# Patient Record
Sex: Female | Born: 1935 | Race: White | Hispanic: No | Marital: Married | Smoking: Never smoker
Health system: Southern US, Community
[De-identification: ages and names within clinical notes are randomized; demographics above are authoritative.]

## PROBLEM LIST (undated history)

## (undated) DIAGNOSIS — I509 Heart failure, unspecified: Secondary | ICD-10-CM

## (undated) DIAGNOSIS — I428 Other cardiomyopathies: Secondary | ICD-10-CM

## (undated) DIAGNOSIS — I5042 Chronic combined systolic (congestive) and diastolic (congestive) heart failure: Secondary | ICD-10-CM

## (undated) DIAGNOSIS — F0390 Unspecified dementia without behavioral disturbance: Secondary | ICD-10-CM

## (undated) DIAGNOSIS — E871 Hypo-osmolality and hyponatremia: Secondary | ICD-10-CM

## (undated) DIAGNOSIS — N183 Chronic kidney disease, stage 3 unspecified: Secondary | ICD-10-CM

## (undated) DIAGNOSIS — N3281 Overactive bladder: Secondary | ICD-10-CM

## (undated) DIAGNOSIS — I10 Essential (primary) hypertension: Secondary | ICD-10-CM

## (undated) DIAGNOSIS — I2510 Atherosclerotic heart disease of native coronary artery without angina pectoris: Secondary | ICD-10-CM

## (undated) DIAGNOSIS — I4891 Unspecified atrial fibrillation: Secondary | ICD-10-CM

## (undated) DIAGNOSIS — M1990 Unspecified osteoarthritis, unspecified site: Secondary | ICD-10-CM

## (undated) DIAGNOSIS — I4821 Permanent atrial fibrillation: Secondary | ICD-10-CM

## (undated) HISTORY — DX: Other cardiomyopathies: I42.8

## (undated) HISTORY — DX: Overactive bladder: N32.81

## (undated) HISTORY — PX: ABDOMINAL HYSTERECTOMY: SHX81

## (undated) HISTORY — DX: Atherosclerotic heart disease of native coronary artery without angina pectoris: I25.10

## (undated) HISTORY — DX: Hypo-osmolality and hyponatremia: E87.1

## (undated) HISTORY — DX: Unspecified atrial fibrillation: I48.91

## (undated) HISTORY — DX: Essential (primary) hypertension: I10

---

## 1978-10-02 HISTORY — PX: SPINE SURGERY: SHX786

## 1999-01-13 ENCOUNTER — Other Ambulatory Visit: Admission: RE | Admit: 1999-01-13 | Discharge: 1999-01-13 | Payer: Self-pay | Admitting: *Deleted

## 2004-03-03 ENCOUNTER — Other Ambulatory Visit: Payer: Self-pay

## 2004-11-22 ENCOUNTER — Ambulatory Visit: Payer: Self-pay | Admitting: Family Medicine

## 2005-05-16 ENCOUNTER — Ambulatory Visit: Payer: Self-pay | Admitting: Internal Medicine

## 2005-08-04 ENCOUNTER — Ambulatory Visit: Payer: Self-pay | Admitting: Family Medicine

## 2005-08-23 ENCOUNTER — Ambulatory Visit: Payer: Self-pay | Admitting: Family Medicine

## 2006-08-16 ENCOUNTER — Ambulatory Visit: Payer: Self-pay | Admitting: Family Medicine

## 2006-09-14 ENCOUNTER — Ambulatory Visit: Payer: Self-pay | Admitting: Family Medicine

## 2006-11-08 ENCOUNTER — Ambulatory Visit: Payer: Self-pay | Admitting: Family Medicine

## 2006-11-15 ENCOUNTER — Ambulatory Visit: Payer: Self-pay | Admitting: Family Medicine

## 2006-11-15 LAB — CONVERTED CEMR LAB
Alkaline Phosphatase: 63 units/L (ref 39–117)
Basophils Absolute: 0 10*3/uL (ref 0.0–0.1)
Basophils Relative: 0.6 % (ref 0.0–1.0)
Chloride: 108 meq/L (ref 96–112)
Cholesterol: 169 mg/dL (ref 0–200)
Creatinine, Ser: 0.7 mg/dL (ref 0.4–1.2)
Eosinophils Absolute: 0.5 10*3/uL (ref 0.0–0.6)
Eosinophils Relative: 7.1 % — ABNORMAL HIGH (ref 0.0–5.0)
Glucose, Bld: 91 mg/dL (ref 70–99)
Hemoglobin: 13.5 g/dL (ref 12.0–15.0)
LDL Cholesterol: 82 mg/dL (ref 0–99)
Lymphocytes Relative: 20.3 % (ref 12.0–46.0)
MCV: 91.3 fL (ref 78.0–100.0)
Monocytes Absolute: 0.6 10*3/uL (ref 0.2–0.7)
Neutrophils Relative %: 62.9 % (ref 43.0–77.0)
Potassium: 3.5 meq/L (ref 3.5–5.1)
RDW: 12.5 % (ref 11.5–14.6)
Sodium: 142 meq/L (ref 135–145)
Total Bilirubin: 0.8 mg/dL (ref 0.3–1.2)
Total Protein: 5.7 g/dL — ABNORMAL LOW (ref 6.0–8.3)
Triglycerides: 67 mg/dL (ref 0–149)
VLDL: 13 mg/dL (ref 0–40)

## 2006-11-27 ENCOUNTER — Encounter: Admission: RE | Admit: 2006-11-27 | Discharge: 2006-11-27 | Payer: Self-pay | Admitting: Family Medicine

## 2006-12-25 ENCOUNTER — Ambulatory Visit: Payer: Self-pay | Admitting: Family Medicine

## 2007-02-08 ENCOUNTER — Ambulatory Visit: Payer: Self-pay | Admitting: Family Medicine

## 2007-03-01 DIAGNOSIS — M810 Age-related osteoporosis without current pathological fracture: Secondary | ICD-10-CM

## 2007-03-01 DIAGNOSIS — I10 Essential (primary) hypertension: Secondary | ICD-10-CM | POA: Insufficient documentation

## 2007-03-01 DIAGNOSIS — M1990 Unspecified osteoarthritis, unspecified site: Secondary | ICD-10-CM

## 2007-03-04 ENCOUNTER — Ambulatory Visit: Payer: Self-pay | Admitting: Family Medicine

## 2007-08-06 ENCOUNTER — Ambulatory Visit: Payer: Self-pay | Admitting: Family Medicine

## 2007-09-04 ENCOUNTER — Telehealth (INDEPENDENT_AMBULATORY_CARE_PROVIDER_SITE_OTHER): Payer: Self-pay | Admitting: *Deleted

## 2007-09-05 ENCOUNTER — Ambulatory Visit: Payer: Self-pay | Admitting: Family Medicine

## 2007-09-05 DIAGNOSIS — R609 Edema, unspecified: Secondary | ICD-10-CM

## 2007-09-05 DIAGNOSIS — M79609 Pain in unspecified limb: Secondary | ICD-10-CM

## 2007-12-30 ENCOUNTER — Ambulatory Visit: Payer: Self-pay | Admitting: Family Medicine

## 2008-01-02 ENCOUNTER — Encounter (INDEPENDENT_AMBULATORY_CARE_PROVIDER_SITE_OTHER): Payer: Self-pay | Admitting: *Deleted

## 2008-01-09 ENCOUNTER — Encounter (INDEPENDENT_AMBULATORY_CARE_PROVIDER_SITE_OTHER): Payer: Self-pay | Admitting: *Deleted

## 2008-01-13 ENCOUNTER — Ambulatory Visit: Payer: Self-pay | Admitting: Family Medicine

## 2008-01-13 ENCOUNTER — Encounter (INDEPENDENT_AMBULATORY_CARE_PROVIDER_SITE_OTHER): Payer: Self-pay | Admitting: *Deleted

## 2008-01-20 ENCOUNTER — Telehealth (INDEPENDENT_AMBULATORY_CARE_PROVIDER_SITE_OTHER): Payer: Self-pay | Admitting: *Deleted

## 2008-01-21 ENCOUNTER — Ambulatory Visit: Payer: Self-pay | Admitting: Family Medicine

## 2008-01-21 DIAGNOSIS — L259 Unspecified contact dermatitis, unspecified cause: Secondary | ICD-10-CM | POA: Insufficient documentation

## 2008-01-23 ENCOUNTER — Encounter: Admission: RE | Admit: 2008-01-23 | Discharge: 2008-01-23 | Payer: Self-pay | Admitting: Family Medicine

## 2008-01-23 ENCOUNTER — Encounter (INDEPENDENT_AMBULATORY_CARE_PROVIDER_SITE_OTHER): Payer: Self-pay | Admitting: *Deleted

## 2008-01-28 ENCOUNTER — Encounter (INDEPENDENT_AMBULATORY_CARE_PROVIDER_SITE_OTHER): Payer: Self-pay | Admitting: *Deleted

## 2008-01-28 ENCOUNTER — Encounter: Admission: RE | Admit: 2008-01-28 | Discharge: 2008-01-28 | Payer: Self-pay | Admitting: Family Medicine

## 2008-01-28 ENCOUNTER — Encounter: Payer: Self-pay | Admitting: Family Medicine

## 2008-07-15 ENCOUNTER — Ambulatory Visit: Payer: Self-pay | Admitting: Family Medicine

## 2009-02-08 ENCOUNTER — Encounter: Admission: RE | Admit: 2009-02-08 | Discharge: 2009-02-08 | Payer: Self-pay | Admitting: Family Medicine

## 2009-04-15 ENCOUNTER — Ambulatory Visit: Payer: Self-pay | Admitting: Family Medicine

## 2009-04-18 LAB — CONVERTED CEMR LAB
Albumin: 3.7 g/dL (ref 3.5–5.2)
Alkaline Phosphatase: 54 units/L (ref 39–117)
Calcium: 9.3 mg/dL (ref 8.4–10.5)
Chloride: 105 meq/L (ref 96–112)
LDL Cholesterol: 86 mg/dL (ref 0–99)
Potassium: 3.8 meq/L (ref 3.5–5.1)
Total CHOL/HDL Ratio: 2
Triglycerides: 51 mg/dL (ref 0.0–149.0)
VLDL: 10.2 mg/dL (ref 0.0–40.0)
Vit D, 25-Hydroxy: 45 ng/mL (ref 30–89)

## 2009-04-19 ENCOUNTER — Encounter (INDEPENDENT_AMBULATORY_CARE_PROVIDER_SITE_OTHER): Payer: Self-pay | Admitting: *Deleted

## 2009-04-23 ENCOUNTER — Encounter: Payer: Self-pay | Admitting: Family Medicine

## 2009-04-23 ENCOUNTER — Encounter: Admission: RE | Admit: 2009-04-23 | Discharge: 2009-04-23 | Payer: Self-pay | Admitting: Family Medicine

## 2009-05-11 ENCOUNTER — Encounter: Payer: Self-pay | Admitting: Family Medicine

## 2009-06-24 ENCOUNTER — Telehealth: Payer: Self-pay | Admitting: Family Medicine

## 2009-06-28 ENCOUNTER — Ambulatory Visit: Payer: Self-pay | Admitting: Family Medicine

## 2009-06-28 LAB — CONVERTED CEMR LAB
Bilirubin Urine: NEGATIVE
Blood in Urine, dipstick: NEGATIVE
Ketones, urine, test strip: NEGATIVE
Nitrite: NEGATIVE
Specific Gravity, Urine: 1.01
Urobilinogen, UA: 0.2

## 2009-07-01 ENCOUNTER — Telehealth (INDEPENDENT_AMBULATORY_CARE_PROVIDER_SITE_OTHER): Payer: Self-pay | Admitting: *Deleted

## 2009-07-02 ENCOUNTER — Ambulatory Visit: Payer: Self-pay | Admitting: Family Medicine

## 2009-07-12 ENCOUNTER — Telehealth (INDEPENDENT_AMBULATORY_CARE_PROVIDER_SITE_OTHER): Payer: Self-pay | Admitting: *Deleted

## 2009-10-14 ENCOUNTER — Telehealth (INDEPENDENT_AMBULATORY_CARE_PROVIDER_SITE_OTHER): Payer: Self-pay | Admitting: *Deleted

## 2010-01-31 ENCOUNTER — Telehealth: Payer: Self-pay | Admitting: Family Medicine

## 2010-02-02 ENCOUNTER — Telehealth (INDEPENDENT_AMBULATORY_CARE_PROVIDER_SITE_OTHER): Payer: Self-pay | Admitting: *Deleted

## 2010-02-10 ENCOUNTER — Encounter: Admission: RE | Admit: 2010-02-10 | Discharge: 2010-02-10 | Payer: Self-pay | Admitting: Family Medicine

## 2010-02-10 LAB — HM MAMMOGRAPHY

## 2010-02-18 ENCOUNTER — Ambulatory Visit: Payer: Self-pay | Admitting: Family Medicine

## 2010-02-22 ENCOUNTER — Telehealth (INDEPENDENT_AMBULATORY_CARE_PROVIDER_SITE_OTHER): Payer: Self-pay | Admitting: *Deleted

## 2010-03-11 ENCOUNTER — Ambulatory Visit: Payer: Self-pay | Admitting: Family Medicine

## 2010-03-11 ENCOUNTER — Encounter (INDEPENDENT_AMBULATORY_CARE_PROVIDER_SITE_OTHER): Payer: Self-pay | Admitting: *Deleted

## 2010-03-22 ENCOUNTER — Ambulatory Visit: Payer: Self-pay | Admitting: Family Medicine

## 2010-03-22 DIAGNOSIS — K921 Melena: Secondary | ICD-10-CM

## 2010-03-23 ENCOUNTER — Encounter (INDEPENDENT_AMBULATORY_CARE_PROVIDER_SITE_OTHER): Payer: Self-pay | Admitting: *Deleted

## 2010-03-23 ENCOUNTER — Telehealth (INDEPENDENT_AMBULATORY_CARE_PROVIDER_SITE_OTHER): Payer: Self-pay | Admitting: *Deleted

## 2010-03-23 LAB — CONVERTED CEMR LAB: Fecal Occult Bld: POSITIVE

## 2010-03-29 ENCOUNTER — Telehealth: Payer: Self-pay | Admitting: Family Medicine

## 2010-05-03 ENCOUNTER — Ambulatory Visit: Payer: Self-pay | Admitting: Gastroenterology

## 2010-05-03 ENCOUNTER — Encounter (INDEPENDENT_AMBULATORY_CARE_PROVIDER_SITE_OTHER): Payer: Self-pay | Admitting: *Deleted

## 2010-05-03 DIAGNOSIS — R195 Other fecal abnormalities: Secondary | ICD-10-CM

## 2010-05-09 ENCOUNTER — Ambulatory Visit: Payer: Self-pay | Admitting: Gastroenterology

## 2010-06-27 ENCOUNTER — Ambulatory Visit: Payer: Self-pay | Admitting: Family Medicine

## 2010-06-27 ENCOUNTER — Telehealth (INDEPENDENT_AMBULATORY_CARE_PROVIDER_SITE_OTHER): Payer: Self-pay | Admitting: *Deleted

## 2010-07-06 ENCOUNTER — Telehealth (INDEPENDENT_AMBULATORY_CARE_PROVIDER_SITE_OTHER): Payer: Self-pay | Admitting: *Deleted

## 2010-08-17 ENCOUNTER — Ambulatory Visit: Payer: Self-pay | Admitting: Family Medicine

## 2010-10-23 ENCOUNTER — Encounter: Payer: Self-pay | Admitting: Family Medicine

## 2010-10-30 LAB — CONVERTED CEMR LAB
ALT: 11 units/L (ref 0–35)
AST: 19 units/L (ref 0–37)
AST: 20 units/L (ref 0–37)
Albumin: 3.5 g/dL (ref 3.5–5.2)
Albumin: 3.6 g/dL (ref 3.5–5.2)
Alkaline Phosphatase: 43 units/L (ref 39–117)
Alkaline Phosphatase: 55 units/L (ref 39–117)
BUN: 12 mg/dL (ref 6–23)
BUN: 14 mg/dL (ref 6–23)
Basophils Absolute: 0.1 10*3/uL (ref 0.0–0.1)
Basophils Absolute: 0.1 10*3/uL (ref 0.0–0.1)
Basophils Absolute: 0.1 10*3/uL (ref 0.0–0.1)
Basophils Relative: 0.7 % (ref 0.0–3.0)
Basophils Relative: 0.8 % (ref 0.0–3.0)
Bilirubin, Direct: 0.1 mg/dL (ref 0.0–0.3)
Bilirubin, Direct: 0.1 mg/dL (ref 0.0–0.3)
Blood in Urine, dipstick: NEGATIVE
CO2: 29 meq/L (ref 19–32)
Calcium: 9.3 mg/dL (ref 8.4–10.5)
Chloride: 105 meq/L (ref 96–112)
Cholesterol: 196 mg/dL (ref 0–200)
Cholesterol: 220 mg/dL — ABNORMAL HIGH (ref 0–200)
Creatinine, Ser: 0.7 mg/dL (ref 0.4–1.2)
Creatinine, Ser: 0.7 mg/dL (ref 0.4–1.2)
Creatinine, Ser: 0.7 mg/dL (ref 0.4–1.2)
Eosinophils Absolute: 0.3 10*3/uL (ref 0.0–0.7)
Eosinophils Absolute: 0.4 10*3/uL (ref 0.0–0.7)
Eosinophils Relative: 3.9 % (ref 0.0–5.0)
Eosinophils Relative: 4 % (ref 0.0–5.0)
GFR calc non Af Amer: 80.14 mL/min (ref >60)
GFR calc non Af Amer: 86 mL/min
GFR calc non Af Amer: 86.94 mL/min (ref >60)
Glucose, Bld: 100 mg/dL — ABNORMAL HIGH (ref 70–99)
Glucose, Urine, Semiquant: NEGATIVE
HDL: 87 mg/dL (ref >39.00)
Hemoglobin: 13.8 g/dL (ref 12.0–15.0)
Lymphocytes Relative: 18.1 % (ref 12.0–46.0)
Lymphocytes Relative: 18.7 % (ref 12.0–46.0)
Lymphs Abs: 1.4 10*3/uL (ref 0.7–4.0)
Lymphs Abs: 1.6 10*3/uL (ref 0.7–4.0)
MCHC: 32.4 g/dL (ref 30.0–36.0)
MCHC: 34.4 g/dL (ref 30.0–36.0)
Monocytes Absolute: 0.6 10*3/uL (ref 0.1–1.0)
Monocytes Absolute: 0.7 10*3/uL (ref 0.1–1.0)
Monocytes Relative: 8.2 % (ref 3.0–12.0)
Monocytes Relative: 8.3 % (ref 3.0–12.0)
Monocytes Relative: 8.7 % (ref 3.0–12.0)
Neutrophils Relative %: 67.8 % (ref 43.0–77.0)
Neutrophils Relative %: 68.3 % (ref 43.0–77.0)
Neutrophils Relative %: 70.1 % (ref 43.0–77.0)
Nitrite: NEGATIVE
Platelets: 287 10*3/uL (ref 150–400)
Potassium: 4 meq/L (ref 3.5–5.1)
Protein, U semiquant: NEGATIVE
RBC: 4.34 M/uL (ref 3.87–5.11)
RDW: 14.1 % (ref 11.5–14.6)
Sodium: 140 meq/L (ref 135–145)
Sodium: 142 meq/L (ref 135–145)
Sodium: 143 meq/L (ref 135–145)
Specific Gravity, Urine: <1.005
TSH: 0.45 microintl units/mL (ref 0.35–5.50)
Total Bilirubin: 0.8 mg/dL (ref 0.3–1.2)
Total CHOL/HDL Ratio: 2.4
Triglycerides: 60 mg/dL (ref 0–149)
Urobilinogen, UA: NEGATIVE
VLDL: 14.4 mg/dL (ref 0.0–40.0)
Vit D, 1,25-Dihydroxy: 36 (ref 30–89)
Vit D, 25-Hydroxy: 45 ng/mL (ref 30–89)
Vit D, 25-Hydroxy: 47 ng/mL (ref 30–89)
WBC Urine, dipstick: NEGATIVE

## 2010-11-01 NOTE — Assessment & Plan Note (Signed)
Summary: prolia injection//lch   Nurse Visit   Allergies: No Known Drug Allergies  Medication Administration  Injection # 1:    Medication: Prolia 60mg     Diagnosis: SENILE OSTEOPOROSIS (ICD-733.01)    Route: SQ    Site: R deltoid    Exp Date: 08/2011    Lot #: [id number]    Mfr: amgen    Patient tolerated injection without complications    Given by: Army Fossa CMA (Feb 18, 2010 1:50 PM)  Orders Added: 1)  Admin of Therapeutic Inj  intramuscular or subcutaneous [96372] 2)  Prolia 60mg  [J3590]

## 2010-11-01 NOTE — Letter (Signed)
Summary: [facility] Instructions  [facility]  [address] [place], [place] [address]   Phone: [phone]  Fax: [phone]       [name]    [birth year]    MRN: [id number]      Procedure Day Dorna Bloom:  Duanne Limerick, 05/09/10     Arrival Time: 3:00 PM      Procedure Time: 4:00 PM    Location of Procedure:                    _X_  [facility] (4th Floor)  PREPARATION FOR COLONOSCOPY WITH MOVIPREP   Starting 5 days prior to your procedure 05/04/10 do not eat nuts, seeds, popcorn, corn, beans, peas,  salads, or any raw vegetables.  Do not take any fiber supplements (e.g. Metamucil, Citrucel, and Benefiber).  THE DAY BEFORE YOUR PROCEDURE         SUNDAY, 05/08/10  1.  Drink clear liquids the entire day-NO SOLID FOOD  2.  Do not drink anything colored red or purple.  Avoid juices with pulp.  No orange juice.  3.  Drink at least 64 oz. (8 glasses) of fluid/clear liquids during the day to prevent dehydration and help the prep work efficiently.  CLEAR LIQUIDS INCLUDE: Water Jello Ice Popsicles Tea (sugar ok, no milk/cream) Powdered fruit flavored drinks Coffee (sugar ok, no milk/cream) Gatorade Juice: apple, white grape, white cranberry  Lemonade Clear bullion, consomm, broth Carbonated beverages (any kind) Strained chicken noodle soup Hard Candy                             4.  In the morning, mix first dose of MoviPrep solution:    Empty 1 Pouch A and 1 Pouch B into the disposable container    Add lukewarm drinking water to the top line of the container. Mix to dissolve    Refrigerate (mixed solution should be used within 24 hrs)  5.  Begin drinking the prep at 5:00 p.m. The MoviPrep container is divided by 4 marks.   Every 15 minutes drink the solution down to the next mark (approximately 8 oz) until the full liter is complete.   6.  Follow completed prep with 16 oz of clear liquid of your choice (Nothing red or purple).  Continue to drink clear  liquids until bedtime.  7.  Before going to bed, mix second dose of MoviPrep solution:    Empty 1 Pouch A and 1 Pouch B into the disposable container    Add lukewarm drinking water to the top line of the container. Mix to dissolve    Refrigerate  THE DAY OF YOUR PROCEDURE      MONDAY, 05/09/10  Beginning at 11:00 a.m. (5 hours before procedure):         1. Every 15 minutes, drink the solution down to the next mark (approx 8 oz) until the full liter is complete.  2. Follow completed prep with 16 oz. of clear liquid of your choice.    3. You may drink clear liquids until 2:00 PM (2 HOURS BEFORE PROCEDURE).  MEDICATION INSTRUCTIONS  Unless otherwise instructed, you should take regular prescription medications with a small sip of water   as early as possible the morning of your procedure.       OTHER INSTRUCTIONS  You will need a responsible adult at least 75 years of age to accompany you and drive you home.  This person must remain in the waiting room during your procedure.  Wear loose fitting clothing that is easily removed.  Leave jewelry and other valuables at home.  However, you may wish to bring a book to read or  an iPod/MP3 player to listen to music as you wait for your procedure to start.  Remove all body piercing jewelry and leave at home.  Total time from sign-in until discharge is approximately 2-3 hours.  You should go home directly after your procedure and rest.  You can resume normal activities the  day after your procedure.  The day of your procedure you should not:   Drive   Make legal decisions   Operate machinery   Drink alcohol   Return to work  You will receive specific instructions about eating, activities and medications before you leave.  The above instructions have been reviewed and explained to me by   Francee Piccolo, CMA (AAMA)    I fully understand and can verbalize these instructions _____________________________ Date  _________

## 2010-11-01 NOTE — Procedures (Signed)
Summary: Colonoscopy  Patient: [name] Note: All result statuses are Final unless otherwise noted.  Tests: (1) Colonoscopy (COL)   COL Colonoscopy           DONE     [facility]     [address]     [place], [place]  [address]           COLONOSCOPY PROCEDURE REPORT           PATIENT:  [name], [name]  MR#:  [id number]     BIRTHDATE:  21-Sep-1928, 82 yrs. old  GENDER:  female     ENDOSCOPIST:  Rachael Fee, MD     REF. BY:  [name], DO     PROCEDURE DATE:  05/09/2010     PROCEDURE:  Diagnostic Colonoscopy     ASA CLASS:  Class II     INDICATIONS:  FOBT positive stool     MEDICATIONS:   Fentanyl 25 mcg IV, Versed 4 mg IV           DESCRIPTION OF PROCEDURE:   After the risks benefits and     alternatives of the procedure were thoroughly explained, informed     consent was obtained.  Digital rectal exam was performed and     revealed no rectal masses.   The LB [id number]-[id number] endoscope     was introduced through the anus and advanced to the cecum, which     was identified by both the appendix and ileocecal valve, without     limitations.  The quality of the prep was good, using MoviPrep.     The instrument was then slowly withdrawn as the colon was fully     examined.     <<PROCEDUREIMAGES>>           FINDINGS:  Moderate diverticulosis was found in the sigmoid to     descending colon segments (see image1).  Internal and external     hemorrhoids were found.  This was otherwise a normal examination     of the colon (see image2, image3, and image4).   Retroflexed views     in the rectum revealed no abnormalities.    The scope was then     withdrawn from the patient and the procedure completed.           COMPLICATIONS:  None     ENDOSCOPIC IMPRESSION:     1) Moderate diverticulosis in the sigmoid to descending colon     segments     2) Internal and external hemorrhoids     3) Otherwise normal examination.  No polyps or cancers.            RECOMMENDATIONS:     1) Given your age, you will not need another colonoscopy for     colon cancer screening or polyp surveillance. These types of tests     usually stop around the age 41.           ______________________________     Rachael Fee, MD           n.     eSIGNED:   Rachael Fee at 05/09/2010 04:05 PM           [name], [name], [id number]  Note: An exclamation mark (!) indicates a result that was not dispersed into the flowsheet. Document Creation Date: 05/09/2010 4:07 PM _______________________________________________________________________  (1) Order result status: Final Collection or observation date-time: 05/09/2010 16:02 Requested date-time:  Receipt  date-time:  Reported date-time:  Referring Physician:   Ordering Physician: Rob Bunting [phone]) Specimen Source:  Source: Launa Grill Order Number: [id number] Lab site:

## 2010-11-01 NOTE — Assessment & Plan Note (Signed)
Summary: yearly check/cbs   Vital Signs:  Patient profile:   77 year old female Height:      64 inches Weight:      127.38 pounds BMI:     21.94 Temp:     97.9 degrees F oral Pulse rate:   76 / minute Pulse rhythm:   regular BP sitting:   118 / 76  (left arm) Cuff size:   regular  Vitals Entered By: Army Fossa CMA (March 11, 2010 9:00 AM) CC: Pt here yearly check. Is Patient Diabetic? No Pain Assessment Patient in pain? yes     Location: knee Intensity: 8 Type: aching Onset of pain  Chronic Nutritional Status BMI of 19 -24 = normal  Does patient need assistance? Functional Status Self care, Cook/clean, Shopping, Social activities Ambulation Impaired:Risk for fall Comments secondary to osteoporosis pt walks hunched over--- at risk for tripping and falling   History of Present Illness: Pt here for cpe and labs.  Pt is having OA pain but walks the dog.   Hypertension follow-up      This is an 77 year old woman who presents for Hypertension follow-up.  The patient denies lightheadedness, urinary frequency, headaches, edema, impotence, rash, and fatigue.  The patient denies the following associated symptoms: chest pain, chest pressure, exercise intolerance, dyspnea, palpitations, syncope, leg edema, and pedal edema.  Compliance with medications (by patient report) has been near 100%.  The patient reports that dietary compliance has been good.  The patient reports no exercise.  Adjunctive measures currently used by the patient include salt restriction.    Preventive Screening-Counseling & Management  Alcohol-Tobacco     Alcohol drinks/day: <1     Alcohol type: occas  wine     Smoking Status: never     Passive Smoke Exposure: no  Caffeine-Diet-Exercise     Caffeine use/day: 4     Does Patient Exercise: yes     Type of exercise: walks     Times/week: <3     Depression Counseling: not indicated; screening negative for depression  Hep-HIV-STD-Contraception     HIV  Risk: no     Dental Visit-last 6 months dentures     SBE monthly: yes  Safety-Violence-Falls     Seat Belt Use: 100     Smoke Detectors: yes     Violence in the Home: no risk noted     Sexual Abuse: no     Fall Risk: concerned about pt getting tired and falling      Drug Use:  never.    Current Medications (verified): 1)  Norvasc 5 Mg  Tabs (Amlodipine Besylate) .... Take One Tablet By Mouth Daily**office Visit Due Now** 2)  Caltrate 600 1500 Mg Tabs (Calcium Carbonate) .Marland Kitchen.. 1 By Mouth Two Times A Day 3)  Vesicare 5 Mg Tabs (Solifenacin Succinate) .Marland Kitchen.. 1 By Mouth Once Daily 4)  Prolia 60 Mg/ml Soln (Denosumab) .... 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 Family History Depression Family History Heart Attack Family History of CAD Female 1st degree relative M-69 Son-- MI in 87 s  Social History: Reviewed history from 12/30/2007 and no changes required. 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 Eyes:  Denies blurring, discharge, double vision, eye irritation, eye pain, halos, itching, light sensitivity, red eye, vision loss-1 eye, and vision loss-both eyes; + Optho q1y. ENT:  Complains of decreased hearing; denies difficulty swallowing, ear discharge, earache, hoarseness, nasal congestion, nosebleeds, postnasal drainage, ringing in ears, sinus pressure, and sore throat; + hearing aids . CV:  Denies bluish discoloration of lips or nails, chest pain or discomfort, difficulty breathing at night, difficulty breathing while lying down, fainting, fatigue, leg cramps with exertion, lightheadness, near fainting, palpitations, shortness of breath with exertion, swelling of hands, and weight gain; pt states swelling improved since sitting with legs up. Resp:  Denies chest discomfort, chest pain with inspiration, cough, coughing up blood, excessive snoring, hypersomnolence, morning headaches, pleuritic, shortness of breath, sputum productive, and wheezing. GI:  Denies abdominal pain, bloody stools, change in bowel habits, constipation, dark tarry stools, diarrhea, excessive appetite, gas, hemorrhoids, indigestion, loss of appetite, and nausea. GU:  Denies abnormal vaginal bleeding, decreased libido, discharge, dysuria, genital sores, hematuria, incontinence, nocturia, urinary frequency, and urinary hesitancy. MS:  Complains of joint pain; denies joint redness, joint swelling, loss of strength, low back pain, mid back pain, muscle aches, muscle , cramps, muscle weakness, stiffness, and thoracic pain; pt c/o b/l knee pain secondary to oA. Derm:  Denies changes in color of skin, changes in nail beds, dryness, excessive perspiration, flushing, hair loss, insect bite(s), itching, lesion(s), poor wound healing, and  rash. Neuro:  Denies brief paralysis, difficulty with concentration, disturbances in coordination, falling down, headaches, inability to speak, memory loss, numbness, poor balance, seizures, sensation of room spinning, tingling, tremors, and visual disturbances. Psych:  Denies alternate hallucination ( auditory/visual), anxiety, depression, easily angered, easily tearful, irritability, mental problems, panic attacks, sense of great danger, suicidal thoughts/plans, thoughts of violence, unusual visions or sounds, and thoughts /plans of harming others. Endo:  Denies cold intolerance, excessive hunger, excessive thirst, excessive urination, heat intolerance, polyuria, and weight change. Heme:  Denies abnormal bruising, bleeding, enlarge lymph nodes, fevers, pallor, and skin discoloration. Allergy:  Denies hives or rash, itching eyes, persistent infections, seasonal allergies, and sneezing.   Physical Exam  General:  Well-developed,well-nourished,in no acute distress; alert,appropriate and cooperative throughout examination Head:  Normocephalic and atraumatic without obvious abnormalities. No apparent alopecia or balding. Eyes:  pupils equal, pupils round, pupils reactive to light, and no injection.   + glasses-- pt sees optho q1y Ears:  + hearing aids --hearing normal with whisper from 6 feet Nose:  External nasal examination shows no deformity or inflammation. Nasal mucosa are pink and moist without lesions or exudates. Mouth:  Oral mucosa and oropharynx without lesions or exudates.  Teeth in good repair. Neck:  No deformities, masses, or tenderness noted.no carotid bruits.   Chest Wall:  No deformities, masses, or tenderness  noted. Breasts:  No mass, nodules, thickening, tenderness, bulging, retraction, inflamation, nipple discharge or skin changes noted.   Lungs:  Normal respiratory effort, chest expands symmetrically. Lungs are clear to auscultation, no crackles or wheezes. Heart:  normal rate  and no murmur.   Abdomen:  Bowel sounds positive,abdomen soft and non-tender without masses, organomegaly or hernias noted. Rectal:  refused-- ifob given Genitalia:  refused Msk:  b/l knee painno joint warmth and no redness over joints.   Pulses:  R posterior tibial normal, R dorsalis pedis normal, R carotid normal, L posterior tibial normal, L dorsalis pedis normal, and L carotid normal.   Extremities:  No clubbing, cyanosis, edema, or deformity noted with normal full range of motion of all joints.   Neurologic:  alert & oriented X3 and cranial nerves II-XII intact. Pt walks bent over --with no cane/ walker some weakness in legs but othewise good strength   Skin:  Intact without suspicious lesions or rashes Cervical Nodes:  No lymphadenopathy noted Axillary Nodes:  No palpable lymphadenopathy Psych:  Oriented X3 and normally interactive.     Impression & Recommendations:  Problem # 1:  PREVENTIVE HEALTH CARE (ICD-V70.0)  Orders: Venipuncture (16109) TLB-Lipid Panel (80061-LIPID) TLB-BMP (Basic Metabolic Panel-BMET) (80048-METABOL) TLB-CBC Platelet - w/Differential (85025-CBCD) TLB-Hepatic/Liver Function Pnl (80076-HEPATIC) T-Vitamin D (25-Hydroxy) (60454-09811) UA Dipstick w/o Micro (manual) (91478) First annual wellness visit with prevention plan  (G9562) EKG w/ Interpretation (93000)  Problem # 2:  SENILE OSTEOPOROSIS (ICD-733.01)  Her updated medication list for this problem includes:    Caltrate 600 1500 Mg Tabs (Calcium carbonate) .Marland Kitchen... 1 by mouth two times a day    Prolia 60 Mg/ml Soln (Denosumab) ..... Q 6 months  Orders: Venipuncture (13086) TLB-Lipid Panel (80061-LIPID) TLB-BMP (Basic Metabolic Panel-BMET) (80048-METABOL) TLB-CBC Platelet - w/Differential (85025-CBCD) TLB-Hepatic/Liver Function Pnl (80076-HEPATIC) T-Vitamin D (25-Hydroxy) (57846-96295)  Bone Density: abnormal-- osteoporosis (05/11/2009) Vit D:45 (04/15/2009), 36 (12/30/2007)  Problem # 3:   OSTEOARTHRITIS (ICD-715.90)  Orders: Venipuncture (28413) TLB-Lipid Panel (80061-LIPID) TLB-BMP (Basic Metabolic Panel-BMET) (80048-METABOL) TLB-CBC Platelet - w/Differential (85025-CBCD) TLB-Hepatic/Liver Function Pnl (80076-HEPATIC) T-Vitamin D (25-Hydroxy) (24401-02725)  Discussed use of medications, application of heat or cold, and exercises.   Problem # 4:  HYPERTENSION (ICD-401.9)  Her updated medication list for this problem includes:    Norvasc 5 Mg Tabs (Amlodipine besylate) .Marland Kitchen... Take one tablet by mouth daily**office visit due now**  Orders: Venipuncture (36644) TLB-Lipid Panel (80061-LIPID) TLB-BMP (Basic Metabolic Panel-BMET) (80048-METABOL) TLB-CBC Platelet - w/Differential (85025-CBCD) TLB-Hepatic/Liver Function Pnl (80076-HEPATIC) T-Vitamin D (25-Hydroxy) (03474-25956)  BP today: 118/76 Prior BP: 130/78 (07/02/2009)  Labs Reviewed: K+: 3.8 (04/15/2009) Creat: : 0.8 (04/15/2009)   Chol: 184 (04/15/2009)   HDL: 87.80 (04/15/2009)   LDL: 86 (04/15/2009)   TG: 51.0 (04/15/2009)  Complete Medication List: 1)  Norvasc 5 Mg Tabs (Amlodipine besylate) .... Take one tablet by mouth daily**office visit due now** 2)  Caltrate 600 1500 Mg Tabs (Calcium carbonate) .Marland Kitchen.. 1 by mouth two times a day 3)  Vesicare 5 Mg Tabs (Solifenacin succinate) .Marland Kitchen.. 1 by mouth once daily 4)  Prolia 60 Mg/ml Soln (Denosumab) .... Q 6 months   EKG  Procedure date:  03/11/2010  Findings:      Normal sinus rhythm with rate of:  67 bpm    PAP Next Due:  Refused Bone Density Result Date:  05/11/2009 Bone Density Result:  abnormal-- osteoporosis Bone Density Next Due: 2 yr     Prevention & Chronic Care Immunizations   Influenza vaccine: Fluvax  MCR  (06/28/2009)   Influenza vaccine due: 06/28/2010    Tetanus booster: 10/02/2005: Td   Tetanus booster due: 10/03/2015    Pneumococcal vaccine: Pneumovax  (11/08/2006)   Pneumococcal vaccine due: None    H. zoster vaccine:  12/12/2006: Zostavax  Colorectal Screening   Hemoccult: Not documented   Hemoccult action/deferral: Ordered  (03/11/2010)    Colonoscopy: normal per pt--  [facility]  (12/14/2004)   Colonoscopy due: 12/15/2014  Other Screening   Pap smear: Not documented   Pap smear action/deferral: Refused  (03/11/2010)   Pap smear due: Refused  (03/11/2010)    Mammogram: ASSESSMENT: Negative - BI-RADS 1^MM DIGITAL SCREENING  (02/10/2010)   Mammogram due: 02/11/2011    DXA bone density scan: abnormal-- osteoporosis  (05/11/2009)   DXA scan due: 05/12/2011    Smoking status: never  (03/11/2010)  Lipids   Total Cholesterol: 184  (04/15/2009)   LDL: 86  (04/15/2009)   LDL Direct: Not documented   HDL: 87.80  (04/15/2009)   Triglycerides: 51.0  (04/15/2009)  Hypertension   Last Blood Pressure: 118 / 76  (03/11/2010)   Serum creatinine: 0.8  (04/15/2009)   BMP action: Ordered   Serum potassium 3.8  (04/15/2009)    Hypertension flowsheet reviewed?: Yes   Progress toward BP goal: At goal  Self-Management Support :    Patient will work on the following items until the next clinic visit to reach self-care goals:     Medications and monitoring: take my medicines every day, bring all of my medications to every visit  (03/11/2010)     Eating: drink diet soda or water instead of juice or soda, eat more vegetables, use fresh or frozen vegetables, eat foods that are low in salt, eat baked foods instead of fried foods, eat fruit for snacks and desserts, limit or avoid alcohol  (03/11/2010)     Activity: take a 30 minute walk every day  (03/11/2010)    Hypertension self-management support: Not documented  Laboratory Results   Urine Tests   Date/Time Reported: March 11, 2010 11:18 AM   Routine Urinalysis   Color: yellow Appearance: Clear Glucose: negative   (Normal Range: Negative) Bilirubin: negative   (Normal Range: Negative) Ketone: negative   (Normal Range: Negative) Spec.  Gravity: <1.005   (Normal Range: 1.003-1.035) Blood: negative   (Normal Range: Negative) pH: 7.5   (Normal Range: 5.0-8.0) Protein: negative   (Normal Range: Negative) Urobilinogen: negative   (Normal Range: 0-1) Nitrite: negative   (Normal Range: Negative) Leukocyte Esterace: negative   (Normal Range: Negative)    Comments: Floydene Flock  March 11, 2010 11:19 AM       Appended Document: Orders Update     Clinical Lists Changes  Orders: Added new Service order of Prescription Created Electronically [phone]) - Signed

## 2010-11-01 NOTE — Assessment & Plan Note (Signed)
History of Present Illness Visit Type: consult  Primary GI MD: Rob Bunting MD Primary Provider: [name], DO  Requesting Provider: [name], DO  Chief Complaint: Heme positive stool  History of Present Illness:     very pleasant 77 year old woman who has been taking vesicare.  This causes constipation.  Severe, per patient.  Miralax helps, she stopped the vesicare.   At it's worst she would go once a week.  She's had no overt GI bleeding.   she had a recent routine physical and was found to have Hemoccult positive stool.  recent CBC shows that she is not anemic  She had a colonoscopy about 5-6 years ago.  This was probably at [facility].  Everything was "fine."  Dr. Maryruth Bun was the GI MD.           Current Medications (verified): 1)  Norvasc 5 Mg  Tabs (Amlodipine Besylate) .... Take One Tablet By Mouth Daily**office Visit Due Now** 2)  Caltrate 600 1500 Mg Tabs (Calcium Carbonate) .Marland Kitchen.. 1 By Mouth Two Times A Day 3)  Prolia 60 Mg/ml Soln (Denosumab) .... 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  All other review of systems was otherwise negative.   Vital Signs:  Patient profile:   77 year old female Height:      64 inches Weight:      122 pounds BMI:     21.02 BSA:     1.59 Pulse rate:   68 / minute Pulse rhythm:   regular BP sitting:   122 / 64   (left arm) Cuff size:   regular  Vitals Entered By: Ok Anis CMA (May 03, 2010 9:45 AM)  Physical Exam  Additional Exam:  Constitutional: generally well appearing Psychiatric: alert and oriented times 3 Eyes: extraocular movements intact Mouth: oropharynx moist, no lesions Neck: supple, no lymphadenopathy Cardiovascular: heart regular rate and rythm Lungs: CTA bilaterally Abdomen: soft, non-tender, non-distended, no obvious ascites, no peritoneal signs, normal bowel sounds Extremities: no lower extremity edema bilaterally Skin: no lesions on visible extremities    Impression & Recommendations:  Problem # 1:  Hemoccult-Positive stool we will proceed with colonoscopy at her soonest convenience.  Colorectal cancer screening usually stops around the age of 30 and so pending the results of the colonoscopy she may not need any further screening. I see no reason for any further blood tests or imaging studies at this point.  Patient Instructions: 1)  You will be scheduled to have a colonoscopy. 2)  A copy of this information will be sent to Dr. Laury Axon. 3)  The medication list was reviewed and reconciled.  All changed / newly prescribed medications were explained.  A complete medication list was provided to the patient / caregiver.  Appended Document: Orders Update Clinical Lists Changes  Problems: Added new problem of BLOOD IN STOOL, OCCULT (ICD-792.1) - Signed Medications: Added new medication  of MOVIPREP 100 GM  SOLR (PEG-KCL-NACL-NASULF-NA ASC-C) As per prep instructions. - Signed Rx of MOVIPREP 100 GM  SOLR (PEG-KCL-NACL-NASULF-NA ASC-C) As per prep instructions.;  #1 x 0;  Signed;  Entered by: Francee Piccolo CMA (AAMA);  Authorized by: Rachael Fee MD;  Method used: [facility] [phone]*, [address], [place], [place], [place]  [address], Ph: [phone], Fax: [phone] Orders: Added new Test order of Colonoscopy (Colon) -  Signed    Prescriptions: MOVIPREP 100 GM  SOLR (PEG-KCL-NACL-NASULF-NA ASC-C) As per prep instructions.  #1 x 0   Entered by:   Francee Piccolo CMA (AAMA)   Authorized by:   Rachael Fee MD   Signed by:   Francee Piccolo CMA (AAMA) on 05/03/2010   Method used:   Electronically to        [facility] [phone]* (retail)       [address]       [place], [place]  [address]       Ph: [phone]       Fax: [phone]   RxID:   [id number]

## 2010-11-01 NOTE — Progress Notes (Signed)
Summary: Prior Auth-needs to Detrol alternative  Phone Note Refill Request Call back at [phone] Message from:  Pharmacy on June 27, 2010 10:05 AM  Refills Requested: Medication #1:  DETROL LA 2 MG XR24H-CAP 1 by mouth once daily   Dosage confirmed as above?Dosage Confirmed [facility] [phone]  Initial call taken by: Harold Barban,  June 27, 2010 10:05 AM  Follow-up for Phone Call        patient called says that medication is $175 so she will need alternative medication list of alternatives are  oxybutynin er,enablex,vesicare,oxytrol .......Marland KitchenDoristine Devoid CMA  June 27, 2010 1:47 PM   Additional Follow-up for Phone Call Additional follow up Details #1::        oxybutynun xl 5 1 by mouth once daily #30  5 refills Additional Follow-up by: [name] DO,  June 27, 2010 2:18 PM    Additional Follow-up for Phone Call Additional follow up Details #2::    spoke w/ patient aware prescription changed and sent to pharmacy.....Marland KitchenMarland KitchenDoristine Devoid CMA  June 27, 2010 2:43 PM   New/Updated Medications: OXYBUTYNIN CHLORIDE 5 MG XR24H-TAB (OXYBUTYNIN CHLORIDE) take one tablet daily Prescriptions: OXYBUTYNIN CHLORIDE 5 MG XR24H-TAB (OXYBUTYNIN CHLORIDE) take one tablet daily  #30 x 5   Entered by:   Doristine Devoid CMA   Authorized by:   [name] DO   Signed by:   Doristine Devoid CMA on 06/27/2010   Method used:   Electronically to        [facility] [phone]* (retail)       [address]       [place], [place]  [address]       Ph: [phone]       Fax: [phone]   RxID:   [id number]

## 2010-11-01 NOTE — Progress Notes (Signed)
Summary: Lab Results  Phone Note Outgoing Call   Call placed by: Army Fossa CMA,  March 23, 2010 10:23 AM Reason for Call: Discuss lab or test results Summary of Call: Regarding results from [employer], LMTCB:  + for blood----- refer to GI  Follow-up for Phone Call        Marisue Ivan gave pt info when she gave her the info for the referral. Army Fossa CMA  March 23, 2010 1:52 PM

## 2010-11-01 NOTE — Progress Notes (Signed)
Summary: Refill Request  Phone Note Refill Request Message from:  Patient on Feb 22, 2010 8:23 AM  Refills Requested: Medication #1:  NORVASC 5 MG  TABS TAKE ONE TABLET BY MOUTH DAILY**OFFICE VISIT DUE NOW**   Dosage confirmed as above?Dosage Confirmed   Brand Name Necessary? No   [facility]ly Requested: [facility] month [facility] [address]   Method Requested: Pick up at [facility] Next Appointment Scheduled: 6.10.11 Initial call taken by: Harold Barban,  Feb 22, 2010 8:23 AM    Prescriptions: NORVASC 5 MG  TABS (AMLODIPINE BESYLATE) TAKE ONE TABLET BY MOUTH DAILY**OFFICE VISIT DUE NOW**  #30 x 0   Entered by:   Army Fossa CMA   Authorized by:   [name] DO   Signed by:   Army Fossa CMA on 02/22/2010   Method used:   Reprint   RxID:   [id number] NORVASC 5 MG  TABS (AMLODIPINE BESYLATE) TAKE ONE TABLET BY MOUTH DAILY**OFFICE VISIT DUE NOW**  #30 x 0   Entered by:   Army Fossa CMA   Authorized by:   [name] DO   Signed by:   Army Fossa CMA on 02/22/2010   Method used:   Print then Give to Patient   RxID:   [id number]  cancelled refill thta was sent in, pt states pharmacy did not receive. Army Fossa CMA  Feb 22, 2010 8:28 AM

## 2010-11-01 NOTE — Assessment & Plan Note (Signed)
Summary: YEARLY EXAM AND FASTING LABS///SPH   Vital Signs:  Patient profile:   77 year old female Height:      63 inches Weight:      122.6 pounds BMI:     21.80 Temp:     97.9 degrees F oral Pulse rate:   76 / minute Pulse rhythm:   regular BP sitting:   130 / 70  (right arm)  Vitals Entered By: Almeta Monas CMA Duncan Dull) (June 27, 2010 9:01 AM) CC: CPX/FASTING, WANTS TO DISCUSS MEDS Nutritional Status BMI of 19 -24 = normal  Does patient need assistance? Functional Status Self care, Cook/clean, Shopping, Social activities Ambulation Impaired:Risk for fall Comments Pt walks with walking stick Pt able to do all ADLs and can read and write   Vision Screening:      Vision Comments: +optho--wears glasses 40db HL: Left  Right  Audiometry Comment: improved with hearing aids b/l     History of Present Illness: Pt here for cpe and labs. No complaints.    Preventive Screening-Counseling & Management  Alcohol-Tobacco     Alcohol drinks/day: <1     Alcohol type: occas  wine     Smoking Status: never     Passive Smoke Exposure: no  Caffeine-Diet-Exercise     Caffeine use/day: 4     Does Patient Exercise: yes     Type of exercise: walks     Times/week: <3     Depression Counseling: not indicated; screening negative for depression  Hep-HIV-STD-Contraception     HIV Risk: no     Dental Visit-last 6 months dentures     SBE monthly: yes  Safety-Violence-Falls     Seat Belt Use: 100     Smoke Detectors: yes     Violence in the Home: no risk noted     Sexual Abuse: no     Sexual Abuse Counseling: no     Fall Risk: concerned about pt getting tired and falling     Fall Risk Counseling: counseling provided; falls with injury noted  Current Medications (verified): 1)  Norvasc 5 Mg  Tabs (Amlodipine Besylate) .... Take One Tablet By Mouth Daily 2)  Caltrate 600 1500 Mg Tabs (Calcium Carbonate) .Marland Kitchen.. 1 By Mouth Two Times A Day 3)  Prolia 60 Mg/ml Soln (Denosumab)  .... Q 6 Months 4)  Aspir-Low 81 Mg Tbec (Aspirin) .... By Mouth Once Daily 5)  Fish Oil 1000 Mg Caps (Omega-3 Fatty Acids) .Marland Kitchen.. 1by Mouth Once Daily 6)  Miralax  Powd (Polyethylene Glycol 3350) .... As Directed As Needed 7)  Detrol La 2 Mg Xr24h-Cap (Tolterodine Tartrate) .Marland Kitchen.. 1 By Mouth Once Daily 8)  Vitamin E 1000 Unit Caps (Vitamin E) .Marland Kitchen.. 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 Family History Depression Family History  Heart Attack Family History of CAD Female 1st degree relative M-69 Son-- MI in 13 s   Social History: Reviewed history from 05/03/2010 and no changes required. 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 Eyes:  Denies blurring, discharge, double vision, eye irritation, eye pain, halos, itching, light sensitivity, red eye, vision loss-1 eye, and vision loss-both eyes; optho.  Physical  Exam  General:  Well-developed,well-nourished,in no acute distress; alert,appropriate and cooperative throughout examination Head:  Normocephalic and atraumatic without obvious abnormalities. No apparent alopecia or balding. Eyes:  pupils equal, pupils round, pupils reactive to light, and no injection.   Ears:  b/l hearing laidsR ear normal, L ear normal, and no external deformities.   Nose:  External nasal examination shows no deformity or inflammation. Nasal mucosa are pink and moist without lesions or exudates. Mouth:  Oral mucosa and oropharynx without lesions or exudates.  Teeth in good repair. Neck:  No deformities, masses, or tenderness noted.normal carotid upstroke.   Chest Wall:  No deformities, masses, or tenderness noted. Breasts:  No mass, nodules, thickening, tenderness, bulging, retraction, inflamation, nipple discharge or skin changes noted.   Lungs:  Normal respiratory effort, chest expands symmetrically. Lungs are clear to auscultation, no crackles or wheezes. Heart:  normal rate and no murmur.   Abdomen:  Bowel sounds positive,abdomen soft and non-tender without masses, organomegaly or hernias noted. Rectal:  refused Genitalia:  refused Msk:  normal ROM, no joint tenderness,  no joint warmth, and no redness over joints.  + swelling and deformity in ankes;feet Pulses:  R posterior tibial normal, R dorsalis pedis normal, R carotid normal, L posterior tibial normal, L dorsalis pedis normal, and L carotid normal.   Extremities:  No clubbing, cyanosis, edema, or deformity noted with normal full range of motion of all joints.   Neurologic:  No cranial nerve deficits noted. Station and gait are normal. Plantar reflexes are down-going bilaterally. DTRs are symmetrical throughout. Sensory, motor and coordinative functions appear intact. Skin:  Intact without suspicious lesions or rashes Cervical Nodes:  No lymphadenopathy noted Axillary Nodes:  No palpable lymphadenopathy Psych:   Cognition and judgment appear intact. Alert and cooperative with normal attention span and concentration. No apparent delusions, illusions, hallucinationsnot suicidal and not homicidal.     Impression & Recommendations:  Problem # 1:  PREVENTIVE HEALTH CARE (ICD-V70.0) GHM utd  Orders: Venipuncture ([address]) TLB-Lipid Panel (80061-LIPID) TLB-BMP (Basic Metabolic Panel-BMET) (80048-METABOL) TLB-CBC Platelet - w/Differential (85025-CBCD) TLB-Hepatic/Liver Function Pnl (80076-HEPATIC) T-Vitamin D (25-Hydroxy) (60454-09811) Medicare -1st Annual Wellness Visit [phone]) EKG w/ Interpretation ([address])  Problem # 2:  SENILE OSTEOPOROSIS (ICD-733.01)  Her updated medication list for this problem includes:    Caltrate 600 1500 Mg Tabs (Calcium carbonate) .Marland Kitchen... 1 by mouth two times a day    Prolia 60 Mg/ml Soln (Denosumab) ..... Q 6 months  Orders: Venipuncture (29562) TLB-Lipid Panel (80061-LIPID) TLB-BMP (Basic Metabolic Panel-BMET) (80048-METABOL) TLB-CBC Platelet - w/Differential (85025-CBCD) TLB-Hepatic/Liver Function Pnl (80076-HEPATIC) T-Vitamin D (25-Hydroxy) (13086-57846)  Bone Density: abnormal-- osteoporosis (05/11/2009) Vit D:45 (03/11/2010), 45 (04/15/2009)  Problem # 3:  OVERACTIVE BLADDER (ICD-596.51) detrol la   Problem # 4:  OSTEOPOROSIS (ICD-733.00)  Her updated medication list for this problem includes:    Caltrate 600 1500 Mg Tabs (Calcium carbonate) .Marland Kitchen... 1 by mouth  two times a day    Prolia 60 Mg/ml Soln (Denosumab) ..... Q 6 months  Orders: Venipuncture (16[address]) TLB-Lipid Panel (80061-LIPID) TLB-BMP (Basic Metabolic Panel-BMET) (80048-METABOL) TLB-CBC Platelet - w/Differential (85025-CBCD) TLB-Hepatic/Liver Function Pnl (80076-HEPATIC) T-Vitamin D (25-Hydroxy) (60454-09811)  Bone Density: abnormal-- osteoporosis (05/11/2009) Vit D:45 (03/11/2010), 45 (04/15/2009)  Problem # 5:  OSTEOARTHRITIS (ICD-715.90)  Her updated medication list for this problem  includes:    Aspir-low 81 Mg Tbec (Aspirin) ..... By mouth once daily  Discussed use of medications, application of heat or cold, and exercises.   Problem # 6:  HYPERTENSION (ICD-401.9)  Her updated medication list for this problem includes:    Norvasc 5 Mg Tabs (Amlodipine besylate) .Marland Kitchen... Take one tablet by mouth daily  Orders: Venipuncture (91478) TLB-Lipid Panel (80061-LIPID) TLB-BMP (Basic Metabolic Panel-BMET) (80048-METABOL) TLB-CBC Platelet - w/Differential (85025-CBCD) TLB-Hepatic/Liver Function Pnl (80076-HEPATIC) T-Vitamin D (25-Hydroxy) (29562-13086)  BP today: 130/70 Prior BP: 122/64 (05/03/2010)  Labs Reviewed: K+: 4.1 (03/11/2010) Creat: : 0.7 (03/11/2010)   Chol: 220 (03/11/2010)   HDL: 90.40 (03/11/2010)   LDL: 86 (04/15/2009)   TG: 72.0 (03/11/2010)  Complete Medication List: 1)  Norvasc 5 Mg Tabs (Amlodipine besylate) .... Take one tablet by mouth daily 2)  Caltrate 600 1500 Mg Tabs (Calcium carbonate) .Marland Kitchen.. 1 by mouth two times a day 3)  Prolia 60 Mg/ml Soln (Denosumab) .... Q 6 months 4)  Aspir-low 81 Mg Tbec (Aspirin) .... By mouth once daily 5)  Fish Oil 1000 Mg Caps (Omega-3 fatty acids) .Marland Kitchen.. 1by mouth once daily 6)  Miralax Powd (Polyethylene glycol 3350) .... As directed as needed 7)  Detrol La 2 Mg Xr24h-cap (Tolterodine tartrate) .Marland Kitchen.. 1 by mouth once daily 8)  Vitamin E 1000 Unit Caps (Vitamin e) .Marland Kitchen.. 1 by mouth once daily  Other Orders: Flu Vaccine 62yrs + MEDICARE PATIENTS (V7846) Administration Flu vaccine - MCR (N6295) Flu Vaccine Consent Questions     Do you have a history of severe allergic reactions to this vaccine? no    Any prior history of allergic reactions to egg and/or gelatin? no    Do you have a sensitivity to the preservative Thimersol? no    Do you have a past history of Guillan-Barre Syndrome? no    Do you currently have an acute febrile illness? no    Have you ever had a severe reaction to latex? no    Vaccine information given  and explained to patient? yes    Are you currently pregnant? no    Lot Number:[id number]   Exp Date:04/01/2011   Site Given  Right Deltoid IM Flu Vaccine 80yrs + MEDICARE PATIENTS (M8413) Administration Flu vaccine - MCR (K4401) Prescriptions: DETROL LA 2 MG XR24H-CAP (TOLTERODINE TARTRATE) 1 by mouth once daily  #30 x 2   Entered and Authorized by:   [name] DO   Signed by:   [name] DO on 06/27/2010   Method used:   [facility] to        [facility] [phone]* (retail)       [address]       [place], [place]  [address]       Ph: [phone]       Fax: [phone]   RxID:   [id number]   .lbmedflu   EKG  Procedure date:  06/27/2010  Findings:      sinus rhythm 71 bpm  PAC's noted.     Last Flu Vaccine:  Fluvax MCR (06/28/2009 10:18:03 AM) Flu Vaccine Result Date:  06/27/2010 Flu Vaccine Result:  given Flu Vaccine Next Due:  1 yr PAP Next Due:  Refused  Appended Document: YEARLY EXAM AND FASTING LABS///SPH  Laboratory Results   Urine Tests   Date/Time Reported: June 27, 2010 10:29 AM   Routine Urinalysis   Color: yellow Appearance: Clear Glucose: negative   (Normal Range: Negative) Bilirubin: negative   (Normal Range: Negative) Ketone: negative   (Normal Range: Negative) Spec. Gravity: <1.005   (Normal Range: 1.003-1.035) Blood: negative   (Normal Range: Negative) pH: 6.5   (Normal Range: 5.0-8.0) Protein: negative   (Normal Range: Negative) Urobilinogen: negative   (Normal Range: 0-1) Nitrite: negative   (Normal Range: Negative) Leukocyte Esterace: negative   (Normal Range: Negative)    Comments: Floydene Flock  June 27, 2010 10:30 AM

## 2010-11-01 NOTE — Letter (Signed)
Summary: New Patient letter  [facility]  [address] [place], [place] [address]   Phone: [phone]  Fax: [phone]       03/23/2010 MRN: [id number]  [facility] [address] [place], [place]  [address]  Dear Ms. [name],  Welcome to the Gastroenterology Division at [facility].    You are scheduled to see Dr.  Christella Hartigan on 05-03-10 at 10:00a.m. on the 3rd floor at [facility], [address]  We ask that you try to arrive at our office 15 minutes prior to your appointment time to allow for check-in.  We would like you to complete the enclosed self-administered evaluation form prior to your visit and bring it with you on the day of your appointment.  We will review it with you.  Also, please bring a complete list of all your medications or, if you prefer, bring the medication bottles and we will list them.  Please bring your insurance card so that we may make a copy of it.  If your insurance requires a referral to see a specialist, please bring your referral form from your primary care physician.  Co-payments are due at the time of your visit and may be paid by cash, check or credit card.     Your office visit will consist of a consult with your physician (includes a physical exam), any laboratory testing he/she may order, scheduling of any necessary diagnostic testing (e.g. x-ray, ultrasound, CT-scan), and scheduling of a procedure (e.g. Endoscopy, Colonoscopy) if required.  Please allow enough time on your schedule to allow for any/all of these possibilities.    If you cannot keep your appointment, please call [phone] to cancel or reschedule prior to your appointment date.  This allows [place] the opportunity to schedule an appointment for another patient in need of care.  If you do not cancel or reschedule by 5 p.m. the business day prior to your appointment date, you will be charged a $50.00 late cancellation/no-show fee.    Thank you for choosing  [facility] for your medical needs.  We appreciate the opportunity to care for you.  Please visit [place] at our website  to learn more about our practice.                     Sincerely,                                                             The Gastroenterology Division

## 2010-11-01 NOTE — Progress Notes (Signed)
Summary: refill  Phone Note Refill Request Message from:  Fax from Pharmacy on [address] fax [phone]  amlodipine besylate 5 mg  Initial call taken by: Barb Merino,  October 14, 2009 8:32 AM    Prescriptions: NORVASC 5 MG  TABS (AMLODIPINE BESYLATE) TAKE ONE TABLET BY MOUTH DAILY**OFFICE VISIT DUE NOW**  #30 x 2   Entered by:   Army Fossa CMA   Authorized by:   [name] DO   Signed by:   Army Fossa CMA on 10/14/2009   Method used:   [facility]ronically to        [facility] [phone]* (retail)       [address]       [place], [place]  [address]       Ph: [phone]       Fax: [phone]   RxID:   [id number]

## 2010-11-01 NOTE — Assessment & Plan Note (Signed)
Summary: prolia inj/cbs   Nurse Visit   Allergies: No Known Drug Allergies  Medication Administration  Injection # 1:    Medication: Prolia 60mg     Diagnosis: SENILE OSTEOPOROSIS (ICD-733.01)    Route: SQ    Site: L deltoid    Exp Date: 10/03/2011    Lot #: [id number]    Mfr: amgen    Patient tolerated injection without complications    Given by: Almeta Monas CMA Duncan Dull) (August 17, 2010 9:49 AM)  Orders Added: 1)  Prolia 60mg  [J3590] 2)  Admin of Therapeutic Inj  intramuscular or subcutaneous [14782]

## 2010-11-01 NOTE — Letter (Signed)
Summary: [facility]: Immunoassay Fecal Occult Blood (iFOB) Order Form  [facility] [place]  [address] [place], [place] [address]   Phone: [phone]  Fax: [phone]      [facility]: Immunoassay Fecal Occult Blood (iFOB) Order Form   March 11, 2010 MRN: [id number]   [name] 1928-01-23   Physicican Name:______Yvonne Lowne,DO___________________  Diagnosis Code:_______v76.51___________________      Army Fossa CMA

## 2010-11-01 NOTE — Progress Notes (Signed)
Summary: Prolia  Phone Note Outgoing Call   Summary of Call: Per Josh the Prolia Rep, she has zero out of pocket for the Prolia Injection. I informed pt and we will order for her and call when it comes in. Army Fossa CMA  Feb 02, 2010 1:04 PM

## 2010-11-01 NOTE — Progress Notes (Signed)
Summary: Prolia  Phone Note Outgoing Call Call back at [facility] Phone [phone]   Summary of Call: We reverified her Prolia and she is going to have to pay $458.40, she states she does not need it that bad. Any other suggestions? This would be her 2nd injection. Army Fossa CMA  Jan 31, 2010 12:06 PM   Follow-up for Phone Call        try reclast or forteo Follow-up by: [name] DO,  Jan 31, 2010 12:09 PM  Additional Follow-up for Phone Call Additional follow up Details #1::        Pt is aware- will send Reclast. Army Fossa CMA  Jan 31, 2010 12:45 PM

## 2010-11-01 NOTE — Progress Notes (Signed)
Summary: [facility]ita [place] stop BLADDER MED  Phone Note Call from Patient Call back at [facility] Phone [phone]   Caller: Patient Summary of Call: PT STATES THAT SHE DOES NOT WANT TO TAKE THE MEDICATION ANYMORE FOR HER OVERACTIVE BLADDER.SHE COMPLAINS OF CONSTIPATION. HAS NOT TAKEN MEDICATION IN A WEEK. PLEASE CONTACT HER AND LET HER KNOW  WHAT SHE SHOULD DO. Initial call taken by: Lavell Islam,  March 29, 2010 10:18 AM  Follow-up for Phone Call        PT states that she does not want to take any other meds. pt just wanted to let you know that she stop med due to constipation. Pt is doing fine without med with only a few incidents that have not been as bad as she would have thought........................Marland KitchenFelecia Deloach CMA  March 29, 2010 10:56 AM

## 2010-11-01 NOTE — Progress Notes (Signed)
Summary: Prolia approved  Phone Note Outgoing Call   Call placed by: Almeta Monas CMA [facility]),  July 06, 2010 4:05 PM Call placed to: Patient Details for Reason: Prolia information Summary of Call: spk with pt and advised that Prola is covered at 100% 0 out of pocket. Pt voiced understanding inj due in Nov. Initial call taken by: Almeta Monas CMA [facility]),  July 06, 2010 4:05 PM

## 2010-12-27 ENCOUNTER — Ambulatory Visit: Payer: Self-pay | Admitting: Family Medicine

## 2010-12-28 ENCOUNTER — Encounter: Payer: Self-pay | Admitting: Family Medicine

## 2010-12-29 ENCOUNTER — Ambulatory Visit (INDEPENDENT_AMBULATORY_CARE_PROVIDER_SITE_OTHER): Payer: PRIVATE HEALTH INSURANCE | Admitting: Family Medicine

## 2010-12-29 ENCOUNTER — Encounter: Payer: Self-pay | Admitting: Family Medicine

## 2010-12-29 DIAGNOSIS — I10 Essential (primary) hypertension: Secondary | ICD-10-CM

## 2010-12-29 DIAGNOSIS — M810 Age-related osteoporosis without current pathological fracture: Secondary | ICD-10-CM

## 2010-12-29 NOTE — Assessment & Plan Note (Signed)
On prolia Paperwork filled out

## 2010-12-29 NOTE — Patient Instructions (Signed)
Papers filled out today

## 2010-12-29 NOTE — Progress Notes (Signed)
  Subjective:    Patient ID: [name], female    DOB: 1928/08/15, 77 y.o.   MRN: [id number]  HPI Pt here to have paper work filled out for [facility] only.  She did not pass eye exam.  She has already been to eye doctor for full exam.  No other complaints.    Review of Systems As above    Objective:   Physical Exam  Constitutional: She appears well-developed and well-nourished. No distress.  Skin: She is not diaphoretic.  Psychiatric: She has a normal mood and affect. Judgment normal.          Assessment & Plan:

## 2010-12-29 NOTE — Assessment & Plan Note (Signed)
Controlled con't meds Paperwork filled out

## 2011-01-09 ENCOUNTER — Other Ambulatory Visit: Payer: Self-pay | Admitting: Family Medicine

## 2011-01-09 DIAGNOSIS — Z1231 Encounter for screening mammogram for malignant neoplasm of breast: Secondary | ICD-10-CM

## 2011-01-16 ENCOUNTER — Telehealth: Payer: Self-pay | Admitting: Family Medicine

## 2011-02-13 ENCOUNTER — Ambulatory Visit
Admission: RE | Admit: 2011-02-13 | Discharge: 2011-02-13 | Disposition: A | Discharge status: Home / Self Care | Payer: Medicare Other | Source: Ambulatory Visit | Attending: Family Medicine | Admitting: Family Medicine

## 2011-02-13 DIAGNOSIS — Z1231 Encounter for screening mammogram for malignant neoplasm of breast: Secondary | ICD-10-CM

## 2011-02-22 ENCOUNTER — Ambulatory Visit (INDEPENDENT_AMBULATORY_CARE_PROVIDER_SITE_OTHER): Payer: Medicare Other | Admitting: *Deleted

## 2011-02-22 ENCOUNTER — Ambulatory Visit: Payer: PRIVATE HEALTH INSURANCE | Admitting: Family Medicine

## 2011-02-22 DIAGNOSIS — M810 Age-related osteoporosis without current pathological fracture: Secondary | ICD-10-CM

## 2011-02-22 MED ORDER — DENOSUMAB 60 MG/ML ~~LOC~~ SOLN
60.0000 mg | Freq: Once | SUBCUTANEOUS | Status: AC
  Administered 2011-02-22: 60 mg via SUBCUTANEOUS

## 2011-03-23 NOTE — Telephone Encounter (Signed)
error 

## 2011-04-11 ENCOUNTER — Other Ambulatory Visit: Payer: Self-pay | Admitting: Family Medicine

## 2011-04-15 ENCOUNTER — Other Ambulatory Visit: Payer: Self-pay | Admitting: Family Medicine

## 2011-04-17 ENCOUNTER — Ambulatory Visit (INDEPENDENT_AMBULATORY_CARE_PROVIDER_SITE_OTHER): Payer: Medicare Other | Admitting: Family Medicine

## 2011-04-17 ENCOUNTER — Encounter: Payer: Self-pay | Admitting: Family Medicine

## 2011-04-17 VITALS — BP 132/82 | HR 81 | Temp 97.8°F | Wt 129.4 lb

## 2011-04-17 DIAGNOSIS — R32 Unspecified urinary incontinence: Secondary | ICD-10-CM

## 2011-04-17 DIAGNOSIS — R0602 Shortness of breath: Secondary | ICD-10-CM

## 2011-04-17 DIAGNOSIS — I10 Essential (primary) hypertension: Secondary | ICD-10-CM

## 2011-04-17 MED ORDER — OXYBUTYNIN CHLORIDE ER 5 MG PO TB24: 5.0000 mg | ORAL_TABLET | Freq: Every day | ORAL | Status: DC

## 2011-04-17 MED ORDER — AMLODIPINE BESYLATE 5 MG PO TABS: 5.0000 mg | ORAL_TABLET | Freq: Every day | ORAL | Status: DC

## 2011-04-17 NOTE — Progress Notes (Signed)
  Subjective:    Patient here for follow-up of elevated blood pressure.  She is not exercising and is adherent to a low-salt diet.  Blood pressure is not checked  at home. Cardiac symptoms: none. Patient denies: chest pain, chest pressure/discomfort, claudication, dyspnea, exertional chest pressure/discomfort, fatigue, irregular heart beat and palpitations. Cardiovascular risk factors: advanced age (older than 37 for men, 21 for women), dyslipidemia, hypertension and sedentary lifestyle. Use of agents associated with hypertension: none. History of target organ damage: none.  Pt denies sob but is obviously  The following portions of the patient's history were reviewed and updated as appropriate: allergies, current medications, past family history, past medical history, past social history, past surgical history and problem list.  Review of Systems Pertinent items are noted in HPI.     Objective:    BP 132/82  Pulse 81  Temp(Src) 97.8 F (36.6 C) (Oral)  Wt 129 lb 6.4 oz (58.695 kg)  SpO2 96% General appearance: alert, cooperative, appears stated age and no distress Lungs: clear to auscultation bilaterally Heart: S1, S2 normal Extremities: extremities normal, atraumatic, no cyanosis or edema    Assessment:    Hypertension, normal blood pressure . Evidence of target organ damage: none.   sob---check 2d echo Plan:    Medication: no change. Dietary sodium restriction. Regular aerobic exercise. Follow up: 6 months and as needed.

## 2011-04-21 ENCOUNTER — Ambulatory Visit (HOSPITAL_COMMUNITY): Payer: Medicare Other | Attending: Family Medicine | Admitting: Radiology

## 2011-04-21 DIAGNOSIS — I10 Essential (primary) hypertension: Secondary | ICD-10-CM | POA: Insufficient documentation

## 2011-04-21 DIAGNOSIS — R0989 Other specified symptoms and signs involving the circulatory and respiratory systems: Secondary | ICD-10-CM | POA: Insufficient documentation

## 2011-04-21 DIAGNOSIS — I379 Nonrheumatic pulmonary valve disorder, unspecified: Secondary | ICD-10-CM | POA: Insufficient documentation

## 2011-04-21 DIAGNOSIS — I059 Rheumatic mitral valve disease, unspecified: Secondary | ICD-10-CM | POA: Insufficient documentation

## 2011-04-21 DIAGNOSIS — R0609 Other forms of dyspnea: Secondary | ICD-10-CM | POA: Insufficient documentation

## 2011-05-11 ENCOUNTER — Ambulatory Visit (INDEPENDENT_AMBULATORY_CARE_PROVIDER_SITE_OTHER): Payer: Medicare Other | Admitting: Family Medicine

## 2011-05-11 DIAGNOSIS — R0609 Other forms of dyspnea: Secondary | ICD-10-CM

## 2011-05-11 DIAGNOSIS — R0602 Shortness of breath: Secondary | ICD-10-CM

## 2011-05-12 NOTE — Progress Notes (Signed)
  Subjective:    Patient ID: [name], female    DOB: 12/11/1927, 77 y.o.   MRN: [id number]  HPI Pt hee for pulm function test---secondary to sob---however pt states she doen't feel sob anymore.   Review of Systems    as above Objective:   Physical Exam        Assessment & Plan:

## 2011-05-12 NOTE — Assessment & Plan Note (Signed)
Improved Normal pfts

## 2011-06-11 ENCOUNTER — Other Ambulatory Visit: Payer: Self-pay | Admitting: Family Medicine

## 2011-06-28 ENCOUNTER — Encounter: Payer: Self-pay | Admitting: Family Medicine

## 2011-06-29 ENCOUNTER — Encounter: Payer: Self-pay | Admitting: Family Medicine

## 2011-06-29 ENCOUNTER — Ambulatory Visit (INDEPENDENT_AMBULATORY_CARE_PROVIDER_SITE_OTHER): Payer: Medicare Other | Admitting: Family Medicine

## 2011-06-29 VITALS — BP 132/76 | HR 64 | Temp 97.7°F | Ht 63.0 in | Wt 120.2 lb

## 2011-06-29 DIAGNOSIS — Z23 Encounter for immunization: Secondary | ICD-10-CM

## 2011-06-29 DIAGNOSIS — N390 Urinary tract infection, site not specified: Secondary | ICD-10-CM

## 2011-06-29 DIAGNOSIS — E785 Hyperlipidemia, unspecified: Secondary | ICD-10-CM

## 2011-06-29 DIAGNOSIS — Z0000 Encounter for general adult medical examination without abnormal findings: Secondary | ICD-10-CM

## 2011-06-29 DIAGNOSIS — Z0100 Encounter for examination of eyes and vision without abnormal findings: Secondary | ICD-10-CM

## 2011-06-29 DIAGNOSIS — I10 Essential (primary) hypertension: Secondary | ICD-10-CM

## 2011-06-29 LAB — POCT URINALYSIS DIPSTICK
Bilirubin, UA: NEGATIVE
Blood, UA: NEGATIVE
Glucose, UA: NEGATIVE
Nitrite, UA: NEGATIVE

## 2011-06-29 NOTE — Progress Notes (Signed)
Subjective:    [name] is a 77 y.o. female who presents for Medicare Annual/Subsequent preventive examination.  Preventive Screening-Counseling & Management  Tobacco History  Smoking status  . Never Smoker   Smokeless tobacco  . Never Used     Problems Prior to Visit 1.   Current Problems (verified) Patient Active Problem List  Diagnoses  . HYPERTENSION  . HEMOCCULT POSITIVE STOOL  . OVERACTIVE BLADDER  . DERMATITIS  . OSTEOARTHRITIS  . ARM PAIN, RIGHT  . OSTEOPOROSIS  . SENILE OSTEOPOROSIS  . EDEMA, ANKLES  . BLOOD IN STOOL, OCCULT  . DOE (dyspnea on exertion)    Medications Prior to Visit Current Outpatient Prescriptions on File Prior to Visit  Medication Sig Dispense Refill  . amLODipine (NORVASC) 5 MG tablet Take 1 tablet (5 mg total) by mouth daily.  30 tablet  5  . aspirin 81 MG tablet Take 81 mg by mouth daily.        . Calcium Carbonate (CALTRATE 600) 1500 MG TABS Take 1 tablet by mouth 2 (two) times daily.        Marland Kitchen denosumab (PROLIA) 60 MG/ML SOLN Inject 60 mg into the skin every 6 (six) months.        . fish oil-omega-3 fatty acids 1000 MG capsule Take 2 g by mouth daily.        Marland Kitchen oxybutynin (DITROPAN-XL) 5 MG 24 hr tablet TAKE ONE TABLET DAILY  30 tablet  5  . polyethylene glycol powder (MIRALAX) powder Take 17 g by mouth as directed.          Current Medications (verified) Current Outpatient Prescriptions  Medication Sig Dispense Refill  . amLODipine (NORVASC) 5 MG tablet Take 1 tablet (5 mg total) by mouth daily.  30 tablet  5  . aspirin 81 MG tablet Take 81 mg by mouth daily.        . Calcium Carbonate (CALTRATE 600) 1500 MG TABS Take 1 tablet by mouth 2 (two) times daily.        Marland Kitchen denosumab (PROLIA) 60 MG/ML SOLN Inject 60 mg into the skin every 6 (six) months.        . fish oil-omega-3 fatty acids 1000 MG capsule Take 2 g by mouth daily.        Marland Kitchen oxybutynin (DITROPAN-XL) 5 MG 24 hr tablet TAKE ONE TABLET DAILY  30 tablet  5  . polyethylene  glycol powder (MIRALAX) powder Take 17 g by mouth as directed.           Allergies (verified) Review of patient's allergies indicates no known allergies.   PAST HISTORY  Family History Family History  Problem Relation Age of Onset  . Depression    . Heart attack    . Coronary artery disease    . Coronary artery disease Mother 65  . Heart attack Son 43    Social History History  Substance Use Topics  . Smoking status: Never Smoker   . Smokeless tobacco: Never Used  . Alcohol Use: Yes     Are there smokers in your home (other than you)? No  Risk Factors Current exercise habits: no but she volunteers [facility]  Dietary issues discussed: na   Cardiac risk factors: advanced age (older than 68 for men, 6 for women), hypertension and sedentary lifestyle.  Depression Screen (Note: if answer to either of the following is "Yes", a more complete depression screening is indicated)   Over the past two weeks,  have you felt down, depressed or hopeless? No  Over the past two weeks, have you felt little interest or pleasure in doing things? No  Have you lost interest or pleasure in daily life? No  Do you often feel hopeless? No  Do you cry easily over simple problems? No  Activities of Daily Living In your present state of health, do you have any difficulty performing the following activities?:  Driving? No Managing money?  No Feeding yourself? No Getting from bed to chair? No Climbing a flight of stairs? No Preparing food and eating?: No Bathing or showering? No Getting dressed: No Getting to the toilet? No Using the toilet:No Moving around from place to place: No In the past year have you fallen or had a near fall?:No   Are you sexually active?  No  Do you have more than one partner?  No  Hearing Difficulties: Yes Do you often ask people to speak up or repeat themselves? No Do you experience ringing or noises in your ears? No Do you have difficulty  understanding soft or whispered voices? No Pt has hearing aids b/l ---no problems with them in.  Do you feel that you have a problem with memory? No  Do you often misplace items? No  Do you feel safe at home?  Yes  Cognitive Testing  Alert? Yes  Normal Appearance?Yes  Oriented to person? Yes  Place? Yes   Time? Yes  Recall of three objects?  Yes  Can perform simple calculations? Yes  Displays appropriate judgment?Yes  Can read the correct time from a watch face?Yes   Advanced Directives have been discussed with the patient? Yes  List the Names of Other Physician/Practitioners you currently use: 1.    Indicate any recent Medical Services you may have received from other than [facility] providers in the past year (date may be approximate).  Immunization History  Administered Date(s) Administered  . Influenza Split 06/29/2011  . Influenza Whole 08/02/2005, 08/06/2007, 07/15/2008, 06/28/2009, 06/27/2010  . Pneumococcal Polysaccharide 11/08/2006  . Td 10/02/2005  . Zoster 12/12/2006    Screening Tests Health Maintenance  Topic Date Due  . Influenza Vaccine  07/03/2011  . Tetanus/tdap  10/03/2015  . Colonoscopy  05/09/2020  . Pneumococcal Polysaccharide Vaccine Age 62 And Over  Completed  . Zostavax  Completed    All answers were reviewed with the patient and necessary referrals were made:  [name], DO   06/29/2011   History reviewed: allergies, current medications, past family history, past medical history, past social history, past surgical history and problem list  Review of Systems  Review of Systems  Constitutional: Negative for activity change, appetite change and fatigue.  HENT: Negative for hearing loss, congestion, tinnitus and ear discharge.  dentist--due-- low partial Eyes: Negative for visual disturbance (see optho q few years -- ) Respiratory: Negative for cough, chest tightness and shortness of breath.   Cardiovascular: Negative for chest pain, palpitations and  leg swelling.  Gastrointestinal: Negative for abdominal pain, diarrhea, constipation and abdominal distention.  Genitourinary: Negative for urgency, frequency, decreased urine volume and difficulty urinating.  Musculoskeletal: Negative for back pain, arthralgias and gait problem.  Skin: Negative for color change, pallor and rash.  Neurological: Negative for dizziness, light-headedness, numbness and headaches.  Hematological: Negative for adenopathy. Does not bruise/bleed easily.  Psychiatric/Behavioral: Negative for suicidal ideas, confusion, sleep disturbance, self-injury, dysphoric mood, decreased concentration and agitation.  Pt is able to read and write and can do all ADLs No risk  for falling No abuse/ violence in home    Objective:     Vision by Snellen chart: right ZOX:WRUEA, left VWU:[id number]  Body mass index is 21.29 kg/(m^2). BP 132/76  Pulse 64  Temp(Src) 97.7 F (36.5 C) (Oral)  Ht 5\' 3"  (1.6 m)  Wt 120 lb 3.2 oz (54.522 kg)  BMI 21.29 kg/m2  SpO2 97%  BP 132/76  Pulse 64  Temp(Src) 97.7 F (36.5 C) (Oral)  Ht 5\' 3"  (1.6 m)  Wt 120 lb 3.2 oz (54.522 kg)  BMI 21.29 kg/m2  SpO2 97% General appearance: alert, cooperative, appears stated age and no distress Head: Normocephalic, without obvious abnormality, atraumatic Eyes: conjunctivae/corneas clear. PERRL, EOM's intact. Fundi benign. Ears: normal TM's and external ear canals both ears Nose: Nares normal. Septum midline. Mucosa normal. No drainage or sinus tenderness. Throat: lips, mucosa, and tongue normal; teeth and gums normal Neck: no adenopathy, no carotid bruit, no JVD, supple, symmetrical, trachea midline and thyroid not enlarged, symmetric, no tenderness/mass/nodules Back: negative Lungs: clear to auscultation bilaterally Breasts: normal appearance, no masses or tenderness Heart: regular rate and rhythm, S1, S2 normal, no murmur, click, rub or gallop Abdomen: soft, non-tender; bowel sounds normal; no masses,   no organomegaly Extremities: extremities normal, atraumatic, no cyanosis or edema Pulses: 2+ and symmetric Skin: Skin color, texture, turgor normal. No rashes or lesions Lymph nodes: Cervical, supraclavicular, and axillary nodes normal. Neurologic: Grossly normal     Assessment:    cpe  HTN osteoporosis   Plan:     During the course of the visit the patient was educated and counseled about appropriate screening and preventive services including:    Pneumococcal vaccine   Influenza vaccine  Td vaccine  Screening electrocardiogram  Screening mammography  Bone densitometry screening  Colorectal cancer screening  Glaucoma screening  Nutrition counseling   Advanced directives: has an advanced directive - a copy HAS NOT been provided.  Diet review for nutrition referral? Yes ____  Not Indicated __x__   Patient Instructions (the written plan) was given to the patient.  Medicare Attestation I have personally reviewed: The patient's medical and social history Their use of alcohol, tobacco or illicit drugs Their current medications and supplements The patient's functional ability including ADLs,fall risks, home safety risks, cognitive, and hearing and visual impairment Diet and physical activities Evidence for depression or mood disorders  The patient's weight, height, BMI, and visual acuity have been recorded in the chart.  I have made referrals, counseling, and provided education to the patient based on review of the above and I have provided the patient with a written personalized care plan for preventive services.     [name], DO   06/29/2011

## 2011-06-30 LAB — BASIC METABOLIC PANEL
GFR: 89.65 mL/min (ref >60.00)
Potassium: 4.3 mEq/L (ref 3.5–5.1)
Sodium: 139 mEq/L (ref 135–145)

## 2011-06-30 LAB — CBC WITH DIFFERENTIAL/PLATELET
Eosinophils Relative: 4.7 % (ref 0.0–5.0)
HCT: 41.9 % (ref 36.0–46.0)
Hemoglobin: 13.7 g/dL (ref 12.0–15.0)
Lymphs Abs: 2 10*3/uL (ref 0.7–4.0)
Monocytes Relative: 5.7 % (ref 3.0–12.0)
Neutro Abs: 5.7 10*3/uL (ref 1.4–7.7)
RBC: 4.58 Mil/uL (ref 3.87–5.11)
WBC: 8.7 10*3/uL (ref 4.5–10.5)

## 2011-06-30 LAB — HEPATIC FUNCTION PANEL
ALT: 13 U/L (ref 0–35)
AST: 23 U/L (ref 0–37)
Albumin: 3.9 g/dL (ref 3.5–5.2)
Total Bilirubin: 0.7 mg/dL (ref 0.3–1.2)

## 2011-06-30 LAB — LIPID PANEL
Cholesterol: 205 mg/dL — ABNORMAL HIGH (ref 0–200)
Total CHOL/HDL Ratio: 2

## 2011-07-01 LAB — URINE CULTURE

## 2011-08-29 ENCOUNTER — Ambulatory Visit (INDEPENDENT_AMBULATORY_CARE_PROVIDER_SITE_OTHER): Payer: Medicare Other

## 2011-08-29 DIAGNOSIS — M810 Age-related osteoporosis without current pathological fracture: Secondary | ICD-10-CM

## 2011-08-29 MED ORDER — DENOSUMAB 60 MG/ML ~~LOC~~ SOLN
60.0000 mg | Freq: Once | SUBCUTANEOUS | Status: AC
  Administered 2011-08-29: 60 mg via SUBCUTANEOUS

## 2011-12-08 ENCOUNTER — Telehealth: Payer: Self-pay | Admitting: Family Medicine

## 2011-12-08 ENCOUNTER — Other Ambulatory Visit: Payer: Self-pay | Admitting: Family Medicine

## 2011-12-08 DIAGNOSIS — I10 Essential (primary) hypertension: Secondary | ICD-10-CM

## 2011-12-08 MED ORDER — OXYBUTYNIN CHLORIDE ER 5 MG PO TB24: ORAL_TABLET | ORAL | Status: DC

## 2011-12-08 MED ORDER — AMLODIPINE BESYLATE 5 MG PO TABS: 5.0000 mg | ORAL_TABLET | Freq: Every day | ORAL | Status: DC

## 2011-12-08 NOTE — Telephone Encounter (Signed)
Patient called & says her pharmacy told her she had to come back in to see the Dr., before we would refill her BP meds & overactive bladder meds. I do not see this in the chart & the patient says she doesn't understand why she needs to come in cause she fills fine [name] ph# [phone], please call when you can  Thanks

## 2011-12-08 NOTE — Telephone Encounter (Signed)
Pt called back Rx sent. 

## 2012-01-05 ENCOUNTER — Other Ambulatory Visit: Payer: Self-pay | Admitting: Family Medicine

## 2012-01-08 ENCOUNTER — Other Ambulatory Visit: Payer: Self-pay | Admitting: Family Medicine

## 2012-01-31 ENCOUNTER — Encounter: Payer: Self-pay | Admitting: Family Medicine

## 2012-01-31 ENCOUNTER — Ambulatory Visit (INDEPENDENT_AMBULATORY_CARE_PROVIDER_SITE_OTHER): Payer: Medicare Other | Admitting: Family Medicine

## 2012-01-31 VITALS — BP 130/70 | HR 81 | Temp 97.6°F | Wt 125.8 lb

## 2012-01-31 DIAGNOSIS — L089 Local infection of the skin and subcutaneous tissue, unspecified: Secondary | ICD-10-CM

## 2012-01-31 DIAGNOSIS — M810 Age-related osteoporosis without current pathological fracture: Secondary | ICD-10-CM

## 2012-01-31 DIAGNOSIS — I10 Essential (primary) hypertension: Secondary | ICD-10-CM

## 2012-01-31 DIAGNOSIS — R609 Edema, unspecified: Secondary | ICD-10-CM

## 2012-01-31 MED ORDER — AMLODIPINE BESYLATE 5 MG PO TABS: 5.0000 mg | ORAL_TABLET | Freq: Every day | ORAL | Status: DC

## 2012-01-31 MED ORDER — HYDROCHLOROTHIAZIDE 25 MG PO TABS: 25.0000 mg | ORAL_TABLET | Freq: Every day | ORAL | Status: DC

## 2012-01-31 MED ORDER — BACITRACIN-NEOMYCIN-POLYMYXIN 400-5-5000 EX OINT: TOPICAL_OINTMENT | CUTANEOUS | Status: DC

## 2012-01-31 NOTE — Patient Instructions (Signed)

## 2012-01-31 NOTE — Progress Notes (Signed)
  Subjective:    Patient here for follow-up of elevated blood pressure.  She is not exercising and is adherent to a low-salt diet.  Blood pressure is well controlled at home. Cardiac symptoms: none. Patient denies: chest pain, chest pressure/discomfort, claudication, exertional chest pressure/discomfort, fatigue, irregular heart beat, near-syncope, orthopnea, palpitations, paroxysmal nocturnal dyspnea, syncope and tachypnea. Cardiovascular risk factors: advanced age (older than 45 for men, 54 for women), hypertension and sedentary lifestyle. Use of agents associated with hypertension: none. History of target organ damage: none.  The following portions of the patient's history were reviewed and updated as appropriate: allergies, current medications, past family history, past medical history, past social history, past surgical history and problem list.  Review of Systems Pertinent items are noted in HPI.     Objective:    BP 130/70  Pulse 81  Temp(Src) 97.6 F (36.4 C) (Oral)  Wt 125 lb 12.8 oz (57.063 kg)  SpO2 98% General appearance: alert, cooperative, appears stated age and no distress Lungs: clear to auscultation bilaterally Heart: S1, S2 normal Extremities: edema + trace pitting edema l > R    Assessment:    Hypertension, normal blood pressure . Evidence of target organ damage: none.   osteoporosis---check bmd--   con't prolia Plan:    Medication: no change. Dietary sodium restriction. Regular aerobic exercise. Check blood pressures 2-3 times weekly and record. Follow up: 3 months and as needed.

## 2012-02-12 ENCOUNTER — Other Ambulatory Visit: Payer: Medicare Other

## 2012-02-13 ENCOUNTER — Other Ambulatory Visit: Payer: Medicare Other

## 2012-02-28 ENCOUNTER — Ambulatory Visit (INDEPENDENT_AMBULATORY_CARE_PROVIDER_SITE_OTHER): Payer: Medicare Other | Admitting: Family Medicine

## 2012-02-28 DIAGNOSIS — M810 Age-related osteoporosis without current pathological fracture: Secondary | ICD-10-CM

## 2012-02-28 MED ORDER — DENOSUMAB 60 MG/ML ~~LOC~~ SOLN
60.0000 mg | Freq: Once | SUBCUTANEOUS | Status: AC
  Administered 2012-02-28: 60 mg via SUBCUTANEOUS

## 2012-03-01 ENCOUNTER — Other Ambulatory Visit: Payer: Medicare Other

## 2012-05-28 ENCOUNTER — Ambulatory Visit
Admission: RE | Admit: 2012-05-28 | Discharge: 2012-05-28 | Disposition: A | Discharge status: Home / Self Care | Payer: Medicare Other | Source: Ambulatory Visit | Attending: Family Medicine | Admitting: Family Medicine

## 2012-06-21 ENCOUNTER — Encounter: Payer: Self-pay | Admitting: Family Medicine

## 2012-06-26 ENCOUNTER — Ambulatory Visit (INDEPENDENT_AMBULATORY_CARE_PROVIDER_SITE_OTHER): Payer: Medicare Other | Admitting: Family Medicine

## 2012-06-26 ENCOUNTER — Encounter: Payer: Self-pay | Admitting: Family Medicine

## 2012-06-26 VITALS — BP 120/70 | HR 79 | Temp 97.9°F | Wt 116.0 lb

## 2012-06-26 DIAGNOSIS — R609 Edema, unspecified: Secondary | ICD-10-CM

## 2012-06-26 DIAGNOSIS — R262 Difficulty in walking, not elsewhere classified: Secondary | ICD-10-CM

## 2012-06-26 DIAGNOSIS — L84 Corns and callosities: Secondary | ICD-10-CM

## 2012-06-26 NOTE — Patient Instructions (Addendum)
Corns and Calluses °Corns are small areas of thickened skin that usually occur on the top, sides, or tip of a toe. They contain a cone-shaped core with a point that can press on a nerve below. This causes pain. Calluses are areas of thickened skin that usually develop on hands, fingers, palms, soles of the feet, and heels. These are areas that experience frequent friction or pressure. °CAUSES  °Corns are usually the result of rubbing (friction) or pressure from shoes that are too tight or do not fit properly. Calluses are caused by repeated friction and pressure on the affected areas. °SYMPTOMS °· A hard growth on the skin.  °· Pain or tenderness under the skin.  °· Sometimes, redness and swelling.  °· Increased discomfort while wearing tight-fitting shoes.  °DIAGNOSIS  °Your caregiver can usually tell what the problem is by doing a physical exam. °TREATMENT  °Removing the cause of the friction or pressure is usually the only treatment needed. However, sometimes medicines can be used to help soften the hardened, thickened areas. These medicines include salicylic acid plasters and 12% ammonium lactate lotion. These medicines should only be used under the direction of your caregiver. °HOME CARE INSTRUCTIONS  °· Try to remove pressure from the affected area.  °· You may wear donut-shaped corn pads to protect your skin.  °· You may use a pumice stone or nonmetallic nail file to gently reduce the thickness of a corn.  °· Wear properly fitted footwear.  °· If you have calluses on the hands, wear gloves during activities that cause friction.  °· If you have diabetes, you should regularly examine your feet. Tell your caregiver if you notice any problems with your feet.  °SEEK IMMEDIATE MEDICAL CARE IF:  °· You have increased pain, swelling, redness, or warmth in the affected area.  °· Your corn or callus starts to drain fluid or bleeds.  °· You are not getting better, even with treatment.  °Document Released: 06/24/2004  Document Revised: 09/07/2011 Document Reviewed: 05/16/2011 °[employer]® Patient Information ©2012 [employer]. °

## 2012-06-27 DIAGNOSIS — R262 Difficulty in walking, not elsewhere classified: Secondary | ICD-10-CM | POA: Insufficient documentation

## 2012-06-27 DIAGNOSIS — L84 Corns and callosities: Secondary | ICD-10-CM | POA: Insufficient documentation

## 2012-06-27 NOTE — Assessment & Plan Note (Signed)
Support socks Elevate legs

## 2012-06-27 NOTE — Progress Notes (Signed)
  Subjective:    Patient ID: [name], female    DOB: 11-Jan-1928, 78 y.o.   MRN: [id number]  HPI Pt here with care taker.   Her walking seems to have gotten worse but she does have pain and hard spots on end of her big toes.  L >R.  Pt is wearing white keds --- and has a lot of pain with walking. She also has some swelling in her ankles.  No inc sob.  No cp.   Review of Systems    as above Objective:   Physical Exam  Nursing note and vitals reviewed. Constitutional: She is oriented to person, place, and time. She appears well-developed and well-nourished.  Neck: Normal range of motion. Neck supple.  Cardiovascular: Normal rate.   Pulmonary/Chest: Breath sounds normal. No respiratory distress. She has no wheezes. She has no rales.  Musculoskeletal: Normal range of motion. She exhibits edema. She exhibits no tenderness.  Neurological: She is alert and oriented to person, place, and time.  Skin: She is not diaphoretic.       L big toe--- + black hard lesion  End of toe--? Callous Smaller lesion on R toe  Psychiatric: She has a normal mood and affect. Her behavior is normal.          Assessment & Plan:

## 2012-06-27 NOTE — Assessment & Plan Note (Signed)
Refer PT

## 2012-06-27 NOTE — Assessment & Plan Note (Signed)
Refer to podiatry

## 2012-06-28 ENCOUNTER — Telehealth: Payer: Self-pay | Admitting: Family Medicine

## 2012-06-28 ENCOUNTER — Telehealth: Payer: Self-pay

## 2012-06-28 NOTE — Telephone Encounter (Signed)
Spoke with [facility] and advised them it was okay to do Nurse visit per Dr. Laury Axon.        MW

## 2012-06-28 NOTE — Telephone Encounter (Signed)
Pt son called and stated mother cannot remember what she is to do. He would ike someone to call him back he has questions for you.

## 2012-06-28 NOTE — Telephone Encounter (Signed)
yes

## 2012-06-28 NOTE — Telephone Encounter (Signed)
You sent referral over for physical therapy and occupational therapy. [facility] wants to know can they add Nurse visit. Best contact # [phone]. Plz Advise me what To advise them. Thx     MW

## 2012-06-28 NOTE — Telephone Encounter (Signed)
Yes they are necessary her walking is so much worse and she has a bad callous on her foot that causes pain  Discussed with [name] and made him aware and he voiced understanding, he would like to contacted first before the patient because she can not remember anything.      [name] ----- Message -----  From: [name], CMA Sent: 06/27/2012 5:12 PM To: Deborah Perla, DO Subject: FW: referrals   Please advise [name]   ----- Message -----  From: Deborah Amabile, MA Sent: 06/27/2012 3:57 PM To: [name], CMA   Subject: referrals     Pt son, [name] called with several questions about referrals that were made for his mom. He wants to confirm they are necessary. referals- physical therapy, foot doc. Plz advise pt MW

## 2012-08-01 ENCOUNTER — Encounter: Payer: Self-pay | Admitting: Family Medicine

## 2012-08-01 ENCOUNTER — Encounter: Payer: Medicare Other | Admitting: Family Medicine

## 2012-08-01 ENCOUNTER — Ambulatory Visit (INDEPENDENT_AMBULATORY_CARE_PROVIDER_SITE_OTHER): Payer: Medicare Other | Admitting: Family Medicine

## 2012-08-01 VITALS — BP 108/70 | HR 80 | Temp 97.7°F | Wt 119.4 lb

## 2012-08-01 DIAGNOSIS — Z0000 Encounter for general adult medical examination without abnormal findings: Secondary | ICD-10-CM

## 2012-08-01 DIAGNOSIS — R413 Other amnesia: Secondary | ICD-10-CM

## 2012-08-01 DIAGNOSIS — I10 Essential (primary) hypertension: Secondary | ICD-10-CM

## 2012-08-01 DIAGNOSIS — Z23 Encounter for immunization: Secondary | ICD-10-CM

## 2012-08-01 NOTE — Assessment & Plan Note (Signed)
Pt / family does not want to do anything now-- they have hired someone to come in and be with her a few days and Olegario Messier , her daughter comes as well.

## 2012-08-01 NOTE — Progress Notes (Signed)
Subjective:    [name] is a 78 y.o. female who presents for Medicare Annual/Subsequent preventive examination.  Preventive Screening-Counseling & Management  Tobacco History  Smoking status  . Never Smoker   Smokeless tobacco  . Never Used     Problems Prior to Visit 1.   Current Problems (verified) Patient Active Problem List  Diagnosis  . HYPERTENSION  . HEMOCCULT POSITIVE STOOL  . OVERACTIVE BLADDER  . DERMATITIS  . OSTEOARTHRITIS  . ARM PAIN, RIGHT  . OSTEOPOROSIS  . SENILE OSTEOPOROSIS  . EDEMA, ANKLES  . BLOOD IN STOOL, OCCULT  . DOE (dyspnea on exertion)  . Pre-ulcerative corn or callous  . Difficulty walking    Medications Prior to Visit Current Outpatient Prescriptions on File Prior to Visit  Medication Sig Dispense Refill  . amLODipine (NORVASC) 5 MG tablet Take 1 tablet (5 mg total) by mouth daily.  30 tablet  5  . aspirin 81 MG tablet Take 81 mg by mouth daily.        . Calcium Carbonate (CALTRATE 600) 1500 MG TABS Take 1 tablet by mouth 2 (two) times daily.        Marland Kitchen denosumab (PROLIA) 60 MG/ML SOLN Inject 60 mg into the skin every 6 (six) months.        . fish oil-omega-3 fatty acids 1000 MG capsule Take 2 g by mouth daily.        . hydrochlorothiazide (HYDRODIURIL) 25 MG tablet Take 1 tablet (25 mg total) by mouth daily.  30 tablet  11  . neomycin-bacitracin-polymyxin (NEOSPORIN) ointment Apply to affected area bid prn  15 g  2  . oxybutynin (DITROPAN-XL) 5 MG 24 hr tablet TAKE 1 TABLET BY MOUTH EVERY DAY  30 tablet  0  . polyethylene glycol powder (MIRALAX) powder Take 17 g by mouth as directed.          Current Medications (verified) Current Outpatient Prescriptions  Medication Sig Dispense Refill  . amLODipine (NORVASC) 5 MG tablet Take 1 tablet (5 mg total) by mouth daily.  30 tablet  5  . aspirin 81 MG tablet Take 81 mg by mouth daily.        . Calcium Carbonate (CALTRATE 600) 1500 MG TABS Take 1 tablet by mouth 2 (two) times daily.         Marland Kitchen denosumab (PROLIA) 60 MG/ML SOLN Inject 60 mg into the skin every 6 (six) months.        . fish oil-omega-3 fatty acids 1000 MG capsule Take 2 g by mouth daily.        . hydrochlorothiazide (HYDRODIURIL) 25 MG tablet Take 1 tablet (25 mg total) by mouth daily.  30 tablet  11  . neomycin-bacitracin-polymyxin (NEOSPORIN) ointment Apply to affected area bid prn  15 g  2  . oxybutynin (DITROPAN-XL) 5 MG 24 hr tablet TAKE 1 TABLET BY MOUTH EVERY DAY  30 tablet  0  . polyethylene glycol powder (MIRALAX) powder Take 17 g by mouth as directed.           Allergies (verified) Review of patient's allergies indicates no known allergies.   PAST HISTORY  Family History Family History  Problem Relation Age of Onset  . Depression    . Heart attack    . Coronary artery disease    . Coronary artery disease Mother 63  . Heart attack Son 54    Social History History  Substance Use Topics  . Smoking status: Never Smoker   .  Smokeless tobacco: Never Used  . Alcohol Use: Yes     Are there smokers in your home (other than you)? No  Risk Factors Current exercise habits: The patient does not participate in regular exercise at present.  Dietary issues discussed: na   Cardiac risk factors: advanced age (older than 36 for men, 25 for women), hypertension and sedentary lifestyle.  Depression Screen (Note: if answer to either of the following is "Yes", a more complete depression screening is indicated)   Over the past two weeks, have you felt down, depressed or hopeless? No  Over the past two weeks, have you felt little interest or pleasure in doing things? No  Have you lost interest or pleasure in daily life? No  Do you often feel hopeless? No  Do you cry easily over simple problems? No  Activities of Daily Living In your present state of health, do you have any difficulty performing the following activities?:  Driving? Yes Managing money?  No Feeding yourself? No Getting from bed to chair?  No Climbing a flight of stairs? No stairs in house Preparing food and eating?: No Bathing or showering? No Getting dressed: No Getting to the toilet? Yes---sometimes get there quick enough Using the toilet:No Moving around from place to place: Yes In the past year have you fallen or had a near fall?:No   Are you sexually active?  No  Do you have more than one partner?  No  Hearing Difficulties: Yes Do you often ask people to speak up or repeat themselves? Yes Do you experience ringing or noises in your ears? No Do you have difficulty understanding soft or whispered voices? Yes   Do you feel that you have a problem with memory? Yes  Do you often misplace items? yes  Do you feel safe at home?  No  Cognitive Testing  Alert? Yes  Normal Appearance?Yes  Oriented to person? Yes  Place? No   Time? No  Recall of three objects?  No  Can perform simple calculations? No  Displays appropriate judgment?No  Can read the correct time from a watch face?Yes   Advanced Directives have been discussed with the patient? Yes  List the Names of Other Physician/Practitioners you currently use: 1.  Pod--Aljouny 2.opth--heckler  Indicate any recent Medical Services you may have received from other than [facility] providers in the past year (date may be approximate).  Immunization History  Administered Date(s) Administered  . Influenza Split 06/29/2011, 08/01/2012  . Influenza Whole 08/02/2005, 08/06/2007, 07/15/2008, 06/28/2009, 06/27/2010  . Pneumococcal Polysaccharide 11/08/2006  . Td 10/02/2005  . Zoster 12/12/2006    Screening Tests Health Maintenance  Topic Date Due  . Influenza Vaccine  06/02/2013  . Tetanus/tdap  10/03/2015  . Colonoscopy  05/09/2020  . Pneumococcal Polysaccharide Vaccine Age 57 And Over  Completed  . Zostavax  Completed    All answers were reviewed with the patient and necessary referrals were made:  [name], DO   08/01/2012   History reviewed:  She  has a past  medical history of Hypertension and Osteoporosis. She  does not have any pertinent problems on file. She  has past surgical history that includes Abdominal hysterectomy and Spine surgery (1980). Her family history includes Coronary artery disease in an unspecified family member; Coronary artery disease (age of onset:69) in her mother; Depression in an unspecified family member; Heart attack in an unspecified family member; and Heart attack (age of onset:51) in her son. She  reports that she has  never smoked. She has never used smokeless tobacco. She reports that she drinks alcohol. She reports that she does not use illicit drugs. She has a current medication list which includes the following prescription(s): amlodipine, aspirin, calcium carbonate, denosumab, fish oil-omega-3 fatty acids, hydrochlorothiazide, neomycin-bacitracin-polymyxin, oxybutynin, and polyethylene glycol powder. Current Outpatient Prescriptions on File Prior to Visit  Medication Sig Dispense Refill  . amLODipine (NORVASC) 5 MG tablet Take 1 tablet (5 mg total) by mouth daily.  30 tablet  5  . aspirin 81 MG tablet Take 81 mg by mouth daily.        . Calcium Carbonate (CALTRATE 600) 1500 MG TABS Take 1 tablet by mouth 2 (two) times daily.        Marland Kitchen denosumab (PROLIA) 60 MG/ML SOLN Inject 60 mg into the skin every 6 (six) months.        . fish oil-omega-3 fatty acids 1000 MG capsule Take 2 g by mouth daily.        . hydrochlorothiazide (HYDRODIURIL) 25 MG tablet Take 1 tablet (25 mg total) by mouth daily.  30 tablet  11  . neomycin-bacitracin-polymyxin (NEOSPORIN) ointment Apply to affected area bid prn  15 g  2  . oxybutynin (DITROPAN-XL) 5 MG 24 hr tablet TAKE 1 TABLET BY MOUTH EVERY DAY  30 tablet  0  . polyethylene glycol powder (MIRALAX) powder Take 17 g by mouth as directed.         She  has no known allergies.  Review of Systems  Review of Systems  Constitutional: Negative for activity change, appetite change and  fatigue.  HENT: Negative , congestion, tinnitus and ear discharge.  --+ hearing loss Eyes: Negative for visual disturbance (see optho q24m -) Respiratory: Negative for cough, chest tightness and shortness of breath.   Cardiovascular: Negative for chest pain, palpitations and leg swelling.  Gastrointestinal: Negative for abdominal pain, diarrhea, constipation and abdominal distention.  Genitourinary: Negative for urgency, frequency, decreased urine volume and difficulty urinating.  Musculoskeletal: +gait problems.  Skin: Negative for color change, pallor and rash.  Neurological: Negative for dizziness, light-headedness, numbness and headaches.  Hematological: Negative for adenopathy. Does not bruise/bleed easily.  Psychiatric/Behavioral:+ memory loss Pt is able to read and write and can do all ADLs No risk for falling No abuse/ violence in home     Objective:     Vision by Snellen chart:  opth ,There is no height on file to calculate BMI. BP 108/70  Pulse 80  Temp 97.7 F (36.5 C) (Oral)  Wt 119 lb 6.4 oz (54.159 kg)  SpO2 97%  BP 108/70  Pulse 80  Temp 97.7 F (36.5 C) (Oral)  Wt 119 lb 6.4 oz (54.159 kg)  SpO2 97% General appearance: alert, cooperative, appears stated age and no distress Head: Normocephalic, without obvious abnormality, atraumatic Eyes: negative findings: lids and lashes normal, conjunctivae and sclerae normal and pupils equal, round, reactive to light and accomodation Ears: normal TM's and external ear canals both ears Nose: Nares normal. Septum midline. Mucosa normal. No drainage or sinus tenderness. Throat: lips, mucosa, and tongue normal; teeth and gums normal Neck: no adenopathy, no carotid bruit, no JVD, supple, symmetrical, trachea midline and thyroid not enlarged, symmetric, no tenderness/mass/nodules Back: symmetric, no curvature. ROM normal. No CVA tenderness. Lungs: clear to auscultation bilaterally Breasts: normal appearance, no masses or  tenderness Heart: S1, S2 normal Abdomen: soft, non-tender; bowel sounds normal; no masses,  no organomegaly Pelvic: not indicated; post-menopausal, no abnormal Pap smears in  past Extremities: extremities normal, atraumatic, no cyanosis or edema Pulses: 2+ and symmetric Skin: Skin color, texture, turgor normal. No rashes or lesions Lymph nodes: Cervical, supraclavicular, and axillary nodes normal. Neurologic: Mental status: alertness: alert, orientation: person, thought content exhibits logical connections Cranial nerves: normal Sensory: normal Motor: weakness in legs Reflexes: normal Coordination: dec Gait: valgus deformity, walks with cane Psych--no depression     Assessment:     cpe    Plan:     During the course of the visit the patient was educated and counseled about appropriate screening and preventive services including:    Pneumococcal vaccine   Influenza vaccine  Td vaccine  Screening mammography  Bone densitometry screening  Glaucoma screening  Advanced directives: has an advanced directive - a copy HAS NOT been provided.  Diet review for nutrition referral? Yes ____  Not Indicated _x___family with supplement meals with meal replacement shake   Patient Instructions (the written plan) was given to the patient.  Medicare Attestation I have personally reviewed: The patient's medical and social history Their use of alcohol, tobacco or illicit drugs Their current medications and supplements The patient's functional ability including ADLs,fall risks, home safety risks, cognitive, and hearing and visual impairment Diet and physical activities Evidence for depression or mood disorders  The patient's weight, height, BMI, and visual acuity have been recorded in the chart.  I have made referrals, counseling, and provided education to the patient based on review of the above and I have provided the patient with a written personalized care plan for preventive  services.     [name], DO   08/01/2012

## 2012-08-01 NOTE — Assessment & Plan Note (Signed)
Stable con't meds 

## 2012-08-01 NOTE — Patient Instructions (Addendum)
Preventive Care for Adults, Female A healthy lifestyle and preventive care can promote health and wellness. Preventive health guidelines for women include the following key practices.  A routine yearly physical is a good way to check with your caregiver about your health and preventive screening. It is a chance to share any concerns and updates on your health, and to receive a thorough exam.  Visit your dentist for a routine exam and preventive care every 6 months. Brush your teeth twice a day and floss once a day. Good oral hygiene prevents tooth decay and gum disease.  The frequency of eye exams is based on your age, health, family medical history, use of contact lenses, and other factors. Follow your caregiver's recommendations for frequency of eye exams.  Eat a healthy diet. Foods like vegetables, fruits, whole grains, low-fat dairy products, and lean protein foods contain the nutrients you need without too many calories. Decrease your intake of foods high in solid fats, added sugars, and salt. Eat the right amount of calories for you.Get information about a proper diet from your caregiver, if necessary.  Regular physical exercise is one of the most important things you can do for your health. Most adults should get at least 150 minutes of moderate-intensity exercise (any activity that increases your heart rate and causes you to sweat) each week. In addition, most adults need muscle-strengthening exercises on 2 or more days a week.  Maintain a healthy weight. The body mass index (BMI) is a screening tool to identify possible weight problems. It provides an estimate of body fat based on height and weight. Your caregiver can help determine your BMI, and can help you achieve or maintain a healthy weight.For adults 20 years and older:  A BMI below 18.5 is considered underweight.  A BMI of 18.5 to 24.9 is normal.  A BMI of 25 to 29.9 is considered overweight.  A BMI of 30 and above is  considered obese.  Maintain normal blood lipids and cholesterol levels by exercising and minimizing your intake of saturated fat. Eat a balanced diet with plenty of fruit and vegetables. Blood tests for lipids and cholesterol should begin at age 20 and be repeated every 5 years. If your lipid or cholesterol levels are high, you are over 50, or you are at high risk for heart disease, you may need your cholesterol levels checked more frequently.Ongoing high lipid and cholesterol levels should be treated with medicines if diet and exercise are not effective.  If you smoke, find out from your caregiver how to quit. If you do not use tobacco, do not start.  If you are pregnant, do not drink alcohol. If you are breastfeeding, be very cautious about drinking alcohol. If you are not pregnant and choose to drink alcohol, do not exceed 1 drink per day. One drink is considered to be 12 ounces (355 mL) of beer, 5 ounces (148 mL) of wine, or 1.5 ounces (44 mL) of liquor.  Avoid use of street drugs. Do not share needles with anyone. Ask for help if you need support or instructions about stopping the use of drugs.  High blood pressure causes heart disease and increases the risk of stroke. Your blood pressure should be checked at least every 1 to 2 years. Ongoing high blood pressure should be treated with medicines if weight loss and exercise are not effective.  If you are 55 to 76 years old, ask your caregiver if you should take aspirin to prevent strokes.  Diabetes   screening involves taking a blood sample to check your fasting blood sugar level. This should be done once every 3 years, after age 45, if you are within normal weight and without risk factors for diabetes. Testing should be considered at a younger age or be carried out more frequently if you are overweight and have at least 1 risk factor for diabetes.  Breast cancer screening is essential preventive care for women. You should practice "breast  self-awareness." This means understanding the normal appearance and feel of your breasts and may include breast self-examination. Any changes detected, no matter how small, should be reported to a caregiver. Women in their 20s and 30s should have a clinical breast exam (CBE) by a caregiver as part of a regular health exam every 1 to 3 years. After age 40, women should have a CBE every year. Starting at age 40, women should consider having a mammography (breast X-ray test) every year. Women who have a family history of breast cancer should talk to their caregiver about genetic screening. Women at a high risk of breast cancer should talk to their caregivers about having magnetic resonance imaging (MRI) and a mammography every year.  The Pap test is a screening test for cervical cancer. A Pap test can show cell changes on the cervix that might become cervical cancer if left untreated. A Pap test is a procedure in which cells are obtained and examined from the lower end of the uterus (cervix).  Women should have a Pap test starting at age 21.  Between ages 21 and 29, Pap tests should be repeated every 2 years.  Beginning at age 30, you should have a Pap test every 3 years as long as the past 3 Pap tests have been normal.  Some women have medical problems that increase the chance of getting cervical cancer. Talk to your caregiver about these problems. It is especially important to talk to your caregiver if a new problem develops soon after your last Pap test. In these cases, your caregiver may recommend more frequent screening and Pap tests.  The above recommendations are the same for women who have or have not gotten the vaccine for human papillomavirus (HPV).  If you had a hysterectomy for a problem that was not cancer or a condition that could lead to cancer, then you no longer need Pap tests. Even if you no longer need a Pap test, a regular exam is a good idea to make sure no other problems are  starting.  If you are between ages 65 and 70, and you have had normal Pap tests going back 10 years, you no longer need Pap tests. Even if you no longer need a Pap test, a regular exam is a good idea to make sure no other problems are starting.  If you have had past treatment for cervical cancer or a condition that could lead to cancer, you need Pap tests and screening for cancer for at least 20 years after your treatment.  If Pap tests have been discontinued, risk factors (such as a new sexual partner) need to be reassessed to determine if screening should be resumed.  The HPV test is an additional test that may be used for cervical cancer screening. The HPV test looks for the virus that can cause the cell changes on the cervix. The cells collected during the Pap test can be tested for HPV. The HPV test could be used to screen women aged 30 years and older, and should   be used in women of any age who have unclear Pap test results. After the age of 30, women should have HPV testing at the same frequency as a Pap test.  Colorectal cancer can be detected and often prevented. Most routine colorectal cancer screening begins at the age of 50 and continues through age 75. However, your caregiver may recommend screening at an earlier age if you have risk factors for colon cancer. On a yearly basis, your caregiver may provide home test kits to check for hidden blood in the stool. Use of a small camera at the end of a tube, to directly examine the colon (sigmoidoscopy or colonoscopy), can detect the earliest forms of colorectal cancer. Talk to your caregiver about this at age 50, when routine screening begins. Direct examination of the colon should be repeated every 5 to 10 years through age 75, unless early forms of pre-cancerous polyps or small growths are found.  Hepatitis C blood testing is recommended for all people born from 1945 through 1965 and any individual with known risks for hepatitis C.  Practice  safe sex. Use condoms and avoid high-risk sexual practices to reduce the spread of sexually transmitted infections (STIs). STIs include gonorrhea, chlamydia, syphilis, trichomonas, herpes, HPV, and human immunodeficiency virus (HIV). Herpes, HIV, and HPV are viral illnesses that have no cure. They can result in disability, cancer, and death. Sexually active women aged 25 and younger should be checked for chlamydia. Older women with new or multiple partners should also be tested for chlamydia. Testing for other STIs is recommended if you are sexually active and at increased risk.  Osteoporosis is a disease in which the bones lose minerals and strength with aging. This can result in serious bone fractures. The risk of osteoporosis can be identified using a bone density scan. Women ages 65 and over and women at risk for fractures or osteoporosis should discuss screening with their caregivers. Ask your caregiver whether you should take a calcium supplement or vitamin D to reduce the rate of osteoporosis.  Menopause can be associated with physical symptoms and risks. Hormone replacement therapy is available to decrease symptoms and risks. You should talk to your caregiver about whether hormone replacement therapy is right for you.  Use sunscreen with sun protection factor (SPF) of 30 or more. Apply sunscreen liberally and repeatedly throughout the day. You should seek shade when your shadow is shorter than you. Protect yourself by wearing long sleeves, pants, a wide-brimmed hat, and sunglasses year round, whenever you are outdoors.  Once a month, do a whole body skin exam, using a mirror to look at the skin on your back. Notify your caregiver of new moles, moles that have irregular borders, moles that are larger than a pencil eraser, or moles that have changed in shape or color.  Stay current with required immunizations.  Influenza. You need a dose every fall (or winter). The composition of the flu vaccine  changes each year, so being vaccinated once is not enough.  Pneumococcal polysaccharide. You need 1 to 2 doses if you smoke cigarettes or if you have certain chronic medical conditions. You need 1 dose at age 65 (or older) if you have never been vaccinated.  Tetanus, diphtheria, pertussis (Tdap, Td). Get 1 dose of Tdap vaccine if you are younger than age 65, are over 65 and have contact with an infant, are a healthcare worker, are pregnant, or simply want to be protected from whooping cough. After that, you need a Td   booster dose every 10 years. Consult your caregiver if you have not had at least 3 tetanus and diphtheria-containing shots sometime in your life or have a deep or dirty wound.  HPV. You need this vaccine if you are a woman age 26 or younger. The vaccine is given in 3 doses over 6 months.  Measles, mumps, rubella (MMR). You need at least 1 dose of MMR if you were born in 1957 or later. You may also need a second dose.  Meningococcal. If you are age 19 to 21 and a first-year college student living in a residence hall, or have one of several medical conditions, you need to get vaccinated against meningococcal disease. You may also need additional booster doses.  Zoster (shingles). If you are age 60 or older, you should get this vaccine.  Varicella (chickenpox). If you have never had chickenpox or you were vaccinated but received only 1 dose, talk to your caregiver to find out if you need this vaccine.  Hepatitis A. You need this vaccine if you have a specific risk factor for hepatitis A virus infection or you simply wish to be protected from this disease. The vaccine is usually given as 2 doses, 6 to 18 months apart.  Hepatitis B. You need this vaccine if you have a specific risk factor for hepatitis B virus infection or you simply wish to be protected from this disease. The vaccine is given in 3 doses, usually over 6 months. Preventive Services / Frequency Ages 19 to 39  Blood  pressure check.** / Every 1 to 2 years.  Lipid and cholesterol check.** / Every 5 years beginning at age 20.  Clinical breast exam.** / Every 3 years for women in their 20s and 30s.  Pap test.** / Every 2 years from ages 21 through 29. Every 3 years starting at age 30 through age 65 or 70 with a history of 3 consecutive normal Pap tests.  HPV screening.** / Every 3 years from ages 30 through ages 65 to 70 with a history of 3 consecutive normal Pap tests.  Hepatitis C blood test.** / For any individual with known risks for hepatitis C.  Skin self-exam. / Monthly.  Influenza immunization.** / Every year.  Pneumococcal polysaccharide immunization.** / 1 to 2 doses if you smoke cigarettes or if you have certain chronic medical conditions.  Tetanus, diphtheria, pertussis (Tdap, Td) immunization. / A one-time dose of Tdap vaccine. After that, you need a Td booster dose every 10 years.  HPV immunization. / 3 doses over 6 months, if you are 26 and younger.  Measles, mumps, rubella (MMR) immunization. / You need at least 1 dose of MMR if you were born in 1957 or later. You may also need a second dose.  Meningococcal immunization. / 1 dose if you are age 19 to 21 and a first-year college student living in a residence hall, or have one of several medical conditions, you need to get vaccinated against meningococcal disease. You may also need additional booster doses.  Varicella immunization.** / Consult your caregiver.  Hepatitis A immunization.** / Consult your caregiver. 2 doses, 6 to 18 months apart.  Hepatitis B immunization.** / Consult your caregiver. 3 doses usually over 6 months. Ages 40 to 64  Blood pressure check.** / Every 1 to 2 years.  Lipid and cholesterol check.** / Every 5 years beginning at age 20.  Clinical breast exam.** / Every year after age 40.  Mammogram.** / Every year beginning at age 40   and continuing for as long as you are in good health. Consult with your  caregiver.  Pap test.** / Every 3 years starting at age 30 through age 65 or 70 with a history of 3 consecutive normal Pap tests.  HPV screening.** / Every 3 years from ages 30 through ages 65 to 70 with a history of 3 consecutive normal Pap tests.  Fecal occult blood test (FOBT) of stool. / Every year beginning at age 50 and continuing until age 75. You may not need to do this test if you get a colonoscopy every 10 years.  Flexible sigmoidoscopy or colonoscopy.** / Every 5 years for a flexible sigmoidoscopy or every 10 years for a colonoscopy beginning at age 50 and continuing until age 75.  Hepatitis C blood test.** / For all people born from 1945 through 1965 and any individual with known risks for hepatitis C.  Skin self-exam. / Monthly.  Influenza immunization.** / Every year.  Pneumococcal polysaccharide immunization.** / 1 to 2 doses if you smoke cigarettes or if you have certain chronic medical conditions.  Tetanus, diphtheria, pertussis (Tdap, Td) immunization.** / A one-time dose of Tdap vaccine. After that, you need a Td booster dose every 10 years.  Measles, mumps, rubella (MMR) immunization. / You need at least 1 dose of MMR if you were born in 1957 or later. You may also need a second dose.  Varicella immunization.** / Consult your caregiver.  Meningococcal immunization.** / Consult your caregiver.  Hepatitis A immunization.** / Consult your caregiver. 2 doses, 6 to 18 months apart.  Hepatitis B immunization.** / Consult your caregiver. 3 doses, usually over 6 months. Ages 65 and over  Blood pressure check.** / Every 1 to 2 years.  Lipid and cholesterol check.** / Every 5 years beginning at age 20.  Clinical breast exam.** / Every year after age 40.  Mammogram.** / Every year beginning at age 40 and continuing for as long as you are in good health. Consult with your caregiver.  Pap test.** / Every 3 years starting at age 30 through age 65 or 70 with a 3  consecutive normal Pap tests. Testing can be stopped between 65 and 70 with 3 consecutive normal Pap tests and no abnormal Pap or HPV tests in the past 10 years.  HPV screening.** / Every 3 years from ages 30 through ages 65 or 70 with a history of 3 consecutive normal Pap tests. Testing can be stopped between 65 and 70 with 3 consecutive normal Pap tests and no abnormal Pap or HPV tests in the past 10 years.  Fecal occult blood test (FOBT) of stool. / Every year beginning at age 50 and continuing until age 75. You may not need to do this test if you get a colonoscopy every 10 years.  Flexible sigmoidoscopy or colonoscopy.** / Every 5 years for a flexible sigmoidoscopy or every 10 years for a colonoscopy beginning at age 50 and continuing until age 75.  Hepatitis C blood test.** / For all people born from 1945 through 1965 and any individual with known risks for hepatitis C.  Osteoporosis screening.** / A one-time screening for women ages 65 and over and women at risk for fractures or osteoporosis.  Skin self-exam. / Monthly.  Influenza immunization.** / Every year.  Pneumococcal polysaccharide immunization.** / 1 dose at age 65 (or older) if you have never been vaccinated.  Tetanus, diphtheria, pertussis (Tdap, Td) immunization. / A one-time dose of Tdap vaccine if you are over   65 and have contact with an infant, are a healthcare worker, or simply want to be protected from whooping cough. After that, you need a Td booster dose every 10 years.  Varicella immunization.** / Consult your caregiver.  Meningococcal immunization.** / Consult your caregiver.  Hepatitis A immunization.** / Consult your caregiver. 2 doses, 6 to 18 months apart.  Hepatitis B immunization.** / Check with your caregiver. 3 doses, usually over 6 months. ** Family history and personal history of risk and conditions may change your caregiver's recommendations. Document Released: 11/14/2001 Document Revised: 12/11/2011  Document Reviewed: 02/13/2011 [employer] Patient Information 2013 [employer].  

## 2012-08-28 ENCOUNTER — Other Ambulatory Visit: Payer: Self-pay | Admitting: Family Medicine

## 2012-09-20 ENCOUNTER — Telehealth: Payer: Self-pay

## 2012-09-20 NOTE — Telephone Encounter (Signed)
FYI------I called patient to maker her aware her benefit has been approved for her Prolia and she declined, she stated she does not have transportation to come to the office to have it done.    KP

## 2012-09-20 NOTE — Telephone Encounter (Signed)
Pt changed her mind---she will come in

## 2012-12-26 ENCOUNTER — Encounter: Payer: Self-pay | Admitting: *Deleted

## 2012-12-31 ENCOUNTER — Ambulatory Visit: Payer: Self-pay | Admitting: Podiatry

## 2013-01-14 ENCOUNTER — Other Ambulatory Visit: Payer: Self-pay | Admitting: Family Medicine

## 2013-01-20 ENCOUNTER — Encounter (HOSPITAL_BASED_OUTPATIENT_CLINIC_OR_DEPARTMENT_OTHER): Payer: Self-pay | Admitting: *Deleted

## 2013-01-20 ENCOUNTER — Emergency Department (HOSPITAL_BASED_OUTPATIENT_CLINIC_OR_DEPARTMENT_OTHER): Payer: Medicare Other

## 2013-01-20 ENCOUNTER — Other Ambulatory Visit (HOSPITAL_BASED_OUTPATIENT_CLINIC_OR_DEPARTMENT_OTHER): Payer: Medicare Other

## 2013-01-20 ENCOUNTER — Emergency Department (HOSPITAL_BASED_OUTPATIENT_CLINIC_OR_DEPARTMENT_OTHER)
Admission: EM | Admit: 2013-01-20 | Discharge: 2013-01-20 | Disposition: A | Discharge status: Home / Self Care | Payer: Medicare Other | Attending: Emergency Medicine | Admitting: Emergency Medicine

## 2013-01-20 DIAGNOSIS — W19XXXA Unspecified fall, initial encounter: Secondary | ICD-10-CM

## 2013-01-20 DIAGNOSIS — M549 Dorsalgia, unspecified: Secondary | ICD-10-CM | POA: Insufficient documentation

## 2013-01-20 DIAGNOSIS — Y9389 Activity, other specified: Secondary | ICD-10-CM | POA: Insufficient documentation

## 2013-01-20 DIAGNOSIS — S2231XA Fracture of one rib, right side, initial encounter for closed fracture: Secondary | ICD-10-CM

## 2013-01-20 DIAGNOSIS — M810 Age-related osteoporosis without current pathological fracture: Secondary | ICD-10-CM | POA: Insufficient documentation

## 2013-01-20 DIAGNOSIS — I10 Essential (primary) hypertension: Secondary | ICD-10-CM | POA: Insufficient documentation

## 2013-01-20 DIAGNOSIS — W010XXA Fall on same level from slipping, tripping and stumbling without subsequent striking against object, initial encounter: Secondary | ICD-10-CM | POA: Insufficient documentation

## 2013-01-20 DIAGNOSIS — Z7982 Long term (current) use of aspirin: Secondary | ICD-10-CM | POA: Insufficient documentation

## 2013-01-20 DIAGNOSIS — Y92009 Unspecified place in unspecified non-institutional (private) residence as the place of occurrence of the external cause: Secondary | ICD-10-CM | POA: Insufficient documentation

## 2013-01-20 DIAGNOSIS — Z79899 Other long term (current) drug therapy: Secondary | ICD-10-CM | POA: Insufficient documentation

## 2013-01-20 DIAGNOSIS — S2249XA Multiple fractures of ribs, unspecified side, initial encounter for closed fracture: Secondary | ICD-10-CM | POA: Insufficient documentation

## 2013-01-20 DIAGNOSIS — Z8781 Personal history of (healed) traumatic fracture: Secondary | ICD-10-CM | POA: Insufficient documentation

## 2013-01-20 DIAGNOSIS — Z87448 Personal history of other diseases of urinary system: Secondary | ICD-10-CM | POA: Insufficient documentation

## 2013-01-20 DIAGNOSIS — Z9889 Other specified postprocedural states: Secondary | ICD-10-CM | POA: Insufficient documentation

## 2013-01-20 DIAGNOSIS — IMO0002 Reserved for concepts with insufficient information to code with codable children: Secondary | ICD-10-CM | POA: Insufficient documentation

## 2013-01-20 DIAGNOSIS — N390 Urinary tract infection, site not specified: Secondary | ICD-10-CM

## 2013-01-20 LAB — URINALYSIS, ROUTINE W REFLEX MICROSCOPIC
Glucose, UA: NEGATIVE mg/dL
Hgb urine dipstick: NEGATIVE
Protein, ur: NEGATIVE mg/dL
Specific Gravity, Urine: 1.019 (ref 1.005–1.030)
pH: 7.5 (ref 5.0–8.0)

## 2013-01-20 LAB — CBC WITH DIFFERENTIAL/PLATELET
Eosinophils Relative: 3 % (ref 0–5)
Hemoglobin: 12.7 g/dL (ref 12.0–15.0)
Lymphocytes Relative: 12 % (ref 12–46)
Lymphs Abs: 1.3 10*3/uL (ref 0.7–4.0)
MCV: 86.1 fL (ref 78.0–100.0)
Monocytes Relative: 11 % (ref 3–12)
Platelets: 323 10*3/uL (ref 150–400)
RBC: 4.23 MIL/uL (ref 3.87–5.11)
WBC: 11.5 10*3/uL — ABNORMAL HIGH (ref 4.0–10.5)

## 2013-01-20 LAB — BASIC METABOLIC PANEL
BUN: 21 mg/dL (ref 6–23)
CO2: 27 mEq/L (ref 19–32)
Glucose, Bld: 102 mg/dL — ABNORMAL HIGH (ref 70–99)
Potassium: 3.6 mEq/L (ref 3.5–5.1)
Sodium: 134 mEq/L — ABNORMAL LOW (ref 135–145)

## 2013-01-20 LAB — URINE MICROSCOPIC-ADD ON

## 2013-01-20 LAB — PROTIME-INR: INR: 1.03 (ref 0.00–1.49)

## 2013-01-20 MED ORDER — FENTANYL CITRATE 0.05 MG/ML IJ SOLN
50.0000 ug | Freq: Once | INTRAMUSCULAR | Status: AC
  Administered 2013-01-20: 50 ug via INTRAVENOUS
  Filled 2013-01-20: qty 2

## 2013-01-20 MED ORDER — IOHEXOL 300 MG/ML  SOLN
100.0000 mL | Freq: Once | INTRAMUSCULAR | Status: AC | PRN
  Administered 2013-01-20: 100 mL via INTRAVENOUS

## 2013-01-20 MED ORDER — HYDROCODONE-ACETAMINOPHEN 5-325 MG PO TABS: 2.0000 | ORAL_TABLET | ORAL | Status: DC | PRN

## 2013-01-20 MED ORDER — CEPHALEXIN 500 MG PO CAPS: 500.0000 mg | ORAL_CAPSULE | Freq: Four times a day (QID) | ORAL | Status: DC

## 2013-01-20 NOTE — ED Notes (Signed)
Pt fell at daughters house got her up no problem didn't want to go to dr awoke around midnight and was in pain states she thinks she may have fractured her ribs

## 2013-01-20 NOTE — ED Provider Notes (Signed)
History    This chart was scribed for Deborah Octave, MD by [name], ED Scribe. This patient was seen in room MH04/MH04 and the patient's care was started 1310.    CSN: [id number]  Arrival date & time 01/20/13  1310   First MD Initiated Contact with Patient 01/20/13 1459      Chief Complaint  Patient presents with  . Fall    The history is provided by the patient. No language interpreter was used.    [name] is a 79 y.o. female who presents to the Emergency Department complaining of non radiating pain across the back that started after a  fall that occurred yesterday evening. Pt began to have pain a few hours after the fall to the back and ribs. Pt states the fall occurred in the bathroom after a trip. Pain is worse with movement such as twisting and bending. She denies head injury, LOC, chest pain, abdominal pain, hip pain, head pain, neck pain, change in bowel or bladder function. Pt denies taking blood thinners. Pt has a h/o broken ribs.    Past Medical History  Diagnosis Date  . Hypertension   . Osteoporosis   . Overactive bladder     Past Surgical History  Procedure Laterality Date  . Abdominal hysterectomy      TAH BSO  . Spine surgery  1980    Lumbar surgery    Family History  Problem Relation Age of Onset  . Depression    . Heart attack    . Coronary artery disease    . Coronary artery disease Mother 61  . Heart attack Son 40    History  Substance Use Topics  . Smoking status: Never Smoker   . Smokeless tobacco: Never Used  . Alcohol Use: Yes    OB History   Grav Para Term Preterm Abortions TAB SAB Ect Mult Living                  Review of Systems A complete 10 system review of systems was obtained and all systems are negative except as noted in the HPI and PMH.    Allergies  Review of patient's allergies indicates no known allergies.  Home Medications   Current Outpatient Rx  Name  Route  Sig  Dispense  Refill  . amLODipine  (NORVASC) 5 MG tablet   Oral   Take 1 tablet (5 mg total) by mouth daily.   30 tablet   5   . amLODipine (NORVASC) 5 MG tablet   Oral   Take 1 tablet (5 mg total) by mouth daily.   30 tablet   11   . aspirin 81 MG tablet   Oral   Take 81 mg by mouth daily.           . Calcium Carbonate (CALTRATE 600) 1500 MG TABS   Oral   Take 1 tablet by mouth 2 (two) times daily.           Marland Kitchen denosumab (PROLIA) 60 MG/ML SOLN   Subcutaneous   Inject 60 mg into the skin every 6 (six) months.           . fish oil-omega-3 fatty acids 1000 MG capsule   Oral   Take 2 g by mouth daily.           . hydrochlorothiazide (HYDRODIURIL) 25 MG tablet      TAKE 1 TABLET EVERY DAY   30 tablet   1   .  neomycin-bacitracin-polymyxin (NEOSPORIN) ointment      Apply to affected area bid prn   15 g   2   . oxybutynin (DITROPAN-XL) 5 MG 24 hr tablet      TAKE 1 TABLET BY MOUTH EVERY DAY   30 tablet   0   . polyethylene glycol powder (MIRALAX) powder   Oral   Take 17 g by mouth as directed.             BP 131/57  Pulse 74  Temp(Src) 97.8 F (36.6 C) (Oral)  Resp 18  Ht 5\' 4"  (1.626 m)  Wt 120 lb (54.432 kg)  BMI 20.59 kg/m2  SpO2 100%  Physical Exam  Nursing note and vitals reviewed. Constitutional: She is oriented to person, place, and time. She appears well-developed and well-nourished. No distress.  HENT:  Head: Normocephalic and atraumatic.  Eyes: EOM are normal.  Neck: Neck supple. No tracheal deviation present.  Cardiovascular: Normal rate, regular rhythm and normal heart sounds.   Pulmonary/Chest: Effort normal and breath sounds normal. No respiratory distress. She has no wheezes. She has no rales.  Musculoskeletal: Normal range of motion.  Right alter rib tenderness, no ecchymosis, no creptitus, no C, T, L spine pain.   Neurological: She is alert and oriented to person, place, and time.  Skin: Skin is warm and dry.  Psychiatric: She has a normal mood and affect.  Her behavior is normal.    ED Course  Procedures (including critical care time)  DIAGNOSTIC STUDIES: Oxygen Saturation is 100% on room air, normal by my interpretation.    COORDINATION OF CARE: 3:06 PM-Discussed treatment plan with pt at bedside and pt agreed to plan. .   Labs Reviewed  URINALYSIS, ROUTINE W REFLEX MICROSCOPIC - Abnormal; Notable for the following:    APPearance CLOUDY (*)    Ketones, ur 15 (*)    Leukocytes, UA LARGE (*)    All other components within normal limits  URINE MICROSCOPIC-ADD ON - Abnormal; Notable for the following:    Squamous Epithelial / LPF FEW (*)    Bacteria, UA MANY (*)    All other components within normal limits  CBC WITH DIFFERENTIAL - Abnormal; Notable for the following:    WBC 11.5 (*)    Neutro Abs 8.5 (*)    Monocytes Absolute 1.3 (*)    All other components within normal limits  BASIC METABOLIC PANEL - Abnormal; Notable for the following:    Sodium 134 (*)    Glucose, Bld 102 (*)    GFR calc non Af Amer 78 (*)    All other components within normal limits  URINE CULTURE  PROTIME-INR   Dg Ribs Bilateral W/chest  01/20/2013  *RADIOLOGY REPORT*  Clinical Data: Bilateral rib pain following recent injury  BILATERAL RIBS AND CHEST - 4+ VIEW  Comparison: None.  Findings: The heart and pulmonary vascularity are within normal limits.  The lungs are well-aerated bilaterally without evidence of pneumothorax.  There are multiple rib fractures identified on the right.  Some are acute and in the seventh and eighth ribs laterally.  There are also some healing fracture is identified of the fourth and fifth right ribs laterally.  No rib fractures are noted on the left.  IMPRESSION: Acute and chronic rib fractures on the right as described.  No pneumothorax is noted.   Original Report Authenticated By: Alcide Clever, M.D.    Ct Head Wo Contrast  01/20/2013  *RADIOLOGY REPORT*  Clinical Data:  Fall.  Pain in back.  Hypertension.  CT HEAD WITHOUT CONTRAST  CT CERVICAL SPINE WITHOUT CONTRAST  Technique:  Multidetector CT imaging of the head and cervical spine was performed following the standard protocol without intravenous contrast.  Multiplanar CT image reconstructions of the cervical spine were also generated.  Comparison:   None  CT HEAD  Findings: The brain stem, cerebellum, cerebral peduncles, thalami, basal ganglia, basilar cisterns, and ventricular system appear unremarkable.  No intracranial hemorrhage, mass lesion, or acute infarction is identified.  Chronic ethmoid and right sphenoid sinusitis noted.  Dense atherosclerotic calcification of the carotid siphons is observed.  IMPRESSION:  1.  Chronic ethmoid and sphenoid sinusitis. 2.  Atherosclerosis. 3.  No acute intracranial findings.  CT CERVICAL SPINE  Findings: C1-2 arthropathy noted anteriorly with calcified pannus along the articulation and mild chronic erosion along the posterior margin of the odontoid.  Faint calcifications along the articulation noted.  There is fusion of the right C2-3 facet along with 2.5 mm of degenerative anterolisthesis at C2-3.  There is 2 mm of degenerative anterolisthesis at C3-4 with left uncinate and facet spurring causing osseous foraminal stenosis.  Kyphotic angulation of the C4-5 level noted with severe loss of intervertebral disc spaces and posterior osseous ridging with uncinate and facet spurring causing osseous foraminal stenosis on the left.  There is also fusion of the left C4-5 facet joint.  At C5-6, C6-7, and C7-T1 there is loss of intervertebral disc height and posterior osseous ridging.  Osseous foraminal stenosis noted on the right at each of these levels, and on the left at C7- T1.  Atherosclerotic calcification of the carotid siphons noted. Multinodular goiter of the thyroid gland observed.  IMPRESSION:  1.  Cervical spondylosis and degenerative disc disease, with pannus formation at the anterior C1-2 articulation and with multilevel osseous foraminal  impingement, but without acute fracture or acute subluxation observed. 2.  Atherosclerotic calcification in the carotid bulbs. 3.  Multinodular goiter.   Original Report Authenticated By: Gaylyn Rong, M.D.    Ct Chest W Contrast  01/20/2013  *RADIOLOGY REPORT*  Clinical Data:  Fall.  Upper back pain.  History hypertension. Osteoporosis.  CT CHEST, ABDOMEN AND PELVIS WITH CONTRAST  Technique: Contiguous axial images of the chest abdomen and pelvis were obtained after IV contrast administration.  Contrast: 100  ml Omnipaque-300  Comparison: Plain films earlier today chest.  No prior CT.  CT CHEST  Findings: Lung windows demonstrate area of pleural or subpleural nodularity at the right lower lobe which measures 9 mm on image 44/series 5. The more anterior pleural based focus along the right middle lobe which measures 5 mm on image 43/series 5  7 mm left lower lobe lung nodule on image 45. No pneumothorax.  Mild scarring within the lingula.  Soft tissue windows demonstrate enlarged thyroid, greater left than right.  Dominant lower pole left sided mass or masses measure 2.8 cm and cause displacement of the trachea to the right.  The arms not raised above the head.  No evidence of aortic laceration or mediastinal hematoma.  Moderate cardiomegaly with coronary artery atherosclerosis. No pericardial or pleural effusion.  Pulmonary outflow tract enlargement 3.3 cm. No mediastinal or hilar adenopathy.  Remote superior right-sided rib fractures.  Minimally displaced fractures of the seventh through eighth posterolateral right ribs.  IMPRESSION:  1.  Acute right-sided rib fractures, without pneumothorax or pleural fluid. 2.  Pulmonary and pleural based nodules up to 9 mm.  Correlate with any history of primary malignancy.  Recommend follow-up chest CT at 3 months. 3. Pulmonary artery enlargement suggests pulmonary arterial hypertension. 4.  Enlargement of thyroid, with a dominant nodule or nodules in the lower pole  left lobe.  Consider non emergent thyroid ultrasound.  CT ABDOMEN AND PELVIS  Findings:  Mild motion degradation throughout.  Too small to characterize left liver lobe lesion.  Normal spleen.  Tiny gastric diverticulum.  Proximal gastric underdistension.  Suspect one or more large duodenal diverticula.  Normal pancreas, gallbladder, biliary tract, adrenal glands, kidneys  Aortic and branch vessel atherosclerosis.  Nonaneurysmal infrarenal aortic dilatation at and 2.9 cm.  Extensive colonic diverticulosis.  Large amount of colonic stool. No pneumatosis or free intraperitoneal air.  Normal small bowel without abdominal ascites.  No pelvic adenopathy.  Hysterectomy.  Normal urinary bladder. No adnexal mass or significant free fluid.  No significant free fluid.  Moderate osteopenia.  Advanced lower lumbar spondylosis.  IMPRESSION:  1.  Motion degraded evaluation of the abdomen and pelvis. 2.  No post-traumatic deformity identified within the abdomen or pelvis.   Original Report Authenticated By: Jeronimo Greaves, M.D.    Ct Cervical Spine Wo Contrast  01/20/2013  *RADIOLOGY REPORT*  Clinical Data:  Fall.  Pain in back.  Hypertension.  CT HEAD WITHOUT CONTRAST CT CERVICAL SPINE WITHOUT CONTRAST  Technique:  Multidetector CT imaging of the head and cervical spine was performed following the standard protocol without intravenous contrast.  Multiplanar CT image reconstructions of the cervical spine were also generated.  Comparison:   None  CT HEAD  Findings: The brain stem, cerebellum, cerebral peduncles, thalami, basal ganglia, basilar cisterns, and ventricular system appear unremarkable.  No intracranial hemorrhage, mass lesion, or acute infarction is identified.  Chronic ethmoid and right sphenoid sinusitis noted.  Dense atherosclerotic calcification of the carotid siphons is observed.  IMPRESSION:  1.  Chronic ethmoid and sphenoid sinusitis. 2.  Atherosclerosis. 3.  No acute intracranial findings.  CT CERVICAL SPINE   Findings: C1-2 arthropathy noted anteriorly with calcified pannus along the articulation and mild chronic erosion along the posterior margin of the odontoid.  Faint calcifications along the articulation noted.  There is fusion of the right C2-3 facet along with 2.5 mm of degenerative anterolisthesis at C2-3.  There is 2 mm of degenerative anterolisthesis at C3-4 with left uncinate and facet spurring causing osseous foraminal stenosis.  Kyphotic angulation of the C4-5 level noted with severe loss of intervertebral disc spaces and posterior osseous ridging with uncinate and facet spurring causing osseous foraminal stenosis on the left.  There is also fusion of the left C4-5 facet joint.  At C5-6, C6-7, and C7-T1 there is loss of intervertebral disc height and posterior osseous ridging.  Osseous foraminal stenosis noted on the right at each of these levels, and on the left at C7- T1.  Atherosclerotic calcification of the carotid siphons noted. Multinodular goiter of the thyroid gland observed.  IMPRESSION:  1.  Cervical spondylosis and degenerative disc disease, with pannus formation at the anterior C1-2 articulation and with multilevel osseous foraminal impingement, but without acute fracture or acute subluxation observed. 2.  Atherosclerotic calcification in the carotid bulbs. 3.  Multinodular goiter.   Original Report Authenticated By: Gaylyn Rong, M.D.    Ct Abdomen Pelvis W Contrast  01/20/2013  *RADIOLOGY REPORT*  Clinical Data:  Fall.  Upper back pain.  History hypertension. Osteoporosis.  CT CHEST, ABDOMEN AND PELVIS WITH CONTRAST  Technique: Contiguous axial images of the chest abdomen and pelvis were obtained after IV  contrast administration.  Contrast: 100  ml Omnipaque-300  Comparison: Plain films earlier today chest.  No prior CT.  CT CHEST  Findings: Lung windows demonstrate area of pleural or subpleural nodularity at the right lower lobe which measures 9 mm on image 44/series 5. The more  anterior pleural based focus along the right middle lobe which measures 5 mm on image 43/series 5  7 mm left lower lobe lung nodule on image 45. No pneumothorax.  Mild scarring within the lingula.  Soft tissue windows demonstrate enlarged thyroid, greater left than right.  Dominant lower pole left sided mass or masses measure 2.8 cm and cause displacement of the trachea to the right.  The arms not raised above the head.  No evidence of aortic laceration or mediastinal hematoma.  Moderate cardiomegaly with coronary artery atherosclerosis. No pericardial or pleural effusion.  Pulmonary outflow tract enlargement 3.3 cm. No mediastinal or hilar adenopathy.  Remote superior right-sided rib fractures.  Minimally displaced fractures of the seventh through eighth posterolateral right ribs.  IMPRESSION:  1.  Acute right-sided rib fractures, without pneumothorax or pleural fluid. 2.  Pulmonary and pleural based nodules up to 9 mm.  Correlate with any history of primary malignancy.  Recommend follow-up chest CT at 3 months. 3. Pulmonary artery enlargement suggests pulmonary arterial hypertension. 4.  Enlargement of thyroid, with a dominant nodule or nodules in the lower pole left lobe.  Consider non emergent thyroid ultrasound.  CT ABDOMEN AND PELVIS  Findings:  Mild motion degradation throughout.  Too small to characterize left liver lobe lesion.  Normal spleen.  Tiny gastric diverticulum.  Proximal gastric underdistension.  Suspect one or more large duodenal diverticula.  Normal pancreas, gallbladder, biliary tract, adrenal glands, kidneys  Aortic and branch vessel atherosclerosis.  Nonaneurysmal infrarenal aortic dilatation at and 2.9 cm.  Extensive colonic diverticulosis.  Large amount of colonic stool. No pneumatosis or free intraperitoneal air.  Normal small bowel without abdominal ascites.  No pelvic adenopathy.  Hysterectomy.  Normal urinary bladder. No adnexal mass or significant free fluid.  No significant free  fluid.  Moderate osteopenia.  Advanced lower lumbar spondylosis.  IMPRESSION:  1.  Motion degraded evaluation of the abdomen and pelvis. 2.  No post-traumatic deformity identified within the abdomen or pelvis.   Original Report Authenticated By: Jeronimo Greaves, M.D.      No diagnosis found.    MDM  Mechanical fall yesterday With R rib pain.  Uncertain hitting head or LOC.  At mental baseline.  No SOB, abdominal pain or chest pain.  Posterior right-sided seventh and eighth rib fractures. No pneumothorax. No hematuria.  Patient without any difficulty breathing or hypoxia. 100% on room air. Imaging results discussed with patient and son. Patient will followup with PCP regarding lung nodules in thyromegaly.   I personally performed the services described in this documentation, which was scribed in my presence. The recorded information has been reviewed and is accurate.   Deborah Octave, MD 01/20/13 [phone]

## 2013-01-20 NOTE — ED Notes (Signed)
MD at bedside. 

## 2013-01-22 ENCOUNTER — Telehealth: Payer: Self-pay | Admitting: General Practice

## 2013-01-22 LAB — URINE CULTURE

## 2013-01-22 NOTE — Telephone Encounter (Signed)
Pt daughter called and stated that CNA that comes to the home gave the pt mom a second dosage of her amlodipine. Per Dois Davenport BP is going to be monitored if systolic reaches 161 then Pt daughter advised to take pt to the ER.

## 2013-01-23 ENCOUNTER — Telehealth (HOSPITAL_COMMUNITY): Payer: Self-pay | Admitting: Emergency Medicine

## 2013-01-26 ENCOUNTER — Telehealth (HOSPITAL_COMMUNITY): Payer: Self-pay | Admitting: Emergency Medicine

## 2013-01-26 NOTE — ED Notes (Signed)
Chart returned from EDP office. Per [facility] PA-C, likely contaminant. No further abx treatment needed.

## 2013-03-24 ENCOUNTER — Other Ambulatory Visit: Payer: Self-pay | Admitting: Family Medicine

## 2013-04-09 ENCOUNTER — Ambulatory Visit (INDEPENDENT_AMBULATORY_CARE_PROVIDER_SITE_OTHER): Payer: Medicare Other | Admitting: Podiatry

## 2013-04-09 ENCOUNTER — Encounter: Payer: Self-pay | Admitting: Podiatry

## 2013-04-09 DIAGNOSIS — L84 Corns and callosities: Secondary | ICD-10-CM

## 2013-04-09 DIAGNOSIS — B351 Tinea unguium: Secondary | ICD-10-CM | POA: Insufficient documentation

## 2013-04-09 DIAGNOSIS — N318 Other neuromuscular dysfunction of bladder: Secondary | ICD-10-CM

## 2013-04-09 DIAGNOSIS — R262 Difficulty in walking, not elsewhere classified: Secondary | ICD-10-CM

## 2013-04-09 NOTE — Progress Notes (Signed)
Subjective: Son came in with [name] wants her nails trimmed and a spot on toe is giving her trouble.  Patient walks with a walker in abnormal gait.  Objective: Right DP is palpable, left DP not palpable. PT not palpable. Positive of dry bled callus at distal end of left great toe.  Thick dystrophic nails x 10. Bilateral pedal and lower limb edema.  Assessment: Pre-ulcerative digital callus left great toe. Onychomycosis x 10. Pain left foot.  Plan: Palliation prn. Debrided all nails and pre-ulcerative callus left foot.

## 2013-04-17 ENCOUNTER — Encounter: Payer: Self-pay | Admitting: Family Medicine

## 2013-04-17 ENCOUNTER — Ambulatory Visit (INDEPENDENT_AMBULATORY_CARE_PROVIDER_SITE_OTHER): Payer: Medicare Other | Admitting: Family Medicine

## 2013-04-17 VITALS — BP 116/70 | HR 92 | Temp 97.6°F | Wt 105.2 lb

## 2013-04-17 DIAGNOSIS — F0390 Unspecified dementia without behavioral disturbance: Secondary | ICD-10-CM

## 2013-04-17 DIAGNOSIS — M810 Age-related osteoporosis without current pathological fracture: Secondary | ICD-10-CM

## 2013-04-17 DIAGNOSIS — I10 Essential (primary) hypertension: Secondary | ICD-10-CM

## 2013-04-17 MED ORDER — AMLODIPINE BESYLATE 5 MG PO TABS: 5.0000 mg | ORAL_TABLET | Freq: Every day | ORAL | Status: DC

## 2013-04-17 MED ORDER — HYDROCHLOROTHIAZIDE 25 MG PO TABS: 25.0000 mg | ORAL_TABLET | Freq: Every day | ORAL | Status: DC

## 2013-04-17 MED ORDER — DONEPEZIL HCL 5 MG PO TABS: 5.0000 mg | ORAL_TABLET | Freq: Every evening | ORAL | Status: DC | PRN

## 2013-04-17 NOTE — Patient Instructions (Addendum)
Hypertension As your heart beats, it forces blood through your arteries. This force is your blood pressure. If the pressure is too high, it is called hypertension (HTN) or high blood pressure. HTN is dangerous because you may have it and not know it. High blood pressure may mean that your heart has to work harder to pump blood. Your arteries may be narrow or stiff. The extra work puts you at risk for heart disease, stroke, and other problems.  Blood pressure consists of two numbers, a higher number over a lower, 110/72, for example. It is stated as "110 over 72." The ideal is below 120 for the top number (systolic) and under 80 for the bottom (diastolic). Write down your blood pressure today. You should pay close attention to your blood pressure if you have certain conditions such as:  Heart failure.  Prior heart attack.  Diabetes  Chronic kidney disease.  Prior stroke.  Multiple risk factors for heart disease. To see if you have HTN, your blood pressure should be measured while you are seated with your arm held at the level of the heart. It should be measured at least twice. A one-time elevated blood pressure reading (especially in the Emergency Department) does not mean that you need treatment. There may be conditions in which the blood pressure is different between your right and left arms. It is important to see your caregiver soon for a recheck. Most people have essential hypertension which means that there is not a specific cause. This type of high blood pressure may be lowered by changing lifestyle factors such as:  Stress.  Smoking.  Lack of exercise.  Excessive weight.  Drug/tobacco/alcohol use.  Eating less salt. Most people do not have symptoms from high blood pressure until it has caused damage to the body. Effective treatment can often prevent, delay or reduce that damage. TREATMENT  When a cause has been identified, treatment for high blood pressure is directed at the  cause. There are a large number of medications to treat HTN. These fall into several categories, and your caregiver will help you select the medicines that are best for you. Medications may have side effects. You should review side effects with your caregiver. If your blood pressure stays high after you have made lifestyle changes or started on medicines,   Your medication(s) may need to be changed.  Other problems may need to be addressed.  Be certain you understand your prescriptions, and know how and when to take your medicine.  Be sure to follow up with your caregiver within the time frame advised (usually within two weeks) to have your blood pressure rechecked and to review your medications.  If you are taking more than one medicine to lower your blood pressure, make sure you know how and at what times they should be taken. Taking two medicines at the same time can result in blood pressure that is too low. SEEK IMMEDIATE MEDICAL CARE IF:  You develop a severe headache, blurred or changing vision, or confusion.  You have unusual weakness or numbness, or a faint feeling.  You have severe chest or abdominal pain, vomiting, or breathing problems. MAKE SURE YOU:   Understand these instructions.  Will watch your condition.  Will get help right away if you are not doing well or get worse. Document Released: 09/18/2005 Document Revised: 12/11/2011 Document Reviewed: 05/08/2008 [facility] Patient Information 2014 [place], [place].   Dementia Dementia is a general term for problems with brain function. A person with  dementia has memory loss and a hard time with at least one other brain function such as thinking, speaking, or problem solving. Dementia can affect social functioning, how you do your job, your mood, or your personality. The changes may be hidden for a long time. The earliest forms of this disease are usually not detected by family or friends. Dementia can  be:  Irreversible.  Potentially reversible.  Partially reversible.  Progressive. This means it can get worse over time. CAUSES  Irreversible dementia causes may include:  Degeneration of brain cells (Alzheimer's disease or lewy body dementia).  Multiple small strokes (vascular dementia).  Infection (chronic meningitis or Creutzfelt-Jakob disease).  Frontotemporal dementia. This affects younger people, age 91 to 41, compared to those who have Alzheimer's disease.  Dementia associated with other disorders like Parkinson's disease, Huntington's disease, or HIV-associated dementia. Potentially or partially reversible dementia causes may include:  Medicines.  Metabolic causes such as excessive alcohol intake, vitamin B12 deficiency, or thyroid disease.  Masses or pressure in the brain such as a tumor, blood clot, or hydrocephalus. SYMPTOMS  Symptoms are often hard to detect. Family members or coworkers may not notice them early in the disease process. Different people with dementia may have different symptoms. Symptoms can include:  A hard time with memory, especially recent memory. Long-term memory may not be impaired.  Asking the same question multiple times or forgetting something someone just said.  A hard time speaking your thoughts or finding certain words.  A hard time solving problems or performing familiar tasks (such as how to use a telephone).  Sudden changes in mood.  Changes in personality, especially increasing moodiness or mistrust.  Depression.  A hard time understanding complex ideas that were never a problem in the past. DIAGNOSIS  There are no specific tests for dementia.   Your caregiver may recommend a thorough evaluation. This is because some forms of dementia can be reversible. The evaluation will likely include a physical exam and getting a detailed history from you and a family member. The history often gives the best clues and suggestions for a  diagnosis.  Memory testing may be done. A detailed brain function evaluation called neuropsychologic testing may be helpful.  Lab tests and brain imaging (such as a CT scan or MRI scan) are sometimes important.  Sometimes observation and re-evaluation over time is very helpful. TREATMENT  Treatment depends on the cause.   If the problem is a vitamin deficiency, it may be helped or cured with supplements.  For dementias such as Alzheimer's disease, medicines are available to stabilize or slow the course of the disease. There are no cures for this type of dementia.  Your caregiver can help direct you to groups, organizations, and other caregivers to help with decisions in the care of you or your loved one. HOME CARE INSTRUCTIONS The care of individuals with dementia is varied and dependent upon the progression of the dementia. The following suggestions are intended for the person living with, or caring for, the person with dementia.  Create a safe environment.  Remove the locks on bathroom doors to prevent the person from accidentally locking himself or herself in.  Use childproof latches on kitchen cabinets and any place where cleaning supplies, chemicals, or alcohol are kept.  Use childproof covers in unused electrical outlets.  Install childproof devices to keep doors and windows secured.  Remove stove knobs or install safety knobs and an automatic shut-off on the stove.  Lower the temperature on  water heaters.  Label medicines and keep them locked up.  Secure knives, lighters, matches, power tools, and guns, and keep these items out of reach.  Keep the house free from clutter. Remove rugs or anything that might contribute to a fall.  Remove objects that might break and hurt the person.  Make sure lighting is good, both inside and outside.  Install grab rails as needed.  Use a monitoring device to alert you to falls or other needs for help.  Reduce confusion.  Keep  familiar objects and people around.  Use night lights or dim lights at night.  Label items or areas.  Use reminders, notes, or directions for daily activities or tasks.  Keep a simple, consistent routine for waking, meals, bathing, dressing, and bedtime.  Create a calm, quiet environment.  Place large clocks and calendars prominently.  Display emergency numbers and home address near all telephones.  Use cues to establish different times of the day. An example is to open curtains to let the natural light in during the day.   Use effective communication.  Choose simple words and short sentences.  Use a gentle, calm tone of voice.  Be careful not to interrupt.  If the person is struggling to find a word or communicate a thought, try to provide the word or thought.  Ask one question at a time. Allow the person ample time to answer questions. Repeat the question again if the person does not respond.  Reduce nighttime restlessness.  Provide a comfortable bed.  Have a consistent nighttime routine.  Ensure a regular walking or physical activity schedule. Involve the person in daily activities as much as possible.  Limit napping during the day.  Limit caffeine.  Attend social events that stimulate rather than overwhelm the senses.  Encourage good nutrition and hydration.  Reduce distractions during meal times and snacks.  Avoid foods that are too hot or too cold.  Monitor chewing and swallowing ability.  Continue with routine vision, hearing, dental, and medical screenings.  Only give over-the-counter or prescription medicines as directed by the caregiver.  Monitor driving abilities. Do not allow the person to drive when safe driving is no longer possible.  Register with an identification program which could provide location assistance in the event of a missing person situation. SEEK MEDICAL CARE IF:   New behavioral problems start such as moodiness,  aggressiveness, or seeing things that are not there (hallucinations).  Any new problem with brain function happens. This includes problems with balance, speech, or falling a lot.  Problems with swallowing develop.  Any symptoms of other illness happen. Small changes or worsening in any aspect of brain function can be a sign that the illness is getting worse. It can also be a sign of another medical illness such as infection. Seeing a caregiver right away is important. SEEK IMMEDIATE MEDICAL CARE IF:   A fever develops.  New or worsened confusion develops.  New or worsened sleepiness develops.  Staying awake becomes hard to do. Document Released: 03/14/2001 Document Revised: 12/11/2011 Document Reviewed: 02/13/2011 [facility] Patient Information 2014 [place], [place].

## 2013-04-18 LAB — BASIC METABOLIC PANEL
CO2: 30 mEq/L (ref 19–32)
Chloride: 100 mEq/L (ref 96–112)
Glucose, Bld: 104 mg/dL — ABNORMAL HIGH (ref 70–99)
Sodium: 136 mEq/L (ref 135–145)

## 2013-04-19 NOTE — Assessment & Plan Note (Signed)
Check labs Will con't prolia when Ins approval received

## 2013-04-19 NOTE — Progress Notes (Signed)
  Subjective:    Patient here for follow-up of elevated blood pressure.  She is not exercising and is adherent to a low-salt diet.  Blood pressure is well controlled at home. Cardiac symptoms: none. Patient denies: chest pain, chest pressure/discomfort, claudication, dyspnea, exertional chest pressure/discomfort, fatigue, irregular heart beat, lower extremity edema, near-syncope, orthopnea, palpitations, paroxysmal nocturnal dyspnea, syncope and tachypnea. Cardiovascular risk factors: advanced age (older than 28 for men, 76 for women), hypertension and sedentary lifestyle. Use of agents associated with hypertension: none. History of target organ damage: none.  The following portions of the patient's history were reviewed and updated as appropriate: allergies, current medications, past family history, past medical history, past social history, past surgical history and problem list.  Review of Systems Pertinent items are noted in HPI.     Objective:    BP 116/70  Pulse 92  Temp(Src) 97.6 F (36.4 C) (Oral)  Wt 105 lb 3.2 oz (47.718 kg)  BMI 18.05 kg/m2  SpO2 96% General appearance: alert, cooperative, appears stated age and no distress Neck: no adenopathy, no carotid bruit, no JVD, supple, symmetrical, trachea midline and thyroid not enlarged, symmetric, no tenderness/mass/nodules Lungs: clear to auscultation bilaterally Heart: S1, S2 normal Extremities: edema + 1 pitting edema low ext    Assessment:    Hypertension, normal blood pressure . Evidence of target organ damage: none.    Plan:    Medication: no change. Dietary sodium restriction. Regular aerobic exercise. Check blood pressures 2-3 times weekly and record. Follow up: 6 months and as needed.

## 2013-04-19 NOTE — Assessment & Plan Note (Signed)
Stable , con't meds  

## 2013-05-07 ENCOUNTER — Ambulatory Visit (INDEPENDENT_AMBULATORY_CARE_PROVIDER_SITE_OTHER): Payer: Medicare Other

## 2013-05-07 DIAGNOSIS — M810 Age-related osteoporosis without current pathological fracture: Secondary | ICD-10-CM

## 2013-05-07 MED ORDER — DENOSUMAB 60 MG/ML ~~LOC~~ SOLN: 60.0000 mg | Freq: Once | SUBCUTANEOUS | Status: DC

## 2013-05-07 MED ORDER — DENOSUMAB 60 MG/ML ~~LOC~~ SOLN
60.0000 mg | Freq: Once | SUBCUTANEOUS | Status: AC
  Administered 2013-05-07: 60 mg via SUBCUTANEOUS

## 2013-05-19 ENCOUNTER — Telehealth: Payer: Self-pay

## 2013-05-19 NOTE — Telephone Encounter (Signed)
Message left on triage voicemail: Tim from [facility] would like to know if we received information for Prior Authorization and get a status update on completion of PA.  I reviewed patient's chart and did not see any indication where a PA was requested/started. I will forward message to Aisha whom is handling PA/Refills

## 2013-05-19 NOTE — Telephone Encounter (Signed)
I have spoken with Arman Bogus about this issue and she has ask me to call [facility] and cancel this request for prolia.  I have called [facility] and cancelled refill request as per Arman Bogus.    Ag cma

## 2013-05-20 ENCOUNTER — Ambulatory Visit (INDEPENDENT_AMBULATORY_CARE_PROVIDER_SITE_OTHER): Payer: Medicare Other | Admitting: Family Medicine

## 2013-05-20 ENCOUNTER — Encounter (HOSPITAL_COMMUNITY): Payer: Self-pay | Admitting: Internal Medicine

## 2013-05-20 ENCOUNTER — Emergency Department (HOSPITAL_COMMUNITY): Payer: Medicare Other

## 2013-05-20 ENCOUNTER — Encounter: Payer: Self-pay | Admitting: Family Medicine

## 2013-05-20 ENCOUNTER — Inpatient Hospital Stay (HOSPITAL_COMMUNITY)
Admission: EM | Admit: 2013-05-20 | Discharge: 2013-05-29 | DRG: 286 | Disposition: A | Discharge status: Home Health | Payer: Medicare Other | Attending: Internal Medicine | Admitting: Internal Medicine

## 2013-05-20 VITALS — BP 114/60 | HR 104 | Temp 98.5°F

## 2013-05-20 DIAGNOSIS — I10 Essential (primary) hypertension: Secondary | ICD-10-CM | POA: Diagnosis present

## 2013-05-20 DIAGNOSIS — N318 Other neuromuscular dysfunction of bladder: Secondary | ICD-10-CM | POA: Diagnosis present

## 2013-05-20 DIAGNOSIS — M810 Age-related osteoporosis without current pathological fracture: Secondary | ICD-10-CM | POA: Diagnosis present

## 2013-05-20 DIAGNOSIS — J90 Pleural effusion, not elsewhere classified: Secondary | ICD-10-CM | POA: Diagnosis present

## 2013-05-20 DIAGNOSIS — I5043 Acute on chronic combined systolic (congestive) and diastolic (congestive) heart failure: Secondary | ICD-10-CM | POA: Diagnosis present

## 2013-05-20 DIAGNOSIS — I509 Heart failure, unspecified: Secondary | ICD-10-CM

## 2013-05-20 DIAGNOSIS — R32 Unspecified urinary incontinence: Secondary | ICD-10-CM | POA: Diagnosis present

## 2013-05-20 DIAGNOSIS — D649 Anemia, unspecified: Secondary | ICD-10-CM | POA: Diagnosis present

## 2013-05-20 DIAGNOSIS — I5042 Chronic combined systolic (congestive) and diastolic (congestive) heart failure: Secondary | ICD-10-CM

## 2013-05-20 DIAGNOSIS — R079 Chest pain, unspecified: Secondary | ICD-10-CM

## 2013-05-20 DIAGNOSIS — E871 Hypo-osmolality and hyponatremia: Secondary | ICD-10-CM | POA: Diagnosis present

## 2013-05-20 DIAGNOSIS — F0391 Unspecified dementia with behavioral disturbance: Secondary | ICD-10-CM | POA: Diagnosis present

## 2013-05-20 DIAGNOSIS — R0602 Shortness of breath: Secondary | ICD-10-CM

## 2013-05-20 DIAGNOSIS — E876 Hypokalemia: Secondary | ICD-10-CM | POA: Diagnosis present

## 2013-05-20 DIAGNOSIS — F03918 Unspecified dementia, unspecified severity, with other behavioral disturbance: Secondary | ICD-10-CM | POA: Diagnosis present

## 2013-05-20 DIAGNOSIS — R000 Tachycardia, unspecified: Secondary | ICD-10-CM

## 2013-05-20 DIAGNOSIS — Z9181 History of falling: Secondary | ICD-10-CM

## 2013-05-20 DIAGNOSIS — F0390 Unspecified dementia without behavioral disturbance: Secondary | ICD-10-CM

## 2013-05-20 DIAGNOSIS — R0789 Other chest pain: Secondary | ICD-10-CM

## 2013-05-20 DIAGNOSIS — I5023 Acute on chronic systolic (congestive) heart failure: Secondary | ICD-10-CM

## 2013-05-20 DIAGNOSIS — I959 Hypotension, unspecified: Secondary | ICD-10-CM | POA: Diagnosis not present

## 2013-05-20 DIAGNOSIS — J189 Pneumonia, unspecified organism: Secondary | ICD-10-CM | POA: Diagnosis present

## 2013-05-20 DIAGNOSIS — D72829 Elevated white blood cell count, unspecified: Secondary | ICD-10-CM | POA: Diagnosis present

## 2013-05-20 DIAGNOSIS — I4891 Unspecified atrial fibrillation: Secondary | ICD-10-CM

## 2013-05-20 DIAGNOSIS — I4821 Permanent atrial fibrillation: Secondary | ICD-10-CM | POA: Diagnosis present

## 2013-05-20 DIAGNOSIS — E46 Unspecified protein-calorie malnutrition: Secondary | ICD-10-CM | POA: Diagnosis present

## 2013-05-20 DIAGNOSIS — I428 Other cardiomyopathies: Secondary | ICD-10-CM | POA: Diagnosis present

## 2013-05-20 DIAGNOSIS — I2510 Atherosclerotic heart disease of native coronary artery without angina pectoris: Secondary | ICD-10-CM | POA: Diagnosis present

## 2013-05-20 HISTORY — DX: Unspecified dementia, unspecified severity, without behavioral disturbance, psychotic disturbance, mood disturbance, and anxiety: F03.90

## 2013-05-20 HISTORY — DX: Unspecified osteoarthritis, unspecified site: M19.90

## 2013-05-20 HISTORY — DX: Chronic combined systolic (congestive) and diastolic (congestive) heart failure: I50.42

## 2013-05-20 LAB — COMPREHENSIVE METABOLIC PANEL
ALT: 27 U/L (ref 0–35)
BUN: 20 mg/dL (ref 6–23)
CO2: 28 mEq/L (ref 19–32)
Calcium: 9.1 mg/dL (ref 8.4–10.5)
Creatinine, Ser: 0.55 mg/dL (ref 0.50–1.10)
GFR calc Af Amer: >90 mL/min (ref >90)
GFR calc non Af Amer: 84 mL/min — ABNORMAL LOW (ref >90)
Glucose, Bld: 105 mg/dL — ABNORMAL HIGH (ref 70–99)

## 2013-05-20 LAB — CBC
HCT: 36.1 % (ref 36.0–46.0)
HCT: 36.2 % (ref 36.0–46.0)
Hemoglobin: 12.7 g/dL (ref 12.0–15.0)
MCH: 30 pg (ref 26.0–34.0)
MCH: 29.6 pg (ref 26.0–34.0)
MCHC: 34.8 g/dL (ref 30.0–36.0)
MCV: 85.1 fL (ref 78.0–100.0)
RBC: 4.24 MIL/uL (ref 3.87–5.11)
RDW: 13.8 % (ref 11.5–15.5)

## 2013-05-20 LAB — CREATININE, SERUM: GFR calc non Af Amer: 85 mL/min — ABNORMAL LOW (ref >90)

## 2013-05-20 LAB — TSH: TSH: 1.013 u[IU]/mL (ref 0.350–4.500)

## 2013-05-20 LAB — PRO B NATRIURETIC PEPTIDE
Pro B Natriuretic peptide (BNP): 7162 pg/mL — ABNORMAL HIGH (ref 0–450)
Pro B Natriuretic peptide (BNP): 6451 pg/mL — ABNORMAL HIGH (ref 0–450)

## 2013-05-20 LAB — T4, FREE: Free T4: 2.04 ng/dL — ABNORMAL HIGH (ref 0.80–1.80)

## 2013-05-20 MED ORDER — ASPIRIN EC 81 MG PO TBEC
81.0000 mg | DELAYED_RELEASE_TABLET | Freq: Every day | ORAL | Status: DC
  Administered 2013-05-21 – 2013-05-29 (×9): 81 mg via ORAL
  Filled 2013-05-20 (×10): qty 1

## 2013-05-20 MED ORDER — METOLAZONE 5 MG PO TABS
5.0000 mg | ORAL_TABLET | Freq: Every day | ORAL | Status: DC
  Administered 2013-05-20 – 2013-05-22 (×3): 5 mg via ORAL
  Filled 2013-05-20 (×3): qty 1

## 2013-05-20 MED ORDER — ONDANSETRON HCL 4 MG/2ML IJ SOLN: 4.0000 mg | Freq: Four times a day (QID) | INTRAMUSCULAR | Status: DC | PRN

## 2013-05-20 MED ORDER — DILTIAZEM HCL 25 MG/5ML IV SOLN
5.0000 mg | Freq: Three times a day (TID) | INTRAVENOUS | Status: DC
  Administered 2013-05-20 – 2013-05-21 (×2): 5 mg via INTRAVENOUS
  Filled 2013-05-20 (×5): qty 5

## 2013-05-20 MED ORDER — DILTIAZEM HCL 25 MG/5ML IV SOLN
10.0000 mg | Freq: Once | INTRAVENOUS | Status: AC
  Administered 2013-05-20: 10 mg via INTRAVENOUS
  Filled 2013-05-20: qty 5

## 2013-05-20 MED ORDER — ACETAMINOPHEN 325 MG PO TABS
650.0000 mg | ORAL_TABLET | ORAL | Status: DC | PRN
  Administered 2013-05-22: 650 mg via ORAL
  Filled 2013-05-20: qty 2

## 2013-05-20 MED ORDER — SODIUM CHLORIDE 0.9 % IJ SOLN
3.0000 mL | Freq: Two times a day (BID) | INTRAMUSCULAR | Status: DC
  Administered 2013-05-20 – 2013-05-28 (×13): 3 mL via INTRAVENOUS

## 2013-05-20 MED ORDER — SODIUM CHLORIDE 0.9 % IJ SOLN: 3.0000 mL | INTRAMUSCULAR | Status: DC | PRN

## 2013-05-20 MED ORDER — FUROSEMIDE 10 MG/ML IJ SOLN
40.0000 mg | Freq: Four times a day (QID) | INTRAMUSCULAR | Status: DC
  Administered 2013-05-20 – 2013-05-21 (×3): 40 mg via INTRAVENOUS
  Filled 2013-05-20 (×6): qty 4

## 2013-05-20 MED ORDER — SODIUM CHLORIDE 0.9 % IV SOLN: 250.0000 mL | INTRAVENOUS | Status: DC | PRN

## 2013-05-20 MED ORDER — LEVOFLOXACIN IN D5W 750 MG/150ML IV SOLN
750.0000 mg | Freq: Once | INTRAVENOUS | Status: AC
  Administered 2013-05-20: 750 mg via INTRAVENOUS
  Filled 2013-05-20: qty 150

## 2013-05-20 MED ORDER — ENOXAPARIN SODIUM 40 MG/0.4ML ~~LOC~~ SOLN
40.0000 mg | SUBCUTANEOUS | Status: DC
  Administered 2013-05-20 – 2013-05-21 (×2): 40 mg via SUBCUTANEOUS
  Filled 2013-05-20 (×3): qty 0.4

## 2013-05-20 MED ORDER — POTASSIUM CHLORIDE CRYS ER 20 MEQ PO TBCR
20.0000 meq | EXTENDED_RELEASE_TABLET | Freq: Two times a day (BID) | ORAL | Status: DC
  Administered 2013-05-20 – 2013-05-21 (×2): 20 meq via ORAL
  Filled 2013-05-20 (×3): qty 1

## 2013-05-20 MED ORDER — LEVOFLOXACIN IN D5W 750 MG/150ML IV SOLN
750.0000 mg | INTRAVENOUS | Status: DC
  Administered 2013-05-22 – 2013-05-24 (×2): 750 mg via INTRAVENOUS
  Filled 2013-05-20 (×3): qty 150

## 2013-05-20 MED ORDER — LISINOPRIL 2.5 MG PO TABS
2.5000 mg | ORAL_TABLET | Freq: Every day | ORAL | Status: DC
  Filled 2013-05-20: qty 1

## 2013-05-20 MED ORDER — DONEPEZIL HCL 5 MG PO TABS
5.0000 mg | ORAL_TABLET | Freq: Every day | ORAL | Status: DC
  Administered 2013-05-20 – 2013-05-28 (×9): 5 mg via ORAL
  Filled 2013-05-20 (×12): qty 1

## 2013-05-20 MED ORDER — LEVOFLOXACIN IN D5W 750 MG/150ML IV SOLN: 750.0000 mg | Freq: Once | INTRAVENOUS | Status: DC

## 2013-05-20 MED ORDER — POLYETHYLENE GLYCOL 3350 17 GM/SCOOP PO POWD
17.0000 g | ORAL | Status: DC | PRN
  Administered 2013-05-20: 17 g via ORAL
  Filled 2013-05-20: qty 255

## 2013-05-20 NOTE — Progress Notes (Signed)
  Subjective:    [name] is a 79 y.o. female who presents for evaluation of chest pain. Onset was 3 days ago. No chest pain now.  Symptoms have worsened since that time. The patient is grabbing chest but when asked about chest pain she denies it.  She has been getting progressively sob and increased edema in low ext.  . Associated symptoms are: chest pressure/discomfort, dyspnea and lower extremity edema.  Alleviating factors are: none. Patient's cardiac risk factors are: advanced age (older than 56 for men, 58 for women), hypertension and sedentary lifestyle.  The following portions of the patient's history were reviewed and updated as appropriate:  She  has a past medical history of Hypertension; Osteoporosis; and Overactive bladder. She  does not have any pertinent problems on file. She  has past surgical history that includes Abdominal hysterectomy and Spine surgery (1980). Her family history includes Coronary artery disease in an other family member; Coronary artery disease (age of onset: 59) in her mother; Depression in an other family member; Heart attack in an other family member; Heart attack (age of onset: 28) in her son. She  reports that she has never smoked. She has never used smokeless tobacco. She reports that  drinks alcohol. She reports that she does not use illicit drugs. She has a current medication list which includes the following prescription(s): amlodipine, denosumab, donepezil, hydrochlorothiazide, and polyethylene glycol powder. Current Outpatient Prescriptions on File Prior to Visit  Medication Sig Dispense Refill  . amLODipine (NORVASC) 5 MG tablet Take 1 tablet (5 mg total) by mouth daily.  30 tablet  5  . denosumab (PROLIA) 60 MG/ML SOLN Inject 60 mg into the skin every 6 (six) months.       . donepezil (ARICEPT) 5 MG tablet Take 1 tablet (5 mg total) by mouth at bedtime as needed.  30 tablet  5  . hydrochlorothiazide (HYDRODIURIL) 25 MG tablet Take 1 tablet (25 mg  total) by mouth daily. 1 tab by mouth daily--office visit due now  30 tablet  5  . polyethylene glycol powder (MIRALAX) powder Take 17 g by mouth as directed.         No current facility-administered medications on file prior to visit.   She has No Known Allergies..  Review of Systems Pertinent items are noted in HPI.    Objective:    BP 114/60  Pulse 104  Temp(Src) 98.5 F (36.9 C) (Tympanic)  SpO2 96% General appearance: alert, cooperative, appears stated age and mild distress Lungs: diminished breath sounds RLL Heart: irregularly irregular rhythm Extremities: +2 pitting edema b/l   Cardiographics ECG: a fib  104 bpm  Imaging Chest x-ray: not done    Assessment:    Chest pain, suspected etiology: cardiac    Plan:    History and exam consistent with cardiac chest pain. Oxygen started. To emergency department via ambulance.  Pt given asa 81mg  x4 in office

## 2013-05-20 NOTE — ED Notes (Signed)
Pt is on the bed pan at this time trying to give urine sample

## 2013-05-20 NOTE — ED Notes (Signed)
According to EMS, patient has been having chest pain and sob since this weekend.  Family took her to [facility] and she has new onset Afib.  The patient denies N/ V, diaphoresis or any other symptoms.  The only reason she responded to [facility] was because her family asked her to go.  EMS was called from the clinic and was transported here to be evaluated.

## 2013-05-20 NOTE — H&P (Signed)
[facility] History and Physical  [name] ZOX:[id number] DOB: 05-15-1928 DOA: 05/20/2013  Referring physician:  PCP: [name], DO  Specialists:   Chief Complaint: A. fib with RVR, new onset CHF  HPI: [name] is a 79 y.o.WF PMHx hypertension, dementia, A. fib with RVR presents with her family members from her doctor's office after she was found to be in atrial fibrillation. The family reports that the patient has had increased shortness of breath and has been rubbing her chest and leaning over for the last several days. This is relatively persistent, she has associated swelling in her lower extremities but no coughs fevers nausea vomiting or seizure activity. On my exam the patient has an irregularly irregular rhythm, no obvious murmurs, no JVD but does have bilateral 2+ pitting edema which is symmetrical and goes to the knees. She has scattered quiet rales at the bases but no respiratory distress and is speaking in full sentences. Her EKG shows atrial fibrillation with a rapid ventricular rate around 120 beats per minute, nonspecific T-wave changes. I agree with the interpretation of the EKG is provided by the resident. I personally seen and interpreted this EKG. She will need lab work, chest x-ray and admission to the hospital. I suspect that she may have a mild amount of congestive heart failure contributing to her arrhythmia. She appears hemodynamically stable. Currently doesn't lady sitting up in the bed in no acute strep distress except when she is asked to move around. Children are present and state patient's weight in July was 106lbs, positive productive cough or disease clear), positive DOE unable to walk from [place] without respiratory distress which is new onset, negative/C./S., Positive questionable chest pain on exertion    Review of Systems: The patient denies anorexia, fever, weight loss,, vision loss, decreased hearing, hoarseness, chest pain, syncope,  hemoptysis,  abdominal pain, melena, hematochezia, severe indigestion/heartburn, hematuria, incontinence, genital sores, muscle weakness, suspicious skin lesions, transient blindness, difficulty walking, depression, abnormal bleeding, enlarged lymph nodes, angioedema, and breast masses.    Past Medical History  Diagnosis Date  . Hypertension   . Osteoporosis   . Overactive bladder   . Congestive heart failure, unspecified 05/20/2013   Past Surgical History  Procedure Laterality Date  . Abdominal hysterectomy      TAH BSO  . Spine surgery  1980    Lumbar surgery   Social History:  reports that she has never smoked. She has never used smokeless tobacco. She reports that  drinks alcohol. She reports that she does not use illicit drugs.No Known Allergies    Family History  Problem Relation Age of Onset  . Depression    . Heart attack    . Coronary artery disease    . Coronary artery disease Mother 54  . Heart attack Son 5      Prior to Admission medications   Medication Sig Start Date End Date Taking? Authorizing Provider  amLODipine (NORVASC) 5 MG tablet Take 1 tablet (5 mg total) by mouth daily. 04/17/13  Yes Yvonne R Lowne, DO  denosumab (PROLIA) 60 MG/ML SOLN Inject 60 mg into the skin every 6 (six) months.    Yes Historical Provider, MD  donepezil (ARICEPT) 5 MG tablet Take 1 tablet (5 mg total) by mouth at bedtime as needed. 04/17/13  Yes Yvonne R Lowne, DO  hydrochlorothiazide (HYDRODIURIL) 25 MG tablet Take 1 tablet (25 mg total) by mouth daily. 1 tab by mouth daily--office visit due now 04/17/13  Yes Lelon Perla,  DO  Multiple Vitamins-Minerals (CENTRUM SILVER ADULT 50+) TABS Take 1 tablet by mouth daily.   Yes Historical Provider, MD  naproxen sodium (ANAPROX) 220 MG tablet Take 220 mg by mouth daily.   Yes Historical Provider, MD  polyethylene glycol powder (MIRALAX) powder Take 17 g by mouth as needed (constipation).    Yes Historical Provider, MD   Physical Exam: Filed Vitals:    05/20/13 1307 05/20/13 1400 05/20/13 1630 05/20/13 1717  BP: 119/73 121/79 121/69 103/87  Pulse:  101 58 86  Temp: 97.7 F (36.5 C)     TempSrc: Oral     Resp: 24 29 25 20   Height:    5\' 3"  (1.6 m)  Weight:    57.471 kg (126 lb 11.2 oz)  SpO2: 95% 99% 93% 100%     General:  Alert,NAD  Eyes: Pupils equal round reactive to light and accommodation  Neck: Negative JVD  Cardiovascular: Irregular irregular rhythm, regular rate, negative murmurs rubs or gallops, DP/PT pulse 2+ bilateral  Respiratory: Clear to auscultation except; rales in the left lower lobe, diminished breath sounds in right lower lobe  Abdomen: Soft, nontender, nondistended, plus bowel sounds  Musculoskeletal: 2-3+ pitting edema bilaterally from feet to knees  Psychiatric: Patient easily confused however also easily reoriented  Neurologic: Cranial nerves II through XII intact  Labs on Admission:  Basic Metabolic Panel:  Recent Labs Lab 05/20/13 1438  NA 131*  K 3.6  CL 92*  CO2 28  GLUCOSE 105*  BUN 20  CREATININE 0.55  CALCIUM 9.1   Liver Function Tests:  Recent Labs Lab 05/20/13 1438  AST 24  ALT 27  ALKPHOS 59  BILITOT 0.4  PROT 5.9*  ALBUMIN 3.0*   No results found for this basename: LIPASE, AMYLASE,  in the last 168 hours No results found for this basename: AMMONIA,  in the last 168 hours CBC:  Recent Labs Lab 05/20/13 1438 05/20/13 1802  WBC 13.4* 12.7*  HGB 12.7 12.6  HCT 36.1 36.2  MCV 85.1 85.2  PLT 414* 399   Cardiac Enzymes: No results found for this basename: CKTOTAL, CKMB, CKMBINDEX, TROPONINI,  in the last 168 hours  BNP (last 3 results)  Recent Labs  05/20/13 1438  PROBNP 7162.0*   CBG: No results found for this basename: GLUCAP,  in the last 168 hours  Radiological Exams on Admission: Dg Chest Portable 1 View  05/20/2013   *RADIOLOGY REPORT*  Clinical Data: Chest pain and short of breath  PORTABLE CHEST - 1 VIEW  Comparison: 01/20/2013  Findings:  Bilateral pleural effusions have developed since the prior study.  There is a moderately large effusion on the right and a small effusion on the left.  There are multiple healing right rib fractures. Acute right rib fractures were noted on the prior study. There also are chronic right rib fractures.  No pneumothorax.  There is compressive atelectasis in the right lower lobe.  Negative for heart failure or edema.  IMPRESSION: Moderately large right pleural effusion with right lower lobe atelectasis.  Small left effusion.  Negative for heart failure.  Healing right rib fractures.   Original Report Authenticated By: Janeece Riggers, M.D.    EKG: No previous EKG for comparison atrial fibrillation, nonspecific diffuse ST-T wave depression. Cannot rule out ischemia  Assessment/Plan Principal Problem:   Congestive heart failure, unspecified Active Problems:   HYPERTENSION   Atrial fibrillation with RVR   Dementia with behavioral disturbance   Leukocytosis, unspecified   Pleural  effusion   Hyponatremia   1. CHF; new onset most likely brought on by A. fib with RVR start on Cardizem 5 mg 3 times a day; currently rate controlled --Pro -BNP= 6451 --Diuresis patient with 40 mg Lasix IV every 6 hours+ Zaroxolyn 5 mg daily --Strict I.'s and O.'s, daily a.m. weights  2. Atrial fibrillation with RVR; see #1 3. Leukocytosis; most likely reactionary to patient's bilateral pleural effusion however given her age we'll treat as CAP. Patient on Levaquin (pharmacy to handle)   4. Dementia; continue Aricept   5. Pleural effusion; bilateral pleural effusion Rt> Lt if does not resolve within the next 72 hours consider pleurocentesis  6. Hyponatremia; we'll currently monitor should correct as patient diuresis   CHF team called; did not receive a return call   Family Communication: Son and daughter present for discussion of plan of care  Time spent 60 minutes   Drema Dallas [facility] Pager  [phone]  If 7PM-7AM, please contact night-coverage [email or web address] Password [facility] 05/20/2013, 6:48 PM

## 2013-05-20 NOTE — Progress Notes (Signed)
ANTIBIOTIC CONSULT NOTE - INITIAL  Pharmacy Consult for Levaquin Indication: Possible CAP or UTI  No Known Allergies  Patient Measurements: Height: 5\' 3"  (160 cm) Weight: 126 lb 11.2 oz (57.471 kg) (Scale A) IBW/kg (Calculated) : 52.4 Adjusted Body Weight:   Vital Signs: Temp: 97.7 F (36.5 C) (08/19 1307) Temp src: Oral (08/19 1307) BP: 103/87 mmHg (08/19 1717) Pulse Rate: 86 (08/19 1717) Intake/Output from previous day:   Intake/Output from this shift:    Labs:  Recent Labs  05/20/13 1438 05/20/13 1802  WBC 13.4* 12.7*  HGB 12.7 12.6  PLT 414* 399  CREATININE 0.55 0.54   Estimated Creatinine Clearance: 44.1 ml/min (by C-G formula based on Cr of 0.54). No results found for this basename: VANCOTROUGH, VANCOPEAK, VANCORANDOM, GENTTROUGH, GENTPEAK, GENTRANDOM, TOBRATROUGH, TOBRAPEAK, TOBRARND, AMIKACINPEAK, AMIKACINTROU, AMIKACIN,  in the last 72 hours   Microbiology: No results found for this or any previous visit (from the past 720 hour(s)).  Medical History: Past Medical History  Diagnosis Date  . Hypertension   . Osteoporosis   . Overactive bladder   . Congestive heart failure, unspecified 05/20/2013    Medications:  Scheduled:  . aspirin EC  81 mg Oral Daily  . diltiazem  5 mg Intravenous TID PC  . donepezil  5 mg Oral QHS  . enoxaparin (LOVENOX) injection  40 mg Subcutaneous Q24H  . furosemide  40 mg Intravenous Q6H  . [START ON 05/22/2013] levofloxacin (LEVAQUIN) IV  750 mg Intravenous Q48H  . metolazone  5 mg Oral Daily  . potassium chloride  20 mEq Oral BID  . sodium chloride  3 mL Intravenous Q12H   Assessment: 79 yr old woman was sent to the hospital directly from her MD office visit. She was having chest pain and shortness of breath. She also had new onset a.fib. She is being treated with Levaquin for empiric coverage in case she has pneumonia or a UTI.  Goal of Therapy:    Plan:  CrCl 44 ml/min.   Pt received 750 mg this afternoon in the  ED. She will get future doses q48 hrs.   Eugene Garnet 05/20/2013,7:50 PM

## 2013-05-20 NOTE — ED Provider Notes (Signed)
79 year old female with a history of hypertension presents with her family members from her doctor's office after she was found to be in atrial fibrillation. The family reports that the patient has had increased shortness of breath and has been rubbing her chest and leaning over for the last several days. This is relatively persistent, she has associated swelling in her lower extremities but no coughs fevers nausea vomiting or seizure activity. On my exam the patient has an irregularly irregular rhythm, no obvious murmurs, no JVD but does have bilateral 2+ pitting edema which is symmetrical and goes to the knees. She has scattered quiet rales at the bases but no respiratory distress and is speaking in full sentences.  Her EKG shows atrial fibrillation with a rapid ventricular rate around 120 beats per minute, nonspecific T-wave changes. I agree with the interpretation of the EKG is provided by the resident. I personally seen and interpreted this EKG. She will need lab work, chest x-ray and admission to the hospital. I suspect that she may have a mild amount of congestive heart failure contributing to her arrhythmia. She appears hemodynamically stable.  Disposition: Admit  Diagnosis:  Afib with RVR  Medical screening examination/treatment/procedure(s) were conducted as a shared visit with non-physician practitioner(s) and myself.  I personally evaluated the patient during the encounter.    Vida Roller, MD 05/21/13 [phone]

## 2013-05-20 NOTE — ED Notes (Signed)
Pt unable to void at this time pt taken off of bed pan

## 2013-05-20 NOTE — ED Provider Notes (Signed)
CSN: [id number]     Arrival date & time 05/20/13  1253 History     First MD Initiated Contact with Patient 05/20/13 1302     Chief Complaint  Patient presents with  . Chest Pain  . Shortness of Breath   (Consider location/radiation/quality/duration/timing/severity/associated sxs/prior Treatment) HPI [name] is a 79 year old female with a PMH of dementia, HTN, who presents to [facility] after being sent by EMS from her PCP's office this afternoon.  Patient's son and daughter are at bedside and aid in providing history.  Patient's daughter brought her to her PCP today for evaluation of chest pain that started 3 days ago.  Patient's daughter noticed that her mother would become SOB with minimal exertion and require to sit back down.  She reports noticing her mother grabbing her chest while sitting.  She reports that occasionally her mother would admit to chest pain and at other times she would deny it but since it was new she wanted to bring her mother in for evaluation.  They also report increased lower extremity edema, and increased gait instability.  Level 5 caveat applies.  Past Medical History  Diagnosis Date  . Hypertension   . Osteoporosis   . Overactive bladder    Past Surgical History  Procedure Laterality Date  . Abdominal hysterectomy      TAH BSO  . Spine surgery  1980    Lumbar surgery   Family History  Problem Relation Age of Onset  . Depression    . Heart attack    . Coronary artery disease    . Coronary artery disease Mother 60  . Heart attack Son 86   History  Substance Use Topics  . Smoking status: Never Smoker   . Smokeless tobacco: Never Used  . Alcohol Use: Yes   OB History   Grav Para Term Preterm Abortions TAB SAB Ect Mult Living                ROS provided by Daughter Review of Systems  Unable to perform ROS: Dementia  Constitutional: Positive for activity change (decreased). Negative for appetite change.  Respiratory: Positive for shortness of  breath.   Cardiovascular: Positive for chest pain and leg swelling.  Gastrointestinal: Negative for abdominal pain.  Level 5 caveat applies  Allergies  Review of patient's allergies indicates no known allergies.  Home Medications   Current Outpatient Rx  Name  Route  Sig  Dispense  Refill  . amLODipine (NORVASC) 5 MG tablet   Oral   Take 1 tablet (5 mg total) by mouth daily.   30 tablet   5   . denosumab (PROLIA) 60 MG/ML SOLN   Subcutaneous   Inject 60 mg into the skin every 6 (six) months.          . donepezil (ARICEPT) 5 MG tablet   Oral   Take 1 tablet (5 mg total) by mouth at bedtime as needed.   30 tablet   5   . hydrochlorothiazide (HYDRODIURIL) 25 MG tablet   Oral   Take 1 tablet (25 mg total) by mouth daily. 1 tab by mouth daily--office visit due now   30 tablet   5   . polyethylene glycol powder (MIRALAX) powder   Oral   Take 17 g by mouth as directed.            BP 119/73  Temp(Src) 97.7 F (36.5 C) (Oral)  Resp 24  SpO2 95% Physical Exam  Nursing note and vitals reviewed. Constitutional: She appears well-developed. No distress.  HENT:  Head: Normocephalic and atraumatic.  Eyes: Conjunctivae and EOM are normal. Pupils are equal, round, and reactive to light.  Neck: Neck supple.  Cardiovascular: Normal heart sounds and intact distal pulses.   No murmur heard. Irregularly Irregular   Pulmonary/Chest: Effort normal and breath sounds normal. No respiratory distress. She has no wheezes. She has no rales. She exhibits no tenderness.  bibasilar crackles  Abdominal: Soft. Bowel sounds are normal. She exhibits no distension. There is no tenderness.  Musculoskeletal: She exhibits edema (2+ pitting edema to mid calf, B/L).  Neurological: She is alert.  Oriented to person, place, situation, birthdate, but not time.  Skin: She is not diaphoretic.    ED Course   Procedures (including critical care time)  Date: 05/20/2013  Rate: 119  Rhythm: atrial  fibrillation  QRS Axis: normal  Intervals: normal  ST/T Wave abnormalities: nonspecific ST/T changes  Conduction Disutrbances:none  Narrative Interpretation: A fib with RVR, nonspecific ST/T changes no LVH appreciated.  Old EKG Reviewed: none available   Labs Reviewed  CBC - Abnormal; Notable for the following:    WBC 13.4 (*)    Platelets 414 (*)    All other components within normal limits  COMPREHENSIVE METABOLIC PANEL - Abnormal; Notable for the following:    Sodium 131 (*)    Chloride 92 (*)    Glucose, Bld 105 (*)    Total Protein 5.9 (*)    Albumin 3.0 (*)    GFR calc non Af Amer 84 (*)    All other components within normal limits  PRO B NATRIURETIC PEPTIDE - Abnormal; Notable for the following:    Pro B Natriuretic peptide (BNP) 7162.0 (*)    All other components within normal limits  TSH  T4, FREE  URINALYSIS, ROUTINE W REFLEX MICROSCOPIC  POCT I-STAT TROPONIN I   Dg Chest Portable 1 View  05/20/2013   *RADIOLOGY REPORT*  Clinical Data: Chest pain and short of breath  PORTABLE CHEST - 1 VIEW  Comparison: 01/20/2013  Findings: Bilateral pleural effusions have developed since the prior study.  There is a moderately large effusion on the right and a small effusion on the left.  There are multiple healing right rib fractures. Acute right rib fractures were noted on the prior study. There also are chronic right rib fractures.  No pneumothorax.  There is compressive atelectasis in the right lower lobe.  Negative for heart failure or edema.  IMPRESSION: Moderately large right pleural effusion with right lower lobe atelectasis.  Small left effusion.  Negative for heart failure.  Healing right rib fractures.   Original Report Authenticated By: Janeece Riggers, M.D.   1. CHF (congestive heart failure)   2. Chest pain   3. Dementia     MDM  79 year old female with PMH of Dementia, HTN, Osteoporosis, who presents to the ED by EMS from physicians office for evaluation of chest pain,  new onset A. Fib. And lower extremity edema.  Patient has been given 324mg  of ASA at physicians office and denies current chest pain.  Her rate is currently 90-120.  On exam her rhythm is irregularly irregular, A fib is confirmed on EKG. Will obtain I-stat troponins as reported symptoms are concerning for typical chest pain.  Will obtain portable CXR to evaluate for infiltrate, CHF, cardiomegaly.  Obtain CBC, CMP, and ProBNP. After 10mg  cardizem patient has HR 85-95. CXR shows moderate right pleural  effusion, small left effusion, healing right rib fractures. Pro BNP is elevated 7162 CBC shows mild leukocytosis of 13.4 CMP shows hyponatremia of 131 Will draw BCx and UCx then start Levaquin IV for empiric coverage for CAP and UTI. Consult placed to [facility] for admission. Spoke with Dr. Carolyne Littles will admit.     Carlynn Purl, DO 05/20/13 1644

## 2013-05-21 ENCOUNTER — Encounter (HOSPITAL_COMMUNITY): Payer: Self-pay | Admitting: General Practice

## 2013-05-21 ENCOUNTER — Inpatient Hospital Stay (HOSPITAL_COMMUNITY): Payer: Medicare Other

## 2013-05-21 DIAGNOSIS — I059 Rheumatic mitral valve disease, unspecified: Secondary | ICD-10-CM

## 2013-05-21 DIAGNOSIS — E871 Hypo-osmolality and hyponatremia: Secondary | ICD-10-CM

## 2013-05-21 DIAGNOSIS — I4891 Unspecified atrial fibrillation: Secondary | ICD-10-CM

## 2013-05-21 DIAGNOSIS — R079 Chest pain, unspecified: Secondary | ICD-10-CM

## 2013-05-21 DIAGNOSIS — I509 Heart failure, unspecified: Secondary | ICD-10-CM

## 2013-05-21 LAB — URINALYSIS W MICROSCOPIC + REFLEX CULTURE
Ketones, ur: NEGATIVE mg/dL
Nitrite: NEGATIVE
Specific Gravity, Urine: 1.009 (ref 1.005–1.030)
pH: 7 (ref 5.0–8.0)

## 2013-05-21 LAB — COMPREHENSIVE METABOLIC PANEL
BUN: 19 mg/dL (ref 6–23)
CO2: 32 mEq/L (ref 19–32)
Chloride: 89 mEq/L — ABNORMAL LOW (ref 96–112)
Creatinine, Ser: 0.72 mg/dL (ref 0.50–1.10)
GFR calc non Af Amer: 77 mL/min — ABNORMAL LOW (ref >90)
Glucose, Bld: 95 mg/dL (ref 70–99)
Total Bilirubin: 0.6 mg/dL (ref 0.3–1.2)

## 2013-05-21 LAB — URINALYSIS, ROUTINE W REFLEX MICROSCOPIC
Leukocytes, UA: NEGATIVE
Protein, ur: NEGATIVE mg/dL
Urobilinogen, UA: 0.2 mg/dL (ref 0.0–1.0)

## 2013-05-21 LAB — CBC WITH DIFFERENTIAL/PLATELET
Basophils Absolute: 0.1 10*3/uL (ref 0.0–0.1)
Eosinophils Absolute: 0.5 10*3/uL (ref 0.0–0.7)
Lymphocytes Relative: 9 % — ABNORMAL LOW (ref 12–46)
Lymphs Abs: 1.1 10*3/uL (ref 0.7–4.0)
Neutrophils Relative %: 75 % (ref 43–77)
Platelets: 359 10*3/uL (ref 150–400)
RBC: 4.01 MIL/uL (ref 3.87–5.11)
WBC: 11.6 10*3/uL — ABNORMAL HIGH (ref 4.0–10.5)

## 2013-05-21 LAB — OSMOLALITY, URINE: Osmolality, Ur: 325 mOsm/kg — ABNORMAL LOW (ref 390–1090)

## 2013-05-21 LAB — MAGNESIUM: Magnesium: 1.8 mg/dL (ref 1.5–2.5)

## 2013-05-21 LAB — TROPONIN I
Troponin I: <0.3 ng/mL (ref <0.30)
Troponin I: <0.3 ng/mL (ref <0.30)

## 2013-05-21 MED ORDER — POTASSIUM CHLORIDE CRYS ER 20 MEQ PO TBCR
40.0000 meq | EXTENDED_RELEASE_TABLET | Freq: Once | ORAL | Status: AC
  Administered 2013-05-21: 40 meq via ORAL

## 2013-05-21 MED ORDER — POTASSIUM CHLORIDE 10 MEQ/100ML IV SOLN
10.0000 meq | INTRAVENOUS | Status: DC
  Administered 2013-05-21: 10 meq via INTRAVENOUS
  Filled 2013-05-21 (×4): qty 100

## 2013-05-21 MED ORDER — POTASSIUM CHLORIDE CRYS ER 20 MEQ PO TBCR: 20.0000 meq | EXTENDED_RELEASE_TABLET | Freq: Three times a day (TID) | ORAL | Status: DC

## 2013-05-21 MED ORDER — ALBUTEROL SULFATE (5 MG/ML) 0.5% IN NEBU: 2.5000 mg | INHALATION_SOLUTION | RESPIRATORY_TRACT | Status: DC | PRN

## 2013-05-21 MED ORDER — CARVEDILOL 3.125 MG PO TABS
3.1250 mg | ORAL_TABLET | Freq: Two times a day (BID) | ORAL | Status: DC
  Administered 2013-05-21 – 2013-05-27 (×9): 3.125 mg via ORAL
  Filled 2013-05-21 (×16): qty 1

## 2013-05-21 MED ORDER — DILTIAZEM HCL 25 MG/5ML IV SOLN
5.0000 mg | Freq: Four times a day (QID) | INTRAVENOUS | Status: DC | PRN
  Filled 2013-05-21: qty 5

## 2013-05-21 MED ORDER — POTASSIUM CHLORIDE CRYS ER 20 MEQ PO TBCR: 40.0000 meq | EXTENDED_RELEASE_TABLET | Freq: Two times a day (BID) | ORAL | Status: DC

## 2013-05-21 MED ORDER — POTASSIUM CHLORIDE CRYS ER 20 MEQ PO TBCR
EXTENDED_RELEASE_TABLET | ORAL | Status: AC
  Filled 2013-05-21: qty 2

## 2013-05-21 MED ORDER — FUROSEMIDE 10 MG/ML IJ SOLN
40.0000 mg | Freq: Two times a day (BID) | INTRAMUSCULAR | Status: DC
  Administered 2013-05-21 – 2013-05-22 (×2): 40 mg via INTRAVENOUS
  Filled 2013-05-21 (×2): qty 4

## 2013-05-21 MED ORDER — ALBUTEROL SULFATE (5 MG/ML) 0.5% IN NEBU
2.5000 mg | INHALATION_SOLUTION | Freq: Four times a day (QID) | RESPIRATORY_TRACT | Status: DC | PRN
  Administered 2013-05-21: 2.5 mg via RESPIRATORY_TRACT
  Filled 2013-05-21: qty 0.5

## 2013-05-21 MED ORDER — POTASSIUM CHLORIDE CRYS ER 20 MEQ PO TBCR
40.0000 meq | EXTENDED_RELEASE_TABLET | Freq: Three times a day (TID) | ORAL | Status: AC
  Administered 2013-05-21 (×3): 40 meq via ORAL
  Filled 2013-05-21: qty 1
  Filled 2013-05-21 (×2): qty 2

## 2013-05-21 NOTE — Progress Notes (Signed)
Utilization review completed.  P.J. Sequita Wise,RN,BSN Case Manager [phone]  

## 2013-05-21 NOTE — Progress Notes (Addendum)
05/20/2013 Pt.A/Ox2 and is forgetful at times. She is on 2 L [place] of oxygen. She is incontinent of urine and stool and urinates frequently. She had no c/o pain during the shift. 2345 Mid Level provider on call for [facility] was paged and notified that pt. HR was increasing to 140's-150's nonsustained. Pt.is asymptomatic. Patient's HR ranges from 110's-120's at baseline with HR being as low as 95 bpm. Was told that if patient's HR is sustaining in the 120's or more and/or if patient is symptomatic to notify again. Pt.is already receiving scheduled Cardizem injections. Patient's heart rate has not sustained in the 120's or more and she has had no symptoms or signs of distress. She has spent the majority of the shift awake and has pulled off her gown and telemetry monitor leads at times.

## 2013-05-21 NOTE — Progress Notes (Signed)
Patient's K+ is 2.8 MD notified and orders given; will continue to monitor patient. Lorretta Harp RN

## 2013-05-21 NOTE — Progress Notes (Signed)
Cosigned for Teal Curry's assessment, I/O, notes, care plan/education etc. D. Olinda Nola RN 

## 2013-05-21 NOTE — Progress Notes (Signed)
Patient is unable to tolerate IV K+, MD notified and new orders given; will continue to monitor patient. Lorretta Harp RN

## 2013-05-21 NOTE — Progress Notes (Signed)
Echocardiogram 2D Echocardiogram has been performed.  [name] 05/21/2013, 3:46 PM

## 2013-05-21 NOTE — Progress Notes (Addendum)
[facility]                                                                                Patient Demographics  [name], is a 79 y.o. female, DOB - 1927-12-29, ZOX:[id number], WJX:[id number]  Admit date - 05/20/2013  Admitting Physician Drema Dallas, MD  Outpatient Primary MD for the patient is [name], DO  LOS - 1   Chief Complaint  Patient presents with  . Chest Pain  . Shortness of Breath        Assessment & Plan    1.SOB - due to comminution of acute on chronic CHF nonspecific echo pending along with committee acquired pneumonia.- Continue gentle diuresis, along with empiric antibiotic, follow cultures, oxygen nebulizer treatments as needed, she is symptom free and is not short of breath now, cardiology following the patient. We'll place her on Coreg scheduled along with Lasix for now.   2. A. fib with RVR she is currently in sinus rhythm, beta blocker started. Echogram and TSH are pending, she does have poor balance, cardiology is following, we'll defer long-term and aggregation to cardiology. Question if she might require Eliquis.   3. CAP -  causing leukocytosis. She is on Levaquin and monitor clinically, check UA.   4. Bilateral pleural effusions due to CHF. Continue diuresis and monitor clinically.     5.Dementia; continue Aricept     6. Hyponatremia -  in the setting of fluid overload continue diuresis, will check urine sodium osmolality and serum osmolality. Free water restriction.    7. Low potassium aggressively replaced orally, could not tolerate IV, repeat BMP in the morning with magnesium      Code Status: Full  Family Communication: Daughter  Disposition Plan: Home   Procedures Echo   Consults  Cards   DVT Prophylaxis  Lovenox    Lab Results  Component Value Date   PLT 359 05/21/2013    Medications  Scheduled Meds: . aspirin EC  81 mg Oral Daily  . diltiazem  5 mg Intravenous TID PC  . donepezil  5 mg Oral  QHS  . enoxaparin (LOVENOX) injection  40 mg Subcutaneous Q24H  . furosemide  40 mg Intravenous Q12H  . [START ON 05/22/2013] levofloxacin (LEVAQUIN) IV  750 mg Intravenous Q48H  . metolazone  5 mg Oral Daily  . potassium chloride SA      . potassium chloride  40 mEq Oral TID  . sodium chloride  3 mL Intravenous Q12H   Continuous Infusions:  PRN Meds:.sodium chloride, acetaminophen, ondansetron (ZOFRAN) IV, polyethylene glycol powder, sodium chloride  Antibiotics     Anti-infectives   Start     Dose/Rate Route Frequency Ordered Stop   05/22/13 1000  levofloxacin (LEVAQUIN) IVPB 750 mg     750 mg 100 mL/hr over 90 Minutes Intravenous Every 48 hours 05/20/13 1924     05/20/13 1600  levofloxacin (LEVAQUIN) IVPB 750 mg  Status:  Discontinued     750 mg 100 mL/hr over 90 Minutes Intravenous  Once 05/20/13 1549 05/20/13 1555   05/20/13 1600  levofloxacin (LEVAQUIN) IVPB 750 mg    Comments:  Please start after cultures drawn  750 mg 100 mL/hr over 90 Minutes Intravenous  Once 05/20/13 1555 05/20/13 1801       Time Spent in minutes   35   SINGH,PRASHANT K M.D on 05/21/2013 at 11:44 AM  Between 7am to 7pm - Pager - [phone]  After 7pm go to [email or web address] - password [email or web address]  And look for the night coverage person covering for me after hours  [facility] Office  [phone]    Subjective:   [name] today has, No headache, No chest pain, No abdominal pain - No Nausea, No new weakness tingling or numbness, No Cough - SOB.   Objective:   Filed Vitals:   05/20/13 1717 05/20/13 2008 05/21/13 0430 05/21/13 0949  BP: 103/87 108/49 121/99 116/58  Pulse: 86 113 120 84  Temp:  97.4 F (36.3 C) 97.3 F (36.3 C) 97.7 F (36.5 C)  TempSrc:  Oral Oral Oral  Resp: 20 20 19 18   Height: 5\' 3"  (1.6 m)     Weight: 57.471 kg (126 lb 11.2 oz)     SpO2: 100% 97% 100% 98%    Wt Readings from Last 3 Encounters:  05/20/13 57.471 kg (126 lb 11.2 oz)  04/17/13 47.718 kg  (105 lb 3.2 oz)  01/20/13 54.432 kg (120 lb)     Intake/Output Summary (Last 24 hours) at 05/21/13 1144 Last data filed at 05/21/13 0831  Gross per 24 hour  Intake      3 ml  Output    250 ml  Net   -247 ml    Exam Awake Alert, Oriented X 2, No new F.N deficits, Normal affect, she is quite hard of hearing [place].AT,PERRAL Supple Neck,No JVD, No cervical lymphadenopathy appriciated.  Symmetrical Chest wall movement, Good air movement bilaterally, few rales RRR,No Gallops,Rubs or new Murmurs, No Parasternal Heave +ve B.Sounds, Abd Soft, Non tender, No organomegaly appriciated, No rebound - guarding or rigidity. No Cyanosis, Clubbing or edema, No new Rash or bruise     Data Review   Micro Results No results found for this or any previous visit (from the past 240 hour(s)).  Radiology Reports Dg Chest Port 1 View  05/21/2013   *RADIOLOGY REPORT*  Clinical Data: Congestive heart failure  PORTABLE CHEST - 1 VIEW  Comparison: 05/20/2013  Findings: Cardiac enlargement.  Pulmonary vascular congestion with mild edema.  Progression of right pleural effusion.  Small left effusion unchanged.  Bibasilar atelectasis has progressed. Subacute and chronic right rib fractures noted.  IMPRESSION: Progression of congestive heart failure.  Progression of right pleural effusion.   Original Report Authenticated By: Janeece Riggers, M.D.   Dg Chest Portable 1 View  05/20/2013   *RADIOLOGY REPORT*  Clinical Data: Chest pain and short of breath  PORTABLE CHEST - 1 VIEW  Comparison: 01/20/2013  Findings: Bilateral pleural effusions have developed since the prior study.  There is a moderately large effusion on the right and a small effusion on the left.  There are multiple healing right rib fractures. Acute right rib fractures were noted on the prior study. There also are chronic right rib fractures.  No pneumothorax.  There is compressive atelectasis in the right lower lobe.  Negative for heart failure or edema.   IMPRESSION: Moderately large right pleural effusion with right lower lobe atelectasis.  Small left effusion.  Negative for heart failure.  Healing right rib fractures.   Original Report Authenticated By: Janeece Riggers, M.D.    CBC  Recent Labs Lab 05/20/13 1438 05/20/13 1802 05/21/13  0615  WBC 13.4* 12.7* 11.6*  HGB 12.7 12.6 11.7*  HCT 36.1 36.2 33.8*  PLT 414* 399 359  MCV 85.1 85.2 84.3  MCH 30.0 29.6 29.2  MCHC 35.2 34.8 34.6  RDW 13.9 13.8 13.5  LYMPHSABS  --   --  1.1  MONOABS  --   --  1.4*  EOSABS  --   --  0.5  BASOSABS  --   --  0.1    Chemistries   Recent Labs Lab 05/20/13 1438 05/20/13 1802 05/21/13 0615  NA 131*  --  131*  K 3.6  --  2.8*  CL 92*  --  89*  CO2 28  --  32  GLUCOSE 105*  --  95  BUN 20  --  19  CREATININE 0.55 0.54 0.72  CALCIUM 9.1  --  8.7  MG  --   --  1.8  AST 24  --  21  ALT 27  --  23  ALKPHOS 59  --  54  BILITOT 0.4  --  0.6   ------------------------------------------------------------------------------------------------------------------ estimated creatinine clearance is 44.1 ml/min (by C-G formula based on Cr of 0.72). ------------------------------------------------------------------------------------------------------------------ No results found for this basename: HGBA1C,  in the last 72 hours ------------------------------------------------------------------------------------------------------------------ No results found for this basename: CHOL, HDL, LDLCALC, TRIG, CHOLHDL, LDLDIRECT,  in the last 72 hours ------------------------------------------------------------------------------------------------------------------  Recent Labs  05/20/13 1438  TSH 1.013   ------------------------------------------------------------------------------------------------------------------ No results found for this basename: VITAMINB12, FOLATE, FERRITIN, TIBC, IRON, RETICCTPCT,  in the last 72 hours  Coagulation profile No results  found for this basename: INR, PROTIME,  in the last 168 hours  No results found for this basename: DDIMER,  in the last 72 hours  Cardiac Enzymes  Recent Labs Lab 05/20/13 1804 05/20/13 2325 05/21/13 0615  TROPONINI <0.30 <0.30 <0.30   ------------------------------------------------------------------------------------------------------------------ No components found with this basename: POCBNP,

## 2013-05-21 NOTE — Consult Note (Signed)
Patient ID: [name] MRN: [id number] DOB/AGE: 77/18/1929 77 y.o.  Admit date: 05/20/2013 Referring Physician: Thedore Mins Primary Physician: Laury Axon Primary Cardiologist: New Reason for Consultation: Atrial fibrillation, CHF  HPI: 79 yo female with history of HTN, dementia who is admitted to the Hospitalist service with volume overload c/w CHF, atrial fibrillation with RVR. She has had SOB for several days. Was found to be in atrial fibrillation yesterday when presenting to primary care office. Admitted for further management. She also has noticed LE edema. She has a 20 lb weight gain over last month. No chest pain, near syncope or syncope. Her family is present and assists in providing the history. Chest x-ray with evidence of CHF. EKG without ischemic changes. Troponin negative.   Past Medical History  Diagnosis Date  . Hypertension   . Osteoporosis   . Overactive bladder   . Congestive heart failure, unspecified 05/20/2013    Family History  Problem Relation Age of Onset  . Depression    . Heart attack    . Coronary artery disease    . Coronary artery disease Mother 94  . Heart attack Son 53    History   Social History  . Marital Status: Married    Spouse Name: N/A    Number of Children: N/A  . Years of Education: N/A   Occupational History  . retired     Engineer, civil (consulting)  . volunteers at hospice    Social History Main Topics  . Smoking status: Never Smoker   . Smokeless tobacco: Never Used  . Alcohol Use: Yes  . Drug Use: No  . Sexual Activity: No   Other Topics Concern  . Not on file   Social History Narrative  . No narrative on file    Past Surgical History  Procedure Laterality Date  . Abdominal hysterectomy      TAH BSO  . Spine surgery  1980    Lumbar surgery    No Known Allergies  Prescriptions prior to admission  Medication Sig Dispense Refill  . amLODipine (NORVASC) 5 MG tablet Take 1 tablet (5 mg total) by mouth daily.  30 tablet  5  . denosumab (PROLIA)  60 MG/ML SOLN Inject 60 mg into the skin every 6 (six) months.       . donepezil (ARICEPT) 5 MG tablet Take 1 tablet (5 mg total) by mouth at bedtime as needed.  30 tablet  5  . hydrochlorothiazide (HYDRODIURIL) 25 MG tablet Take 1 tablet (25 mg total) by mouth daily. 1 tab by mouth daily--office visit due now  30 tablet  5  . Multiple Vitamins-Minerals (CENTRUM SILVER ADULT 50+) TABS Take 1 tablet by mouth daily.      . naproxen sodium (ANAPROX) 220 MG tablet Take 220 mg by mouth daily.      . polyethylene glycol powder (MIRALAX) powder Take 17 g by mouth as needed (constipation).        Hospital Medications:  . aspirin EC  81 mg Oral Daily  . diltiazem  5 mg Intravenous TID PC  . donepezil  5 mg Oral QHS  . enoxaparin (LOVENOX) injection  40 mg Subcutaneous Q24H  . furosemide  40 mg Intravenous Q6H  . [START ON 05/22/2013] levofloxacin (LEVAQUIN) IV  750 mg Intravenous Q48H  . metolazone  5 mg Oral Daily  . potassium chloride  10 mEq Intravenous Q1 Hr x 4  . potassium chloride SA      . potassium chloride  20 mEq  Oral BID  . sodium chloride  3 mL Intravenous Q12H   Review of systems complete and found to be negative unless listed above   Physical Exam: Blood pressure 116/58, pulse 84, temperature 97.7 F (36.5 C), temperature source Oral, resp. rate 18, height 5\' 3"  (1.6 m), weight 126 lb 11.2 oz (57.471 kg), SpO2 98.00%.   General: Well developed, well nourished, NAD  HEENT: OP clear, mucus membranes moist  SKIN: warm, dry. No rashes.  Neuro: No focal deficits  Musculoskeletal: Muscle strength 5/5 all ext  Psychiatric: Mood and affect normal  Neck: No JVD, no carotid bruits, no thyromegaly, no lymphadenopathy.  Lungs:Clear bilaterally, no wheezes, rhonci, crackles  Cardiovascular: Irregular irregular No murmurs, gallops or rubs.  Abdomen:Soft. Bowel sounds present. Non-tender.  Extremities: 2 + bilateral lower extremity edema.    Labs:   Lab Results  Component Value Date     WBC 11.6* 05/21/2013   HGB 11.7* 05/21/2013   HCT 33.8* 05/21/2013   MCV 84.3 05/21/2013   PLT 359 05/21/2013    Recent Labs Lab 05/21/13 0615  NA 131*  K 2.8*  CL 89*  CO2 32  BUN 19  CREATININE 0.72  CALCIUM 8.7  PROT 5.5*  BILITOT 0.6  ALKPHOS 54  ALT 23  AST 21  GLUCOSE 95   Lab Results  Component Value Date   TROPONINI <0.30 05/21/2013     Radiology:  Chest x-ray 05/21/13: Findings: Cardiac enlargement. Pulmonary vascular congestion with  mild edema. Progression of right pleural effusion. Small left  effusion unchanged.  Bibasilar atelectasis has progressed. Subacute and chronic right  rib fractures noted.  IMPRESSION:  Progression of congestive heart failure. Progression of right  pleural effusion.  EKG: Atrial fibrillation, PVC  ASSESSMENT AND PLAN:   1. Acute CHF: ? Etiology. May be related to her rapid ventricular rate with atrial fibrillation. Agree with plans for echo today to assess LVEF and exclude valvular disease, structural heart disease. Continue diuresis with IV lasix. Follow renal function and sodium level closely. Further recs after echo is completed.   2. Atrial fibrillation: She is now in sinus. Would change to po Cardizem today. Long discussion regarding long term anti-coagulation. She is clearly at risk for CVA with PAF given advanced age. Will be able to discuss further after echo available. I have discussed with Dr. Thedore Mins. She is on DVT prophylaxis dose Lovenox now. Will likely use Eliquis before discharge.   3. Hyponatremia: Follow closely with diuresis.   Signed: Lequan Dobratz 05/21/2013, 10:45 AM

## 2013-05-22 ENCOUNTER — Encounter (HOSPITAL_COMMUNITY)
Admission: EM | Disposition: A | Discharge status: Home Health | Payer: Self-pay | Source: Home / Self Care | Attending: Internal Medicine

## 2013-05-22 ENCOUNTER — Encounter (HOSPITAL_COMMUNITY): Payer: Self-pay | Admitting: Physician Assistant

## 2013-05-22 DIAGNOSIS — I2510 Atherosclerotic heart disease of native coronary artery without angina pectoris: Secondary | ICD-10-CM

## 2013-05-22 HISTORY — PX: LEFT HEART CATHETERIZATION WITH CORONARY ANGIOGRAM: SHX5451

## 2013-05-22 LAB — BASIC METABOLIC PANEL
BUN: 21 mg/dL (ref 6–23)
Creatinine, Ser: 0.77 mg/dL (ref 0.50–1.10)
GFR calc non Af Amer: 76 mL/min — ABNORMAL LOW (ref >90)
Glucose, Bld: 99 mg/dL (ref 70–99)
Potassium: 3.7 mEq/L (ref 3.5–5.1)

## 2013-05-22 LAB — URINE CULTURE: Culture: NO GROWTH

## 2013-05-22 LAB — CBC
HCT: 35.4 % — ABNORMAL LOW (ref 36.0–46.0)
Hemoglobin: 12.2 g/dL (ref 12.0–15.0)
MCH: 29.8 pg (ref 26.0–34.0)
MCHC: 34.5 g/dL (ref 30.0–36.0)
MCV: 86.3 fL (ref 78.0–100.0)
RBC: 4.1 MIL/uL (ref 3.87–5.11)

## 2013-05-22 LAB — CREATININE, SERUM: Creatinine, Ser: 0.91 mg/dL (ref 0.50–1.10)

## 2013-05-22 SURGERY — LEFT HEART CATHETERIZATION WITH CORONARY ANGIOGRAM
Anesthesia: LOCAL

## 2013-05-22 MED ORDER — MUSCLE RUB 10-15 % EX CREA
TOPICAL_CREAM | CUTANEOUS | Status: DC | PRN
  Filled 2013-05-22: qty 85

## 2013-05-22 MED ORDER — HEPARIN (PORCINE) IN NACL 2-0.9 UNIT/ML-% IJ SOLN
INTRAMUSCULAR | Status: AC
  Filled 2013-05-22: qty 1000

## 2013-05-22 MED ORDER — SODIUM CHLORIDE 0.9 % IV BOLUS (SEPSIS)
250.0000 mL | Freq: Once | INTRAVENOUS | Status: AC
Start: 2013-05-22 — End: 2013-05-22
  Administered 2013-05-22: 250 mL via INTRAVENOUS

## 2013-05-22 MED ORDER — SODIUM CHLORIDE 0.9 % IV SOLN: 1.0000 mL/kg/h | INTRAVENOUS | Status: AC

## 2013-05-22 MED ORDER — ASPIRIN 81 MG PO CHEW
324.0000 mg | CHEWABLE_TABLET | ORAL | Status: AC
  Administered 2013-05-22: 324 mg via ORAL
  Filled 2013-05-22: qty 4

## 2013-05-22 MED ORDER — LISINOPRIL 2.5 MG PO TABS
2.5000 mg | ORAL_TABLET | Freq: Every day | ORAL | Status: DC
  Administered 2013-05-22: 2.5 mg via ORAL
  Filled 2013-05-22 (×2): qty 1

## 2013-05-22 MED ORDER — SODIUM CHLORIDE 0.9 % IJ SOLN
3.0000 mL | Freq: Two times a day (BID) | INTRAMUSCULAR | Status: DC
  Administered 2013-05-22: 3 mL via INTRAVENOUS

## 2013-05-22 MED ORDER — SODIUM CHLORIDE 0.9 % IV SOLN: 250.0000 mL | INTRAVENOUS | Status: DC | PRN

## 2013-05-22 MED ORDER — HEPARIN SODIUM (PORCINE) 5000 UNIT/ML IJ SOLN
5000.0000 [IU] | Freq: Three times a day (TID) | INTRAMUSCULAR | Status: DC
  Administered 2013-05-23 – 2013-05-29 (×19): 5000 [IU] via SUBCUTANEOUS
  Filled 2013-05-22 (×22): qty 1

## 2013-05-22 MED ORDER — ENSURE COMPLETE PO LIQD
237.0000 mL | ORAL | Status: DC
  Administered 2013-05-23 – 2013-05-28 (×5): 237 mL via ORAL

## 2013-05-22 MED ORDER — LIDOCAINE HCL (PF) 1 % IJ SOLN
INTRAMUSCULAR | Status: AC
  Filled 2013-05-22: qty 30

## 2013-05-22 MED ORDER — SODIUM CHLORIDE 0.9 % IJ SOLN: 3.0000 mL | INTRAMUSCULAR | Status: DC | PRN

## 2013-05-22 MED ORDER — MIDAZOLAM HCL 2 MG/2ML IJ SOLN
INTRAMUSCULAR | Status: AC
  Filled 2013-05-22: qty 2

## 2013-05-22 MED ORDER — NITROGLYCERIN 0.2 MG/ML ON CALL CATH LAB
INTRAVENOUS | Status: AC
  Filled 2013-05-22: qty 1

## 2013-05-22 NOTE — Progress Notes (Signed)
PT BP 68/40, pt asymptomatic. NS going at 32ml/hr. MD paged. Baron Hamper, RN

## 2013-05-22 NOTE — Progress Notes (Signed)
Pt BP running in 80s. Jacobo Forest on call notified. HR also from 70-110 in afib, was in SR today. Told to notify MD if BP <80 systolic and HR > 120 in afib. Baron Hamper, RN

## 2013-05-22 NOTE — Interval H&P Note (Signed)
History and Physical Interval Note:  05/22/2013 4:20 PM  [name]  has presented today for surgery, with the diagnosis of cp  The various methods of treatment have been discussed with the patient and family. After consideration of risks, benefits and other options for treatment, the patient has consented to  Procedure(s): LEFT HEART CATHETERIZATION WITH CORONARY ANGIOGRAM (N/A) as a surgical intervention .  The patient's history has been reviewed, patient examined, no change in status, stable for surgery.  I have reviewed the patient's chart and labs.  Questions were answered to the patient's satisfaction.   Cath Lab Visit (complete for each Cath Lab visit)  Clinical Evaluation Leading to the Procedure:   ACS: no  Non-ACS:    Anginal Classification: CCS II  Anti-ischemic medical therapy: No Therapy  Non-Invasive Test Results: No non-invasive testing performed  Prior CABG: No previous CABG        Theron Arista [facility] 05/22/2013 4:20 PM

## 2013-05-22 NOTE — Progress Notes (Signed)
Patient: [name] / Admit Date: 05/20/2013 / Date of Encounter: 05/22/2013, 9:28 AM   Subjective  Breathing feels ok. No pain. Nursing states I/O's not accurate due to urinary incontinence but reports that she has had +++urination.  8/19: 126lb 8/21: 112lb Last outpt wt 7/17: 105lb   Objective  I/o not accurate Telemetry: NSR Physical Exam: Filed Vitals:   05/22/13 0638  BP: 117/63  Pulse: 71  Temp: 97.4 F (36.3 C)  Resp: 20   General: Well developd animated thin WF in no acute distress. Head: Normocephalic, atraumatic, sclera non-icteric, no xanthomas, nares are without discharge. Neck: JVD mildly elevated. Lungs: Diminished BS bilaterally at bases. Diffuse occasional wheeze upper lung fields. Breathing is unlabored. Heart: RRR S1 S2 without murmurs, rubs, or gallops.  Abdomen: Soft, non-tender, non-distended with normoactive bowel sounds. No hepatomegaly. No rebound/guarding. No obvious abdominal masses. Msk:  Strength and tone appear normal for age. Extremities: No clubbing or cyanosis. Tr pedal edema.  Distal pedal pulses are 2+ and equal bilaterally. Neuro: Alert and oriented X 3. Moves all extremities spontaneously. Psych:  Responds to questions appropriately with a normal affect.    Intake/Output Summary (Last 24 hours) at 05/22/13 0928 Last data filed at 05/22/13 0855  Gross per 24 hour  Intake    840 ml  Output    200 ml  Net    640 ml    Inpatient Medications:  . aspirin EC  81 mg Oral Daily  . carvedilol  3.125 mg Oral BID WC  . donepezil  5 mg Oral QHS  . enoxaparin (LOVENOX) injection  40 mg Subcutaneous Q24H  . furosemide  40 mg Intravenous Q12H  . levofloxacin (LEVAQUIN) IV  750 mg Intravenous Q48H  . lisinopril  2.5 mg Oral Daily  . metolazone  5 mg Oral Daily  . sodium chloride  3 mL Intravenous Q12H    Labs:  Recent Labs  05/21/13 0615 05/22/13 0545  NA 131* 132*  K 2.8* 3.7  CL 89* 91*  CO2 32 33*  GLUCOSE 95 99  BUN 19 21    CREATININE 0.72 0.77  CALCIUM 8.7 9.0  MG 1.8 1.9    Recent Labs  05/20/13 1438 05/21/13 0615  AST 24 21  ALT 27 23  ALKPHOS 59 54  BILITOT 0.4 0.6  PROT 5.9* 5.5*  ALBUMIN 3.0* 2.7*    Recent Labs  05/20/13 1802 05/21/13 0615  WBC 12.7* 11.6*  NEUTROABS  --  8.7*  HGB 12.6 11.7*  HCT 36.2 33.8*  MCV 85.2 84.3  PLT 399 359    Recent Labs  05/20/13 1804 05/20/13 2325 05/21/13 0615  TROPONINI <0.30 <0.30 <0.30   No components found with this basename: POCBNP,  No results found for this basename: HGBA1C,  in the last 72 hours   Radiology/Studies:  Dg Chest Port 1 View  05/21/2013   *RADIOLOGY REPORT*  Clinical Data: Congestive heart failure  PORTABLE CHEST - 1 VIEW  Comparison: 05/20/2013  Findings: Cardiac enlargement.  Pulmonary vascular congestion with mild edema.  Progression of right pleural effusion.  Small left effusion unchanged.  Bibasilar atelectasis has progressed. Subacute and chronic right rib fractures noted.  IMPRESSION: Progression of congestive heart failure.  Progression of right pleural effusion.   Original Report Authenticated By: Janeece Riggers, M.D.   Dg Chest Portable 1 View  05/20/2013   *RADIOLOGY REPORT*  Clinical Data: Chest pain and short of breath  PORTABLE CHEST - 1 VIEW  Comparison: 01/20/2013  Findings: Bilateral pleural effusions have developed since the prior study.  There is a moderately large effusion on the right and a small effusion on the left.  There are multiple healing right rib fractures. Acute right rib fractures were noted on the prior study. There also are chronic right rib fractures.  No pneumothorax.  There is compressive atelectasis in the right lower lobe.  Negative for heart failure or edema.  IMPRESSION: Moderately large right pleural effusion with right lower lobe atelectasis.  Small left effusion.  Negative for heart failure.  Healing right rib fractures.   Original Report Authenticated By: Janeece Riggers, M.D.      Assessment and Plan  1. Acute systolic CHF with EF 35% (diffuse HK, mild LVH, newly recognized cardiomyopathy) - still appears volume overloaded. With diffuse HK, question NICM but cannot exclude underlying CAD. Continue IV Lasix. Not sure if metolazone should be continued as a regularly scheduled med. Will ask MD to weigh in regarding this, as well as regarding ischemic eval particularly before comitting to possible anticoag. Check lipids. Continue Coreg. BP running on softer side, thus will add ACEI at low dose to start. 2. Atrial fibrillation, newly recognized - maintaining NSR. [id number] so at higher risk for stroke. Pt known to have balance issues. Also carries history of dementia, but she is completely [name] for me and states she lives at home alone with her cat. Will order PT evaluation before committing to anticoagulation and discuss with MD. Thyroid function below could also be playing a role. 3. Elevated free T4 - to internal medicine: is any further intervention necessary? 4. CAP - per IM. 5. Hyponatremia - BMET pending. 6. HTN - BP stable. 7. Hypokalemia - BMET pending, likely deranged from metolazone. Lytes per primary team. 8. Anemia, normocytic, mild - follow.  Signed, Ronie Spies PA-C Agree with above assessment and plan. I had the opportunity to speak with her daughter also. They are concerned about her dementia and her recent falls.Daughter states that patient has complained of recent chest pain. With her EF of 35% will arrange for left heart cath to rule out significant ischemic component. Discussed with patient and daughter who agree. Rhythm now back in NSR. Agree with holding off on commitment for long term anticoagulation until we assess her risk of falling with PT assessment. Marked weight loss from diuretic since admission. Will DC zaroxolyn.

## 2013-05-22 NOTE — Progress Notes (Signed)
Patient's last two SBP has been in the high 80s, patient is asymptomatic; night nurse is aware and MD has been paged.  Lorretta Harp RN

## 2013-05-22 NOTE — Progress Notes (Signed)
INITIAL NUTRITION ASSESSMENT  DOCUMENTATION CODES Per approved criteria  -Severe  malnutrition in the context of social or environmental circumstances   INTERVENTION: Add Ensure Complete po daily, each supplement provides 350 kcal and 13 grams of protein. RD provided "Heart Failure Nutrition Therapy" information to patient. RD to continue to follow nutrition care plan.  NUTRITION DIAGNOSIS: Malnutrition related to dementia as evidenced by severe muscle wasting and ongoing weight loss.   Goal: Intake to meet >90% of estimated nutrition needs.  Monitor:  weight trends, lab trends, I/O's, PO intake, supplement tolerance  Reason for Assessment: Malnutrition Screening Tool  79 y.o. female  Admitting Dx: Congestive heart failure, unspecified  ASSESSMENT: PMHx significant for HTN, dementia and afib. Admitted with increased SOB x several days and swelling in LE. Work-up reveals afib and new onset CHF. Pt also has had recent falls with her dementia.  Currently NPO, was previously on Heart Healthy diet, consumed 100% of her breakfast tray this morning.  Pt with ongoing weight loss. Pt's outpatient weight is 105 lb. Usual weight approximately 4-6 months ago was 120 - 125 lb. This is a weight change of 14% and is significant for the time frame. Family confirms this weight loss. They state that they are sometimes not home when patient is supposed to be eating at meal times and that they suspect that she is forgetting.   Family reports that pt is very particular with her oral intake. She typically eats: Breakfast: slim fast Lunch: cream cheese sandwich with raisin bread and whole milk Dinner: hot dogs with whole milk  Pt very confused and often interrupting RD conversation with family. Pt with visible severe muscle wasting in temples and clavicles.  Pt meets criteria for severe MALNUTRITION in the context of social/environmental circumstances as evidenced by severe muscle wasting and wt  loss of 14% x 4-6 months.  Family requested Low Sodium diet education materials - RD provided "Heart Failure Nutrition Therapy" handout to family.  Height: Ht Readings from Last 1 Encounters:  05/20/13 5\' 3"  (1.6 m)    Weight: Wt Readings from Last 1 Encounters:  05/22/13 112 lb 14 oz (51.2 kg)    Ideal Body Weight: 115 lb  % Ideal Body Weight: 97%  Wt Readings from Last 10 Encounters:  05/22/13 112 lb 14 oz (51.2 kg)  05/22/13 112 lb 14 oz (51.2 kg)  04/17/13 105 lb 3.2 oz (47.718 kg)  01/20/13 120 lb (54.432 kg)  08/01/12 119 lb 6.4 oz (54.159 kg)  06/26/12 116 lb (52.617 kg)  01/31/12 125 lb 12.8 oz (57.063 kg)  06/29/11 120 lb 3.2 oz (54.522 kg)  04/17/11 129 lb 6.4 oz (58.695 kg)  12/29/10 126 lb 6.4 oz (57.335 kg)    Usual Body Weight: 120 - 125 lb  % Usual Body Weight: 86%  BMI:  Body mass index is 20 kg/(m^2). WNL  Estimated Nutritional Needs: Kcal: 1200 - 1400 Protein: 51 - 65 g Fluid: 1.2 - 1.4 liters daily  Skin: intact  Diet Order: NPO  EDUCATION NEEDS: -No education needs identified at this time   Intake/Output Summary (Last 24 hours) at 05/22/13 1157 Last data filed at 05/22/13 1008  Gross per 24 hour  Intake    843 ml  Output    300 ml  Net    543 ml    Last BM: 8/20  Labs:   Recent Labs Lab 05/20/13 1438 05/20/13 1802 05/21/13 0615 05/22/13 0545  NA 131*  --  131* 132*  K 3.6  --  2.8* 3.7  CL 92*  --  89* 91*  CO2 28  --  32 33*  BUN 20  --  19 21  CREATININE 0.55 0.54 0.72 0.77  CALCIUM 9.1  --  8.7 9.0  MG  --   --  1.8 1.9  GLUCOSE 105*  --  95 99   Lipid Panel     Component Value Date/Time   CHOL 205* 06/29/2011 1340   TRIG 81.0 06/29/2011 1340   HDL 87.60 06/29/2011 1340   CHOLHDL 2 06/29/2011 1340   VLDL 16.2 06/29/2011 1340   LDLCALC 98 06/27/2010 0000    CBG (last 3)  No results found for this basename: GLUCAP,  in the last 72 hours  Scheduled Meds: . aspirin  324 mg Oral Pre-Cath  . aspirin EC  81 mg  Oral Daily  . carvedilol  3.125 mg Oral BID WC  . donepezil  5 mg Oral QHS  . enoxaparin (LOVENOX) injection  40 mg Subcutaneous Q24H  . furosemide  40 mg Intravenous Q12H  . levofloxacin (LEVAQUIN) IV  750 mg Intravenous Q48H  . lisinopril  2.5 mg Oral Daily  . sodium chloride  3 mL Intravenous Q12H  . sodium chloride  3 mL Intravenous Q12H    Continuous Infusions:   Past Medical History  Diagnosis Date  . Hypertension   . Osteoporosis   . Overactive bladder   . Congestive heart failure, unspecified 05/20/2013  . Dysrhythmia     NEW ATRIAL FIBRILATION  . Dementia   . Arthritis     Past Surgical History  Procedure Laterality Date  . Abdominal hysterectomy      TAH BSO  . Spine surgery  1980    Lumbar surgery    Jarold Motto MS, RD, LDN Pager: [phone] After-hours pager: [phone]

## 2013-05-22 NOTE — H&P (View-Only) (Signed)
Patient: [name] / Admit Date: 05/20/2013 / Date of Encounter: 05/22/2013, 9:28 AM   Subjective  Breathing feels ok. No pain. Nursing states I/O's not accurate due to urinary incontinence but reports that she has had +++urination.  8/19: 126lb 8/21: 112lb Last outpt wt 7/17: 105lb   Objective  I/o not accurate Telemetry: NSR Physical Exam: Filed Vitals:   05/22/13 0638  BP: 117/63  Pulse: 71  Temp: 97.4 F (36.3 C)  Resp: 20   General: Well developd animated thin WF in no acute distress. Head: Normocephalic, atraumatic, sclera non-icteric, no xanthomas, nares are without discharge. Neck: JVD mildly elevated. Lungs: Diminished BS bilaterally at bases. Diffuse occasional wheeze upper lung fields. Breathing is unlabored. Heart: RRR S1 S2 without murmurs, rubs, or gallops.  Abdomen: Soft, non-tender, non-distended with normoactive bowel sounds. No hepatomegaly. No rebound/guarding. No obvious abdominal masses. Msk:  Strength and tone appear normal for age. Extremities: No clubbing or cyanosis. Tr pedal edema.  Distal pedal pulses are 2+ and equal bilaterally. Neuro: Alert and oriented X 3. Moves all extremities spontaneously. Psych:  Responds to questions appropriately with a normal affect.    Intake/Output Summary (Last 24 hours) at 05/22/13 0928 Last data filed at 05/22/13 0855  Gross per 24 hour  Intake    840 ml  Output    200 ml  Net    640 ml    Inpatient Medications:  . aspirin EC  81 mg Oral Daily  . carvedilol  3.125 mg Oral BID WC  . donepezil  5 mg Oral QHS  . enoxaparin (LOVENOX) injection  40 mg Subcutaneous Q24H  . furosemide  40 mg Intravenous Q12H  . levofloxacin (LEVAQUIN) IV  750 mg Intravenous Q48H  . lisinopril  2.5 mg Oral Daily  . metolazone  5 mg Oral Daily  . sodium chloride  3 mL Intravenous Q12H    Labs:  Recent Labs  05/21/13 0615 05/22/13 0545  NA 131* 132*  K 2.8* 3.7  CL 89* 91*  CO2 32 33*  GLUCOSE 95 99  BUN 19 21    CREATININE 0.72 0.77  CALCIUM 8.7 9.0  MG 1.8 1.9    Recent Labs  05/20/13 1438 05/21/13 0615  AST 24 21  ALT 27 23  ALKPHOS 59 54  BILITOT 0.4 0.6  PROT 5.9* 5.5*  ALBUMIN 3.0* 2.7*    Recent Labs  05/20/13 1802 05/21/13 0615  WBC 12.7* 11.6*  NEUTROABS  --  8.7*  HGB 12.6 11.7*  HCT 36.2 33.8*  MCV 85.2 84.3  PLT 399 359    Recent Labs  05/20/13 1804 05/20/13 2325 05/21/13 0615  TROPONINI <0.30 <0.30 <0.30   No components found with this basename: POCBNP,  No results found for this basename: HGBA1C,  in the last 72 hours   Radiology/Studies:  Dg Chest Port 1 View  05/21/2013   *RADIOLOGY REPORT*  Clinical Data: Congestive heart failure  PORTABLE CHEST - 1 VIEW  Comparison: 05/20/2013  Findings: Cardiac enlargement.  Pulmonary vascular congestion with mild edema.  Progression of right pleural effusion.  Small left effusion unchanged.  Bibasilar atelectasis has progressed. Subacute and chronic right rib fractures noted.  IMPRESSION: Progression of congestive heart failure.  Progression of right pleural effusion.   Original Report Authenticated By: David Clark, M.D.   Dg Chest Portable 1 View  05/20/2013   *RADIOLOGY REPORT*  Clinical Data: Chest pain and short of breath  PORTABLE CHEST - 1 VIEW  Comparison: 01/20/2013    Findings: Bilateral pleural effusions have developed since the prior study.  There is a moderately large effusion on the right and a small effusion on the left.  There are multiple healing right rib fractures. Acute right rib fractures were noted on the prior study. There also are chronic right rib fractures.  No pneumothorax.  There is compressive atelectasis in the right lower lobe.  Negative for heart failure or edema.  IMPRESSION: Moderately large right pleural effusion with right lower lobe atelectasis.  Small left effusion.  Negative for heart failure.  Healing right rib fractures.   Original Report Authenticated By: David Clark, M.D.      Assessment and Plan  1. Acute systolic CHF with EF 35% (diffuse HK, mild LVH, newly recognized cardiomyopathy) - still appears volume overloaded. With diffuse HK, question NICM but cannot exclude underlying CAD. Continue IV Lasix. Not sure if metolazone should be continued as a regularly scheduled med. Will ask MD to weigh in regarding this, as well as regarding ischemic eval particularly before comitting to possible anticoag. Check lipids. Continue Coreg. BP running on softer side, thus will add ACEI at low dose to start. 2. Atrial fibrillation, newly recognized - maintaining NSR. [id number] so at higher risk for stroke. Pt known to have balance issues. Also carries history of dementia, but she is completely [name] for me and states she lives at home alone with her cat. Will order PT evaluation before committing to anticoagulation and discuss with MD. Thyroid function below could also be playing a role. 3. Elevated free T4 - to internal medicine: is any further intervention necessary? 4. CAP - per IM. 5. Hyponatremia - BMET pending. 6. HTN - BP stable. 7. Hypokalemia - BMET pending, likely deranged from metolazone. Lytes per primary team. 8. Anemia, normocytic, mild - follow.  Signed, Dayna Dunn PA-C Agree with above assessment and plan. I had the opportunity to speak with her daughter also. They are concerned about her dementia and her recent falls.Daughter states that patient has complained of recent chest pain. With her EF of 35% will arrange for left heart cath to rule out significant ischemic component. Discussed with patient and daughter who agree. Rhythm now back in NSR. Agree with holding off on commitment for long term anticoagulation until we assess her risk of falling with PT assessment. Marked weight loss from diuretic since admission. Will DC zaroxolyn.  

## 2013-05-22 NOTE — Progress Notes (Signed)
[facility] Progress note  [name] AOZ:[id number] DOB: 16-Jan-1928 DOA: 05/20/2013  Referring physician:  PCP: [name], DO  Specialists:   Chief Complaint: A. fib with RVR, new onset CHF  HPI: [name] is a 79 y.o.WF PMHx hypertension, dementia, A. fib with RVR presents with her family members from her doctor's office after she was found to be in atrial fibrillation. The family reports that the patient has had increased shortness of breath and has been rubbing her chest and leaning over for the last several days. This is relatively persistent, she has associated swelling in her lower extremities but no coughs fevers nausea vomiting or seizure activity. On my exam the patient has an irregularly irregular rhythm, no obvious murmurs, no JVD but does have bilateral 2+ pitting edema which is symmetrical and goes to the knees. She has scattered quiet rales at the bases but no respiratory distress and is speaking in full sentences. Her EKG shows atrial fibrillation with a rapid ventricular rate around 120 beats per minute, nonspecific T-wave changes. I agree with the interpretation of the EKG is provided by the resident. I personally seen and interpreted this EKG. She will need lab work, chest x-ray and admission to the hospital. I suspect that she may have a mild amount of congestive heart failure contributing to her arrhythmia. She appears hemodynamically stable. Currently doesn't lady sitting up in the bed in no acute strep distress except when she is asked to move around. Children are present and state patient's weight in July was 106lbs, positive productive cough or disease clear), positive DOE unable to walk from [place] without respiratory distress which is new onset, negative/C./S., Positive questionable chest pain on exertion. Weight on admission= 126lbs, current weight= 112lbs . NOTE; troponin x3 negative    Antibiotics Levaquin day 1/7(pharmacy to handle)   Procedure   Echocardiogram 05/21/2013  - Left ventricle: The cavity size was mildly dilated. Wall thickness was increased in a pattern of mild LVH. LVEF=35%.  Diffuse hypokinesis. - Mitral valve: Mild regurgitation. - Atrial septum: No defect or patent foramen ovale was Identified.  CXR 05/21/2013  Findings: Cardiac enlargement. Pulmonary vascular congestion with  mild edema. Progression of right pleural effusion. Small left  effusion unchanged.  Bibasilar atelectasis has progressed. Subacute and chronic right  rib fractures noted.  IMPRESSION:  Progression of congestive heart failure. Progression of right  pleural effusion.   Cardiac catheterization 05/22/2013 Left mainstem: Normal  Left anterior descending (LAD): The LAD is moderately calcified. There is an 80% stenosis in the proximal to mid vessel. The distal LAD appears relatively small.  There is a large ramus intermediate branch that trifurcates on the lateral wall. It is normal.  Left circumflex (LCx): The left circumflex is a tiny vessel that terminates early in the AV groove.  Right coronary artery (RCA): The RCA is a large dominant vessel. It gives rise to 3 posterolateral branches and the PDA. There is 30% disease in the proximal RCA. There is 30-40% disease in the proximal PDA.  Left ventriculography: Left ventricular systolic function is abnormal, LVEF is estimated at 50%, there is mild global hypokinesis.There is no significant mitral regurgitation  Final Conclusions:  1. Single vessel obstructive CAD involving the LAD.  2. Low normal LV systolic function. EF 50%.  3. Low LV filling pressures.  Recommendations: Hold IV diuretics. Optimize beta blocker therapy. If the patient has refractory chest pain despite optimal medical therapy with 2 antianginal agents she could be considered for PCI of  the LAD. Otherwise I would treat her with conservative medical therapy.      Past Medical History  Diagnosis Date  . Hypertension   .  Osteoporosis   . Overactive bladder   . Congestive heart failure, unspecified 05/20/2013  . Dysrhythmia     NEW ATRIAL FIBRILATION  . Dementia   . Arthritis    Past Surgical History  Procedure Laterality Date  . Abdominal hysterectomy      TAH BSO  . Spine surgery  1980    Lumbar surgery   Social History:  reports that she has never smoked. She has never used smokeless tobacco. She reports that  drinks alcohol. She reports that she does not use illicit drugs.No Known Allergies    Family History  Problem Relation Age of Onset  . Depression    . Heart attack    . Coronary artery disease    . Coronary artery disease Mother 26  . Heart attack Son 37      Prior to Admission medications   Medication Sig Start Date End Date Taking? Authorizing Provider  amLODipine (NORVASC) 5 MG tablet Take 1 tablet (5 mg total) by mouth daily. 04/17/13  Yes Yvonne R Lowne, DO  denosumab (PROLIA) 60 MG/ML SOLN Inject 60 mg into the skin every 6 (six) months.    Yes Historical Provider, MD  donepezil (ARICEPT) 5 MG tablet Take 1 tablet (5 mg total) by mouth at bedtime as needed. 04/17/13  Yes Yvonne R Lowne, DO  hydrochlorothiazide (HYDRODIURIL) 25 MG tablet Take 1 tablet (25 mg total) by mouth daily. 1 tab by mouth daily--office visit due now 04/17/13  Yes Lelon Perla, DO  Multiple Vitamins-Minerals (CENTRUM SILVER ADULT 50+) TABS Take 1 tablet by mouth daily.   Yes Historical Provider, MD  naproxen sodium (ANAPROX) 220 MG tablet Take 220 mg by mouth daily.   Yes Historical Provider, MD  polyethylene glycol powder (MIRALAX) powder Take 17 g by mouth as needed (constipation).    Yes Historical Provider, MD   Physical Exam: Filed Vitals:   05/21/13 1545 05/21/13 2147 05/22/13 0636 05/22/13 0638  BP:  103/63 117/63 117/63  Pulse:  81 72 71  Temp:  97.9 F (36.6 C)  97.4 F (36.3 C)  TempSrc:  Oral  Oral  Resp:  18  20  Height:      Weight:    51.2 kg (112 lb 14 oz)  SpO2: 100% 100%  95%      General:  Alert,NAD  Eyes: Pupils equal round reactive to light and accommodation  Neck: Negative JVD  Cardiovascular: Irregular irregular rhythm, regular rate, negative murmurs rubs or gallops, DP/PT pulse 2+ bilateral  Respiratory: Clear to auscultation except; rales in the left lower lobe, diminished breath sounds in right lower lobe  Abdomen: Soft, nontender, nondistended, plus bowel sounds  Musculoskeletal: No pitting edema bilateral   Psychiatric: Patient easily confused however also easily reoriented  Neurologic: Cranial nerves II through XII intact  Labs on Admission:  Basic Metabolic Panel:  Recent Labs Lab 05/20/13 1438 05/20/13 1802 05/21/13 0615 05/22/13 0545  NA 131*  --  131* 132*  K 3.6  --  2.8* 3.7  CL 92*  --  89* 91*  CO2 28  --  32 33*  GLUCOSE 105*  --  95 99  BUN 20  --  19 21  CREATININE 0.55 0.54 0.72 0.77  CALCIUM 9.1  --  8.7 9.0  MG  --   --  1.8 1.9   Liver Function Tests:  Recent Labs Lab 05/20/13 1438 05/21/13 0615  AST 24 21  ALT 27 23  ALKPHOS 59 54  BILITOT 0.4 0.6  PROT 5.9* 5.5*  ALBUMIN 3.0* 2.7*   No results found for this basename: LIPASE, AMYLASE,  in the last 168 hours No results found for this basename: AMMONIA,  in the last 168 hours CBC:  Recent Labs Lab 05/20/13 1438 05/20/13 1802 05/21/13 0615  WBC 13.4* 12.7* 11.6*  NEUTROABS  --   --  8.7*  HGB 12.7 12.6 11.7*  HCT 36.1 36.2 33.8*  MCV 85.1 85.2 84.3  PLT 414* 399 359   Cardiac Enzymes:  Recent Labs Lab 05/20/13 1804 05/20/13 2325 05/21/13 0615  TROPONINI <0.30 <0.30 <0.30    BNP (last 3 results)  Recent Labs  05/20/13 1438 05/20/13 1804  PROBNP 7162.0* 6451.0*   CBG: No results found for this basename: GLUCAP,  in the last 168 hours  Radiological Exams on Admission: Dg Chest Port 1 View  05/21/2013   *RADIOLOGY REPORT*  Clinical Data: Congestive heart failure  PORTABLE CHEST - 1 VIEW  Comparison: 05/20/2013  Findings:  Cardiac enlargement.  Pulmonary vascular congestion with mild edema.  Progression of right pleural effusion.  Small left effusion unchanged.  Bibasilar atelectasis has progressed. Subacute and chronic right rib fractures noted.  IMPRESSION: Progression of congestive heart failure.  Progression of right pleural effusion.   Original Report Authenticated By: Janeece Riggers, M.D.   Dg Chest Portable 1 View  05/20/2013   *RADIOLOGY REPORT*  Clinical Data: Chest pain and short of breath  PORTABLE CHEST - 1 VIEW  Comparison: 01/20/2013  Findings: Bilateral pleural effusions have developed since the prior study.  There is a moderately large effusion on the right and a small effusion on the left.  There are multiple healing right rib fractures. Acute right rib fractures were noted on the prior study. There also are chronic right rib fractures.  No pneumothorax.  There is compressive atelectasis in the right lower lobe.  Negative for heart failure or edema.  IMPRESSION: Moderately large right pleural effusion with right lower lobe atelectasis.  Small left effusion.  Negative for heart failure.  Healing right rib fractures.   Original Report Authenticated By: Janeece Riggers, M.D.    EKG: No previous EKG for comparison atrial fibrillation, nonspecific diffuse ST-T wave depression. Cannot rule out ischemia  Assessment/Plan Principal Problem:   Congestive heart failure, unspecified Active Problems:   HYPERTENSION   Atrial fibrillation with RVR   Dementia with behavioral disturbance   Leukocytosis, unspecified   Pleural effusion   Hyponatremia   1. CHF; cardiology DC'd Lasix, Cardizem, Zaroxolyn patient went to cardiac catheterization; see above report; --Pro -BNP= 6451 --Strict I.'s and O.'s, daily a.m. weights  2. Atrial fibrillation with RVR; see #1  3. Leukocytosis; most likely reactionary to patient's bilateral pleural effusion however given her age we'll treat as CAP. Patient on Levaquin day 1/7(pharmacy  to handle)   4. Dementia; continue Aricept   5. Pleural effusion; bilateral pleural effusion Rt> Lt if does not resolve within the next 72 hours consider thoracentesis --Obtain CXR in a.m.   6. Hyponatremia; we'll currently monitor should correct as patient diuresis     Family Communication: Son and daughter present for discussion of plan of care  Time spent 40 minutes   Drema Dallas [facility] Pager [phone]  If 7PM-7AM, please contact night-coverage [email or web address] Password [facility] 05/22/2013, 8:31 AM

## 2013-05-22 NOTE — Progress Notes (Signed)
Shift event: RN paged this NP secondary to pt having soft BP. I asked RN to recheck in one hour. Then, pt went as low as 68/40, but was completely asymptomatic. She is s/p cardiac cath today. NP to bedside. S: She feels "great" now that she got a good report about her cath today. She is tired because she hasn't slept in 2 nights due to worry. She denies any chest pain, SOB, or dizziness. Per RN, her cath site is without bleeding. O: BP 74/46 now. [name] is an 79 yo WF who appears very well. She is lying flat in bed in NAD. Daughter at bedside. Alert and oriented. Talkative, smiling. Afib per tele, but rate controlled. irreg irreg rhythm. Lungs with inspiratory wheezing at the bases. No rales or crackles noted. Good air exchange. Normal resp effort.  A/P: 1. Hypotension-her BP runs soft anyway and has been in the 80s pretty much all day. No bleeding noted to cath site. Giving 250cc bolus now and will monitor closely. Using fluids judiciously given her CHF. She is not due any BP meds tonight. She looks very good and is alert, so I think it is OK to watch her for now. Daughter/pt OK with plan. 2. PAF-was sinus earlier today. Rate controlled. Cardio following also. BP wouldn't hold for anti arrhythmics at present anyway. As long as rate < 120, will cont to watch. On 81mg  ASA per cardio. Jimmye Norman, NP [facility]

## 2013-05-22 NOTE — CV Procedure (Signed)
   Cardiac Catheterization Procedure Note  Name: [name] MRN: [id number] DOB: [birth year]  Procedure: Left Heart Cath, Selective Coronary Angiography, LV angiography  Indication: 79 yo WF with history of dementia, HTN presents with atrial fibrillation and new onset CHF. EF by Echo is 35%.   Procedural details: The right groin was prepped, draped, and anesthetized with 1% lidocaine. Using modified Seldinger technique, a 5 French sheath was introduced into the right femoral artery. Standard Judkins catheters were used for coronary angiography and left ventriculography. Catheter exchanges were performed over a guidewire. There were no immediate procedural complications. The patient was transferred to the post catheterization recovery area for further monitoring.  Procedural Findings: Hemodynamics:  AO 98/49 mean 69 mmHg LV 97/10 mm Hg   Coronary angiography: Coronary dominance: right  Left mainstem: Normal  Left anterior descending (LAD): The LAD is moderately calcified. There is an 80% stenosis in the proximal to mid vessel. The distal LAD appears relatively small.  There is a large ramus intermediate branch that trifurcates on the lateral wall. It is normal.  Left circumflex (LCx): The left circumflex is a tiny vessel that terminates early in the AV groove.   Right coronary artery (RCA): The RCA is a large dominant vessel. It gives rise to 3 posterolateral branches and the PDA. There is 30% disease in the proximal RCA. There is 30-40% disease in the proximal PDA.  Left ventriculography: Left ventricular systolic function is abnormal, LVEF is estimated at 50%, there is mild global hypokinesis.There is no significant mitral regurgitation   Final Conclusions:   1. Single vessel obstructive CAD involving the LAD. 2. Low normal LV systolic function. EF 50%. 3. Low LV filling pressures.   Recommendations: Hold IV diuretics. Optimize beta blocker therapy. If the patient has refractory  chest pain despite optimal medical therapy with 2 antianginal agents she could be considered for PCI of the LAD. Otherwise I would treat her with conservative medical therapy.  Theron Arista [facility] 05/22/2013, 4:49 PM

## 2013-05-22 NOTE — Progress Notes (Signed)
Patient evaluated for community based chronic disease management services with [facility] Care Management Program as a benefit of patient's [employer].  Spoke with patient's son and daughter at bedside to explain [facility] Care Management services.  Both were appreciative for the information but feel that they can manage their mothers health without assistance at this time.  Left contact information and [facility] literature at bedside. Made inpatient Case Manager aware that [facility] Care Management following. Of note, [facility] Care Management services does not replace or interfere with any services that are arranged by inpatient case management or social work.  For additional questions or referrals please contact Anibal Henderson BSN RN [facility] Liaison at [phone].

## 2013-05-22 NOTE — Evaluation (Signed)
Physical Therapy Evaluation Patient Details Name: [name] MRN: [id number] DOB: Sep 17, 1928 Today's Date: 05/22/2013 Time: 2725-3664 PT Time Calculation (min): 37 min  PT Assessment / Plan / Recommendation History of Present Illness  79 y.o. female admitted to [facility] on 05/20/13 with PMHx hypertension, dementia, A. fib with RVR presents with her family members from her doctor's office after she was found to be in atrial fibrillation. The family reports that the patient has had increased shortness of breath and has been rubbing her chest and leaning over for the last several days. This is relatively persistent, she has associated swelling in her lower extremities.  Pt diagnosed with new onset CHF.    Clinical Impression  The pt is very fearful of falling and this is limiting her ambulation.  Daughter was present for entire session and this helped with encouragement to participate.  O2 sats on RA 96% and remained in the 90s during all mobility on RA, so O2 left off of pt and RN informed.      PT Assessment  Patient needs continued PT services    Follow Up Recommendations  Home health PT;Supervision/Assistance - 24 hour    Does the patient have the potential to tolerate intense rehabilitation     NA  Barriers to Discharge Decreased caregiver support Daughter planning to increase caregivers to 24/7    Equipment Recommendations  Wheelchair (measurements PT);Wheelchair cushion (measurements PT) (standard 18x18 with basic cushion)    Recommendations for Other Services   none  Frequency Min 3X/week    Precautions / Restrictions Precautions Precautions: Fall Precaution Comments: monitor O2 sats with mobility, pt is VERY fearful of falling.     Pertinent Vitals/Pain See vitals flow sheet.       Mobility  Bed Mobility Bed Mobility: Supine to Sit;Sitting - Scoot to Edge of Bed Supine to Sit: 4: Min assist;With rails;HOB elevated Sitting - Scoot to [place] of Bed: 4: Min assist;With  rail Details for Bed Mobility Assistance: min assist to support trunk to keep pt from falling posteriorly.   Transfers Transfers: Sit to Stand;Stand to Sit;Stand Pivot Transfers Sit to Stand: With upper extremity assist;With armrests;From bed;From chair/3-in-1;3: Mod assist Stand to Sit: 3: Mod assist;With upper extremity assist;With armrests;To chair/3-in-1;To bed Stand Pivot Transfers: 3: Mod assist;From elevated surface;With armrests (with RW) Details for Transfer Assistance: mod assist to support trunk for balance and anteriorly weight shift body over feet.  Pt is very fearful of falling.   Ambulation/Gait Ambulation/Gait Assistance: Not tested (comment) (due to fear of falling. )        PT Diagnosis: Difficulty walking;Abnormality of gait;Generalized weakness;Altered mental status  PT Problem List: Decreased strength;Decreased activity tolerance;Decreased balance;Decreased mobility;Decreased coordination;Decreased cognition;Decreased knowledge of use of DME PT Treatment Interventions: DME instruction;Gait training;Functional mobility training;Therapeutic activities;Therapeutic exercise;Balance training;Neuromuscular re-education;Cognitive remediation;Patient/family education;Wheelchair mobility training     PT Goals(Current goals can be found in the care plan section) Acute Rehab PT Goals Patient Stated Goal: to go home, avoid falling.   PT Goal Formulation: With patient Time For Goal Achievement: 06/05/13 Potential to Achieve Goals: Good  Visit Information  Last PT Received On: 05/22/13 Assistance Needed: +1 History of Present Illness: 79 y.o. female admitted to [facility] on 05/20/13 with PMHx hypertension, dementia, A. fib with RVR presents with her family members from her doctor's office after she was found to be in atrial fibrillation. The family reports that the patient has had increased shortness of breath and has been rubbing her chest and leaning over for  the last several days.  This is relatively persistent, she has associated swelling in her lower extremities.  Pt diagnosed with new onset CHF.         Prior Functioning  Home Living Family/patient expects to be discharged to:: Private residence Living Arrangements: Alone Available Help at Discharge: Personal care attendant;Available PRN/intermittently (3 times a week, family checking on her daily as well) Type of Home: House Home Access: Level entry Home Layout: One level Home Equipment: Walker - 2 wheels;Shower seat - built in (elevated toilets) Additional Comments: Pt has some baseline dementia, but was very functional at home in her normal environment.   Prior Function Level of Independence: Needs assistance Gait / Transfers Assistance Needed: mod I with RW ADL's / Homemaking Assistance Needed: total assist with house cleaning and cooking, independent with basic dressing, assist for showers Communication / Swallowing Assistance Needed: none Comments: Daughter reports she is going to increase the help at home now.   Communication Communication: No difficulties    Cognition  Cognition Arousal/Alertness: Awake/alert Behavior During Therapy: WFL for tasks assessed/performed Overall Cognitive Status: History of cognitive impairments - at baseline    Extremity/Trunk Assessment Upper Extremity Assessment Upper Extremity Assessment: Generalized weakness Lower Extremity Assessment Lower Extremity Assessment: Generalized weakness Cervical / Trunk Assessment Cervical / Trunk Assessment: Kyphotic      End of Session PT - End of Session Activity Tolerance: Patient limited by fatigue;Other (comment) (limited by fear) Patient left: in chair;with call bell/phone within reach;with family/visitor present Nurse Communication: Mobility status;Other (comment) (O2 sats stable on RA and left O2 [place] off.  )    Reymond Maynez B. Mirjana Tarleton, PT, DPT [phone]   05/22/2013, 2:32 PM

## 2013-05-22 NOTE — Progress Notes (Signed)
Patient has arrived back from Cath Lab, patient is stable and has frequent vitals set up in room and on bedrest x4hrs; will continue to monitor patient. Lorretta Harp RN

## 2013-05-22 NOTE — Progress Notes (Signed)
Pt BP 82/38 30 minutes post 250 cc bolus. Pt still asymptomatic. Daughter notified of BP and updated on plan of care. [name] notified via [facility] text page. Will continue to monitor pt.

## 2013-05-23 ENCOUNTER — Ambulatory Visit: Payer: Medicare Other | Admitting: Family Medicine

## 2013-05-23 ENCOUNTER — Inpatient Hospital Stay (HOSPITAL_COMMUNITY): Payer: Medicare Other

## 2013-05-23 DIAGNOSIS — I428 Other cardiomyopathies: Secondary | ICD-10-CM

## 2013-05-23 DIAGNOSIS — I5023 Acute on chronic systolic (congestive) heart failure: Secondary | ICD-10-CM

## 2013-05-23 DIAGNOSIS — I2510 Atherosclerotic heart disease of native coronary artery without angina pectoris: Secondary | ICD-10-CM

## 2013-05-23 LAB — BASIC METABOLIC PANEL
Calcium: 8.9 mg/dL (ref 8.4–10.5)
GFR calc Af Amer: 67 mL/min — ABNORMAL LOW (ref >90)
GFR calc non Af Amer: 58 mL/min — ABNORMAL LOW (ref >90)
Potassium: 3.8 mEq/L (ref 3.5–5.1)
Sodium: 130 mEq/L — ABNORMAL LOW (ref 135–145)

## 2013-05-23 LAB — LIPID PANEL
Cholesterol: 126 mg/dL (ref 0–200)
HDL: 57 mg/dL (ref >39)
Triglycerides: 57 mg/dL (ref <150)
VLDL: 11 mg/dL (ref 0–40)

## 2013-05-23 LAB — MAGNESIUM: Magnesium: 1.9 mg/dL (ref 1.5–2.5)

## 2013-05-23 MED ORDER — ATORVASTATIN CALCIUM 20 MG PO TABS
20.0000 mg | ORAL_TABLET | Freq: Every day | ORAL | Status: DC
  Administered 2013-05-23 – 2013-05-28 (×6): 20 mg via ORAL
  Filled 2013-05-23 (×7): qty 1

## 2013-05-23 NOTE — Progress Notes (Signed)
Bandage removed from groin site and bandaid placed. Small nodule noted under site. Area is not raised and small bruise noted at cath puncture site. Will continue to monitor site. Baron Hamper, RN

## 2013-05-23 NOTE — Progress Notes (Signed)
Pt HR went up to 140's, nonsustained and pt asymptomatic. EKG done per order of MD.

## 2013-05-23 NOTE — Progress Notes (Addendum)
[facility] Progress note  [name] ZOX:[id number] DOB: 15-Apr-1928 DOA: 05/20/2013  Referring physician:  PCP: [name], DO  Specialists:   Chief Complaint: A. fib with RVR, new onset CHF  HPI: [name] is a 79 y.o.WF PMHx hypertension, dementia, A. fib with RVR presents with her family members from her doctor's office after she was found to be in atrial fibrillation. The family reports that the patient has had increased shortness of breath and has been rubbing her chest and leaning over for the last several days. This is relatively persistent, she has associated swelling in her lower extremities but no coughs fevers nausea vomiting or seizure activity. On my exam the patient has an irregularly irregular rhythm, no obvious murmurs, no JVD but does have bilateral 2+ pitting edema which is symmetrical and goes to the knees. She has scattered quiet rales at the bases but no respiratory distress and is speaking in full sentences. Her EKG shows atrial fibrillation with a rapid ventricular rate around 120 beats per minute, nonspecific T-wave changes. I agree with the interpretation of the EKG is provided by the resident. I personally seen and interpreted this EKG. She will need lab work, chest x-ray and admission to the hospital. I suspect that she may have a mild amount of congestive heart failure contributing to her arrhythmia. She appears hemodynamically stable. Currently doesn't lady sitting up in the bed in no acute strep distress except when she is asked to move around. Children are present and state patient's weight in July was 106lbs, positive productive cough or disease clear), positive DOE unable to walk from [place] without respiratory distress which is new onset, negative/C./S., Positive questionable chest pain on exertion. Weight on admission= 126lbs, current weight= 113lbs . NOTE; troponin x3 negative TODAY states she agrees that she should not go home alone. Daughter states she  is working on trying to find 24-hour help for mother.    Antibiotics Levaquin day 2/7(pharmacy to handle)   Procedure  Echocardiogram 05/21/2013  - Left ventricle: The cavity size was mildly dilated. Wall thickness was increased in a pattern of mild LVH. LVEF=35%.  Diffuse hypokinesis. - Mitral valve: Mild regurgitation. - Atrial septum: No defect or patent foramen ovale was Identified.  CXR 05/21/2013  Findings: Cardiac enlargement. Pulmonary vascular congestion with  mild edema. Progression of right pleural effusion. Small left  effusion unchanged.  Bibasilar atelectasis has progressed. Subacute and chronic right  rib fractures noted.  IMPRESSION:  Progression of congestive heart failure. Progression of right  pleural effusion.   Cardiac catheterization 05/22/2013 Left mainstem: Normal  Left anterior descending (LAD): The LAD is moderately calcified. There is an 80% stenosis in the proximal to mid vessel. The distal LAD appears relatively small.  There is a large ramus intermediate branch that trifurcates on the lateral wall. It is normal.  Left circumflex (LCx): The left circumflex is a tiny vessel that terminates early in the AV groove.  Right coronary artery (RCA): The RCA is a large dominant vessel. It gives rise to 3 posterolateral branches and the PDA. There is 30% disease in the proximal RCA. There is 30-40% disease in the proximal PDA.  Left ventriculography: Left ventricular systolic function is abnormal, LVEF is estimated at 50%, there is mild global hypokinesis.There is no significant mitral regurgitation  Final Conclusions:  1. Single vessel obstructive CAD involving the LAD.  2. Low normal LV systolic function. EF 50%.  3. Low LV filling pressures.  Recommendations: Hold IV diuretics. Optimize beta  blocker therapy. If the patient has refractory chest pain despite optimal medical therapy with 2 antianginal agents she could be considered for PCI of the LAD. Otherwise I  would treat her with conservative medical therapy.      Past Medical History  Diagnosis Date  . Hypertension   . Osteoporosis   . Overactive bladder   . Congestive heart failure, unspecified 05/20/2013  . Dysrhythmia     NEW ATRIAL FIBRILATION  . Dementia   . Arthritis    Past Surgical History  Procedure Laterality Date  . Abdominal hysterectomy      TAH BSO  . Spine surgery  1980    Lumbar surgery   Social History:  reports that she has never smoked. She has never used smokeless tobacco. She reports that  drinks alcohol. She reports that she does not use illicit drugs.No Known Allergies    Family History  Problem Relation Age of Onset  . Depression    . Heart attack    . Coronary artery disease    . Coronary artery disease Mother 30  . Heart attack Son 41      Prior to Admission medications   Medication Sig Start Date End Date Taking? Authorizing Provider  amLODipine (NORVASC) 5 MG tablet Take 1 tablet (5 mg total) by mouth daily. 04/17/13  Yes Yvonne R Lowne, DO  denosumab (PROLIA) 60 MG/ML SOLN Inject 60 mg into the skin every 6 (six) months.    Yes Historical Provider, MD  donepezil (ARICEPT) 5 MG tablet Take 1 tablet (5 mg total) by mouth at bedtime as needed. 04/17/13  Yes Yvonne R Lowne, DO  hydrochlorothiazide (HYDRODIURIL) 25 MG tablet Take 1 tablet (25 mg total) by mouth daily. 1 tab by mouth daily--office visit due now 04/17/13  Yes Lelon Perla, DO  Multiple Vitamins-Minerals (CENTRUM SILVER ADULT 50+) TABS Take 1 tablet by mouth daily.   Yes Historical Provider, MD  naproxen sodium (ANAPROX) 220 MG tablet Take 220 mg by mouth daily.   Yes Historical Provider, MD  polyethylene glycol powder (MIRALAX) powder Take 17 g by mouth as needed (constipation).    Yes Historical Provider, MD   Physical Exam: Filed Vitals:   05/22/13 2318 05/23/13 0225 05/23/13 1610 05/23/13 0740  BP: 81/47 93/55 92/58    Pulse:  67  61  Temp:    97.5 F (36.4 C)  TempSrc:     Oral  Resp:  17  18  Height:      Weight:    51.256 kg (113 lb)  SpO2:  95%  96%     General:  Alert,NAD  Eyes: Pupils equal round reactive to light and accommodation  Neck: Negative JVD  Cardiovascular: Irregular irregular rhythm, regular rate, negative murmurs rubs or gallops, DP/PT pulse 2+ bilateral  Respiratory: Clear to auscultation except; diminished sounds left lower lobe, diminished breath sounds in right lower lobe  Abdomen: Soft, nontender, nondistended, plus bowel sounds  Musculoskeletal: No pitting edema bilateral   Psychiatric: Patient easily confused however also easily reoriented  Neurologic: Cranial nerves II through XII intact  Labs on Admission:  Basic Metabolic Panel:  Recent Labs Lab 05/20/13 1438 05/20/13 1802 05/21/13 0615 05/22/13 0545 05/22/13 2030 05/23/13 0500  NA 131*  --  131* 132*  --  130*  K 3.6  --  2.8* 3.7  --  3.8  CL 92*  --  89* 91*  --  89*  CO2 28  --  32 33*  --  32  GLUCOSE 105*  --  95 99  --  89  BUN 20  --  19 21  --  29*  CREATININE 0.55 0.54 0.72 0.77 0.91 0.90  CALCIUM 9.1  --  8.7 9.0  --  8.9  MG  --   --  1.8 1.9  --  1.9   Liver Function Tests:  Recent Labs Lab 05/20/13 1438 05/21/13 0615  AST 24 21  ALT 27 23  ALKPHOS 59 54  BILITOT 0.4 0.6  PROT 5.9* 5.5*  ALBUMIN 3.0* 2.7*   No results found for this basename: LIPASE, AMYLASE,  in the last 168 hours No results found for this basename: AMMONIA,  in the last 168 hours CBC:  Recent Labs Lab 05/20/13 1438 05/20/13 1802 05/21/13 0615 05/22/13 2030  WBC 13.4* 12.7* 11.6* 9.4  NEUTROABS  --   --  8.7*  --   HGB 12.7 12.6 11.7* 12.2  HCT 36.1 36.2 33.8* 35.4*  MCV 85.1 85.2 84.3 86.3  PLT 414* 399 359 357   Cardiac Enzymes:  Recent Labs Lab 05/20/13 1804 05/20/13 2325 05/21/13 0615  TROPONINI <0.30 <0.30 <0.30    BNP (last 3 results)  Recent Labs  05/20/13 1438 05/20/13 1804  PROBNP 7162.0* 6451.0*   CBG: No results found for  this basename: GLUCAP,  in the last 168 hours  Radiological Exams on Admission: Dg Chest 2 View  05/23/2013   *RADIOLOGY REPORT*  Clinical Data: Bilateral pleural effusions.  CHEST - 2 VIEW  Comparison: 05/21/2013 and 05/20/2013  Findings: Two views of the chest demonstrate bilateral pleural effusions, right side greater than left.  Right pleural effusion is small-moderate in size.  Heart size is stable and within normal limits.  Thoracic aorta is heavily calcified.  There is fluid within the right minor fissure.  Probable atelectasis or consolidation along the medial right lung base.  Few linear densities in the right lower lung are suggestive for atelectasis. There are old right rib fractures.  No evidence for a pneumothorax.  IMPRESSION: Bilateral pleural effusions, right side greater than left. The pleural effusions have minimally changed since 05/20/2013.   Original Report Authenticated By: Richarda Overlie, M.D.    EKG: No previous EKG for comparison atrial fibrillation, nonspecific diffuse ST-T wave depression. Cannot rule out ischemia  --- EKG 05/23/2013 while patient is on a heart rate of approximately 130 -140 showed a variable AV block A. fib/A. flutter   Assessment/Plan Principal Problem:   Congestive heart failure, unspecified Active Problems:   HYPERTENSION   Atrial fibrillation with RVR   Dementia with behavioral disturbance   Leukocytosis, unspecified   Pleural effusion   Hyponatremia   Coronary atherosclerosis of native coronary artery   Non-ischemic cardiomyopathy   1. CHF; cardiology DC'd Lasix, Cardizem, Zaroxolyn patient went to cardiac catheterization; see above report; --Pro -BNP= 6451 --Strict I.'s and O.'s, daily a.m. weights  2. Atrial fibrillation with RVR; see #1; phone consultation with Dr. Nicholes Mango (cardiology), since patient asymptomatic continue current treatment   3. Leukocytosis; most likely reactionary to patient's bilateral pleural effusion however  given her age we'll treat as CAP. Leukocytosis resolving continue Patient on Levaquin day 2/7(pharmacy to handle)   4. Dementia; continue Aricept   5. Pleural effusion; bilateral pleural effusion Rt> Lt if does not resolve within the next 72 hours consider thoracentesis --CXR continues to show bilateral pleural effusion Rt> Lt since patient did not desat on ambulation would not perform thoracentesis at this time  6. Hyponatremia; we'll currently monitor should correct as patient diuresis. Currently asymptomatic if in the a.m. sodium does not begin to rise will consider additional workup.Marland Kitchen --- Consider adding additional salt to diet  7. SOB; SATURATION QUALIFICATIONS: (This note is used to comply with regulatory documentation for home oxygen)  Patient Saturations on Room Air at Rest = 94%  Patient Saturations on Room Air while Ambulating = 92%  Patient Saturations on 0 Liters of oxygen while Ambulating = Room Air  Please briefly explain why patient needs home oxygen:  Pt spo2 dropped to 87% briefly while ambulating. Stopped, took deep breaths and recovered to 94% on room air. --Patient qualifies for home O2 while ambulating  8. discharge planning; PT recommends 24-hour care, OT will see patient in the a.m. --- Please coordinate with daughter as she is trying to arrange recommended 24-hour coverage for patient     Family Communication: Son and daughter present for discussion of plan of care  Time spent 40 minutes   Drema Dallas [facility] Pager [phone]  If 7PM-7AM, please contact night-coverage [email or web address] Password [facility] 05/23/2013, 2:11 PM

## 2013-05-23 NOTE — Progress Notes (Signed)
ANTIBIOTIC CONSULT NOTE   Pharmacy Consult for Levaquin, day 3/7 Indication: Possible CAP or UTI  No Known Allergies  Patient Measurements: Height: 5\' 3"  (160 cm) Weight: 113 lb (51.256 kg) IBW/kg (Calculated) : 52.4    Vital Signs: Temp: 97.5 F (36.4 C) (08/22 0740) Temp src: Oral (08/22 0740) BP: 92/58 mmHg (08/22 1610) Pulse Rate: 61 (08/22 0740) Intake/Output from previous day: 08/21 0701 - 08/22 0700 In: 513 [P.O.:360; I.V.:3; IV Piggyback:150] Out: 375 [Urine:375] Intake/Output from this shift: Total I/O In: 60 [P.O.:60] Out: -   Labs:  Recent Labs  05/20/13 1802 05/21/13 0615 05/21/13 1441 05/22/13 0545 05/22/13 2030 05/23/13 0500  WBC 12.7* 11.6*  --   --  9.4  --   HGB 12.6 11.7*  --   --  12.2  --   PLT 399 359  --   --  357  --   LABCREA  --   --  14.22  --   --   --   CREATININE 0.54 0.72  --  0.77 0.91 0.90   Estimated Creatinine Clearance: 38.4 ml/min (by C-G formula based on Cr of 0.9). No results found for this basename: VANCOTROUGH, VANCOPEAK, VANCORANDOM, GENTTROUGH, GENTPEAK, GENTRANDOM, TOBRATROUGH, TOBRAPEAK, TOBRARND, AMIKACINPEAK, AMIKACINTROU, AMIKACIN,  in the last 72 hours   Microbiology: Recent Results (from the past 720 hour(s))  CULTURE, BLOOD (ROUTINE X 2)     Status: None   Collection Time    05/20/13  5:30 PM      Result Value Range Status   Specimen Description BLOOD RIGHT ANTECUBITAL   Final   Special Requests BOTTLES DRAWN AEROBIC AND ANAEROBIC 10CC   Final   Culture  Setup Time     Final   Value: 05/21/2013 01:29     Performed at [facility]   Culture     Final   Value:        BLOOD CULTURE RECEIVED NO GROWTH TO DATE CULTURE WILL BE HELD FOR 5 DAYS BEFORE ISSUING A FINAL NEGATIVE REPORT     Performed at [facility]   Report Status PENDING   Incomplete  CULTURE, BLOOD (ROUTINE X 2)     Status: None   Collection Time    05/20/13  5:45 PM      Result Value Range Status   Specimen Description BLOOD  LEFT ANTECUBITAL   Final   Special Requests BOTTLES DRAWN AEROBIC AND ANAEROBIC 10CC   Final   Culture  Setup Time     Final   Value: 05/21/2013 02:12     Performed at [facility]   Culture     Final   Value:        BLOOD CULTURE RECEIVED NO GROWTH TO DATE CULTURE WILL BE HELD FOR 5 DAYS BEFORE ISSUING A FINAL NEGATIVE REPORT     Performed at [facility]   Report Status PENDING   Incomplete  URINE CULTURE     Status: None   Collection Time    05/21/13  2:30 AM      Result Value Range Status   Specimen Description URINE, CLEAN CATCH   Final   Special Requests NONE   Final   Culture  Setup Time     Final   Value: 05/21/2013 05:30     Performed at [facility] Count     Final   Value: NO GROWTH     Performed at [facility]  Final   Value: NO GROWTH     Performed at [facility]   Report Status 05/22/2013 FINAL   Final    Medical History: Past Medical History  Diagnosis Date  . Hypertension   . Osteoporosis   . Overactive bladder   . Congestive heart failure, unspecified 05/20/2013  . Dysrhythmia     NEW ATRIAL FIBRILATION  . Dementia   . Arthritis     Medications:  Scheduled:  . aspirin EC  81 mg Oral Daily  . atorvastatin  20 mg Oral q1800  . carvedilol  3.125 mg Oral BID WC  . donepezil  5 mg Oral QHS  . feeding supplement  237 mL Oral Q24H  . heparin  5,000 Units Subcutaneous Q8H  . levofloxacin (LEVAQUIN) IV  750 mg Intravenous Q48H  . sodium chloride  3 mL Intravenous Q12H   Assessment: 79 yr old woman was sent to the hospital directly from her MD office visit. She was having chest pain and shortness of breath. She also had new onset a.fib. She is being treated with Levaquin for empiric coverage for  pneumonia or a UTI.  Goal of Therapy:  eradication of infection  Plan:  Cont levaquin 750 mg IV q48 hrs for total 7 days.  Celedonio Sortino Poteet 05/23/2013,9:51 AM

## 2013-05-23 NOTE — Progress Notes (Signed)
SUBJECTIVE: No complaints this am.   BP 92/58  Pulse 61  Temp(Src) 97.5 F (36.4 C) (Oral)  Resp 18  Ht 5\' 3"  (1.6 m)  Wt 113 lb (51.256 kg)  BMI 20.02 kg/m2  SpO2 96%  Intake/Output Summary (Last 24 hours) at 05/23/13 9604 Last data filed at 05/23/13 0121  Gross per 24 hour  Intake    513 ml  Output    375 ml  Net    138 ml    PHYSICAL EXAM General: Well developed, well nourished, in no acute distress. Alert and oriented x 3.  Psych:  Good affect, responds appropriately Neck: No JVD. No masses noted.  Lungs: Clear bilaterally with no wheezes or rhonci noted.  Heart: Irreg irreg with no murmurs noted. Abdomen: Bowel sounds are present. Soft, non-tender.  Extremities: No lower extremity edema.   LABS: Basic Metabolic Panel:  Recent Labs  54/09/81 0545 05/22/13 2030 05/23/13 0500  NA 132*  --  130*  K 3.7  --  3.8  CL 91*  --  89*  CO2 33*  --  32  GLUCOSE 99  --  89  BUN 21  --  29*  CREATININE 0.77 0.91 0.90  CALCIUM 9.0  --  8.9  MG 1.9  --  1.9   CBC:  Recent Labs  05/21/13 0615 05/22/13 2030  WBC 11.6* 9.4  NEUTROABS 8.7*  --   HGB 11.7* 12.2  HCT 33.8* 35.4*  MCV 84.3 86.3  PLT 359 357   Cardiac Enzymes:  Recent Labs  05/20/13 1804 05/20/13 2325 05/21/13 0615  TROPONINI <0.30 <0.30 <0.30   Fasting Lipid Panel:  Recent Labs  05/23/13 0500  CHOL 126  HDL 57  LDLCALC 58  TRIG 57  CHOLHDL 2.2    Current Meds: . aspirin EC  81 mg Oral Daily  . carvedilol  3.125 mg Oral BID WC  . donepezil  5 mg Oral QHS  . feeding supplement  237 mL Oral Q24H  . heparin  5,000 Units Subcutaneous Q8H  . levofloxacin (LEVAQUIN) IV  750 mg Intravenous Q48H  . lisinopril  2.5 mg Oral Daily  . sodium chloride  3 mL Intravenous Q12H    Cardiac cath 05/22/13: Left mainstem: Normal  Left anterior descending (LAD): The LAD is moderately calcified. There is an 80% stenosis in the proximal to mid vessel. The distal LAD appears relatively small.    There is a large ramus intermediate branch that trifurcates on the lateral wall. It is normal.  Left circumflex (LCx): The left circumflex is a tiny vessel that terminates early in the AV groove.  Right coronary artery (RCA): The RCA is a large dominant vessel. It gives rise to 3 posterolateral branches and the PDA. There is 30% disease in the proximal RCA. There is 30-40% disease in the proximal PDA.  Left ventriculography: Left ventricular systolic function is abnormal, LVEF is estimated at 50%, there is mild global hypokinesis.There is no significant mitral regurgitation  Final Conclusions:  1. Single vessel obstructive CAD involving the LAD.  2. Low normal LV systolic function. EF 50%.  3. Low LV filling pressures.   ASSESSMENT AND PLAN:  1. Non-ischemic cardiomyopathy:  LVEF 35% with diffuse HK, mild LVH. Cath yesterday with moderately severe proximal to mid LAD stenosis. The plan will be for conservative treatment at this time unless she has refractory angina. Will continue beta blocker as tolerated with hypotension but will hold Lisinopril secondary to hypotension.  2. Acute systolic CHF: I/O inaccurate. Weight down 13 lbs since admission. She has been hypotensive for last 24 hours. Would continue to hold Lasix today.  Volume status better than on admission.   3. CAD: As above, pt with moderately severe proximal to mid LAD stenosis and mild disease in the RCA. Will attempt medical management for now with ASA, statin, beta blocker. If she has refractory angina, could pursue PCI of the LAD. Will start statin today.    4. Atrial fibrillation: Back in atrial fib this am. Continue beta blocker. Not a good candidate for amiodarone with abnormal thyroid function tests. Long term anti-coagulation is likely not the best option for her. She expresses a fear of falling and is quite unsteady on her feet. This is confirmed in PT report. Will discuss further with pt and family today.        Tysha Grismore  8/22/20148:03 AM

## 2013-05-23 NOTE — Progress Notes (Signed)
Pt HR up to 140-150 on monitor. Pt in room coughing multiple times to "bring up mucous" pt told to try to relax and pt HR dropped to 100-120. Pt BP 108/60. Will continue to monitor Baron Hamper, RN

## 2013-05-23 NOTE — Progress Notes (Signed)
SATURATION QUALIFICATIONS: (This note is used to comply with regulatory documentation for home oxygen)  Patient Saturations on Room Air at Rest = 94%  Patient Saturations on Room Air while Ambulating = 92%  Patient Saturations on 0 Liters of oxygen while Ambulating = Room Air  Please briefly explain why patient needs home oxygen: Pt spo2 dropped to 87% briefly while ambulating. Stopped, took deep breaths and recovered to 94% on room air.

## 2013-05-24 DIAGNOSIS — F0390 Unspecified dementia without behavioral disturbance: Secondary | ICD-10-CM

## 2013-05-24 MED ORDER — GUAIFENESIN ER 600 MG PO TB12
1200.0000 mg | ORAL_TABLET | Freq: Two times a day (BID) | ORAL | Status: DC
  Administered 2013-05-24 – 2013-05-29 (×10): 1200 mg via ORAL
  Filled 2013-05-24 (×11): qty 2

## 2013-05-24 MED ORDER — DIGOXIN 125 MCG PO TABS
0.1250 mg | ORAL_TABLET | Freq: Every day | ORAL | Status: DC
  Administered 2013-05-24 – 2013-05-29 (×5): 0.125 mg via ORAL
  Filled 2013-05-24 (×6): qty 1

## 2013-05-24 NOTE — Progress Notes (Signed)
pts family member informed RN pt had c/o c/p, RN spoke with pt and pt stated she "just feels more aware of my chest" pt denies c/p or discomfort, VSS, MD notified, will continue to monitor

## 2013-05-24 NOTE — Progress Notes (Signed)
Pt alert/forgetful, pt oob to chair with assist x 1 and walker, pt inc at times, no c/o pain, daughter at bedside, pt is tachy with exertions MDs aware, abx as ordered, will continue to monitor

## 2013-05-24 NOTE — Progress Notes (Signed)
SUBJECTIVE:  No compliants  OBJECTIVE:   Vitals:   Filed Vitals:   05/23/13 1937 05/23/13 2039 05/24/13 0440 05/24/13 0642  BP: 108/60 88/60 102/67 100/70  Pulse: 114 112 102   Temp:  97.8 F (36.6 C) 97.2 F (36.2 C)   TempSrc:  Oral Oral   Resp:  22 20   Height:      Weight:   51.5 kg (113 lb 8.6 oz)   SpO2:  95% 95%    I&O's:   Intake/Output Summary (Last 24 hours) at 05/24/13 1135 Last data filed at 05/23/13 2244  Gross per 24 hour  Intake   1077 ml  Output      0 ml  Net   1077 ml   TELEMETRY: Reviewed telemetry pt in  Atrial fibrillation with HR 105-120bpm     PHYSICAL EXAM General: Well developed, well nourished, in no acute distress Head: Eyes PERRLA, No xanthomas.   Normal cephalic and atramatic  Lungs:   Clear bilaterally to auscultation and percussion. Heart:   Irregularly irregular tachy S1 S2 Pulses are 2+ & equal.  Abdomen: Bowel sounds are positive, abdomen soft and non-tender without masses Extremities:   No clubbing, cyanosis or edema.  DP +1 Neuro: Alert and oriented X 3. Psych:  Good affect, responds appropriately   LABS: Basic Metabolic Panel:  Recent Labs  40/98/11 0545 05/22/13 2030 05/23/13 0500 05/24/13 0405  NA 132*  --  130*  --   K 3.7  --  3.8  --   CL 91*  --  89*  --   CO2 33*  --  32  --   GLUCOSE 99  --  89  --   BUN 21  --  29*  --   CREATININE 0.77 0.91 0.90  --   CALCIUM 9.0  --  8.9  --   MG 1.9  --  1.9 1.8   Liver Function Tests: No results found for this basename: AST, ALT, ALKPHOS, BILITOT, PROT, ALBUMIN,  in the last 72 hours No results found for this basename: LIPASE, AMYLASE,  in the last 72 hours CBC:  Recent Labs  05/22/13 2030  WBC 9.4  HGB 12.2  HCT 35.4*  MCV 86.3  PLT 357   Fasting Lipid Panel:  Recent Labs  05/23/13 0500  CHOL 126  HDL 57  LDLCALC 58  TRIG 57  CHOLHDL 2.2   Thyroid Function Tests: No results found for this basename: TSH, T4TOTAL, FREET3, T3FREE, THYROIDAB,  in the  last 72 hours Anemia Panel: No results found for this basename: VITAMINB12, FOLATE, FERRITIN, TIBC, IRON, RETICCTPCT,  in the last 72 hours Coag Panel:   Lab Results  Component Value Date   INR 1.03 01/20/2013    RADIOLOGY: Dg Chest 2 View  05/23/2013   *RADIOLOGY REPORT*  Clinical Data: Bilateral pleural effusions.  CHEST - 2 VIEW  Comparison: 05/21/2013 and 05/20/2013  Findings: Two views of the chest demonstrate bilateral pleural effusions, right side greater than left.  Right pleural effusion is small-moderate in size.  Heart size is stable and within normal limits.  Thoracic aorta is heavily calcified.  There is fluid within the right minor fissure.  Probable atelectasis or consolidation along the medial right lung base.  Few linear densities in the right lower lung are suggestive for atelectasis. There are old right rib fractures.  No evidence for a pneumothorax.  IMPRESSION: Bilateral pleural effusions, right side greater than left. The pleural effusions have minimally  changed since 05/20/2013.   Original Report Authenticated By: Richarda Overlie, M.D.   Dg Chest Port 1 View  05/21/2013   *RADIOLOGY REPORT*  Clinical Data: Congestive heart failure  PORTABLE CHEST - 1 VIEW  Comparison: 05/20/2013  Findings: Cardiac enlargement.  Pulmonary vascular congestion with mild edema.  Progression of right pleural effusion.  Small left effusion unchanged.  Bibasilar atelectasis has progressed. Subacute and chronic right rib fractures noted.  IMPRESSION: Progression of congestive heart failure.  Progression of right pleural effusion.   Original Report Authenticated By: Janeece Riggers, M.D.   Dg Chest Portable 1 View  05/20/2013   *RADIOLOGY REPORT*  Clinical Data: Chest pain and short of breath  PORTABLE CHEST - 1 VIEW  Comparison: 01/20/2013  Findings: Bilateral pleural effusions have developed since the prior study.  There is a moderately large effusion on the right and a small effusion on the left.  There are  multiple healing right rib fractures. Acute right rib fractures were noted on the prior study. There also are chronic right rib fractures.  No pneumothorax.  There is compressive atelectasis in the right lower lobe.  Negative for heart failure or edema.  IMPRESSION: Moderately large right pleural effusion with right lower lobe atelectasis.  Small left effusion.  Negative for heart failure.  Healing right rib fractures.   Original Report Authenticated By: Janeece Riggers, M.D.    ASSESSMENT AND PLAN:  1. Non-ischemic cardiomyopathy: LVEF 35% with diffuse HK, mild LVH. Cath  with moderately severe proximal to mid LAD stenosis. The plan will be for conservative treatment at this time unless she has refractory angina. Will continue beta blocker as tolerated with hypotension but will hold Lisinopril secondary to hypotension.  2. Acute systolic CHF: I/O inaccurate. Weight down 13 lbs since admission. She has been hypotensive for last 48 hours. Would continue to hold Lasix today. Volume status better than on admission.  3. CAD: As above, pt with moderately severe proximal to mid LAD stenosis and mild disease in the RCA. Will attempt medical management for now with ASA, statin, beta blocker. If she has refractory angina, could pursue PCI of the LAD. Continue statin  4. Atrial fibrillation: Back in atrial fib with HR 105-120bpm. Continue beta blocker. Not a good candidate for amiodarone with abnormal thyroid function tests. Long term anti-coagulation is likely not the best option for her. She expresses a fear of falling and is quite unsteady on her feet. This is confirmed in PT report. I will add digoxin in hopes of controlling HR better especially in the setting of reduced LVF     Quintella Reichert, MD  05/24/2013  11:35 AM

## 2013-05-24 NOTE — Evaluation (Signed)
Occupational Therapy Evaluation Patient Details Name: [name] MRN: [id number] DOB: 1928-08-07 Today's Date: 05/24/2013 Time: 3086-5784 OT Time Calculation (min): 18 min  OT Assessment / Plan / Recommendation History of present illness 79 y.o. female admitted to [facility] on 05/20/13 with PMHx hypertension, dementia, A. fib with RVR presents with her family members from her doctor's office after she was found to be in atrial fibrillation. The family reports that the patient has had increased shortness of breath and has been rubbing her chest and leaning over for the last several days. This is relatively persistent, she has associated swelling in her lower extremities.  Pt diagnosed with new onset CHF.     Clinical Impression   Pt admitted with A-Fib with RVR and h/o demential.  Pt demonstrates impaired balance which is worsened by her fear of falling resulting in poor safety awareness.  Currently, she is requiring min-mod A for BADLs.  Spoke at length with dtr over the phone re: pt's status.  At this time, dtr wants pt to go home with 24 hour assistance.  Recommend 16" wheelchair for those times when she is fatigued and not moving as well.  Currently, pt has only walked to bathroom and back.  Also recommend BSC.  Feel she will benefit from continued OT to maximize safety and independence with BADLs and to address the below problem list    OT Assessment  Patient needs continued OT Services    Follow Up Recommendations  Home health OT;Supervision/Assistance - 24 hour ; [facility]   Barriers to Discharge      Equipment Recommendations  3 in 1 bedside comode;Wheelchair (measurements OT);Wheelchair cushion (measurements OT) (16" lightweight)    Recommendations for Other Services    Frequency  Min 2X/week    Precautions / Restrictions Precautions Precautions: Fall Restrictions Weight Bearing Restrictions: No   Pertinent Vitals/Pain     ADL  Eating/Feeding: Set up Where Assessed -  Eating/Feeding: Chair Grooming: Wash/dry hands;Wash/dry face;Denture care;Supervision/safety;Set up Where Assessed - Grooming: Supported sitting Upper Body Bathing: Supervision/safety Where Assessed - Upper Body Bathing: Supported sitting Lower Body Bathing: Minimal assistance Where Assessed - Lower Body Bathing: Supported sit to stand Upper Body Dressing: Supervision/safety Where Assessed - Upper Body Dressing: Unsupported sitting Lower Body Dressing: Moderate assistance Where Assessed - Lower Body Dressing: Supported sit to Pharmacist, hospital: Moderate assistance Toilet Transfer Method: Sit to stand;Stand pivot (ambulating) Toilet Transfer Equipment: Comfort height toilet;Raised toilet seat with arms (or 3-in-1 over toilet) Toileting - Clothing Manipulation and Hygiene: Moderate assistance Where Assessed - Toileting Clothing Manipulation and Hygiene: Standing Equipment Used: Rolling walker Transfers/Ambulation Related to ADLs: Pt ambulated with min A with RW, occasional mod A as she gets nervous and throws her hands up stating she is going to fall.  ADL Comments: Pt. able to don/doff socks with supervision sitting in chair.  requires assist for LB ADLs and functional mobility due to impaired balance and fear of falling.  Pt is fully aware that her fear is impeding progress and her reaction to it is irrationale, as she will say this once she has returned to a sitting position.  Phoned pt's dtr at end of session, and had a long conversation about current status, need for 24 hour assistance, how to safely assist pt, and options of SNF vs. Home with 24 hour assist and HH therapies.  At this time, dtr wants to try for home discharge    OT Diagnosis: Generalized weakness;Cognitive deficits  OT Problem List: Decreased strength;Impaired  balance (sitting and/or standing);Decreased cognition;Decreased safety awareness;Decreased knowledge of use of DME or AE;Cardiopulmonary status limiting  activity OT Treatment Interventions: Self-care/ADL training;DME and/or AE instruction;Therapeutic activities;Patient/family education;Balance training   OT Goals(Current goals can be found in the care plan section) Acute Rehab OT Goals Patient Stated Goal: to go home, avoid falling.   OT Goal Formulation: With patient/family Time For Goal Achievement: 05/31/13 Potential to Achieve Goals: Good ADL Goals Pt Will Perform Grooming: with min assist;standing Pt Will Perform Lower Body Bathing: with min assist;sit to/from stand Pt Will Perform Lower Body Dressing: with min assist;sit to/from stand Pt Will Transfer to Toilet: with min assist;ambulating;regular height toilet;grab bars Pt Will Perform Toileting - Clothing Manipulation and hygiene: with min assist;sit to/from stand Pt Will Perform Tub/Shower Transfer: Tub transfer;Shower transfer;tub bench;ambulating;rolling walker  Visit Information  Last OT Received On: 05/24/13 Assistance Needed: +1 History of Present Illness: 79 y.o. female admitted to [facility] on 05/20/13 with PMHx hypertension, dementia, A. fib with RVR presents with her family members from her doctor's office after she was found to be in atrial fibrillation. The family reports that the patient has had increased shortness of breath and has been rubbing her chest and leaning over for the last several days. This is relatively persistent, she has associated swelling in her lower extremities.  Pt diagnosed with new onset CHF.         Prior Functioning     Home Living Family/patient expects to be discharged to:: Private residence Living Arrangements: Alone Available Help at Discharge: Personal care attendant;Available PRN/intermittently (3 times a week, family checking on her daily as well) Type of Home: House Home Access: Level entry Home Layout: One level Home Equipment: Walker - 2 wheels;Shower seat - built in (elevated toilets) Additional Comments: Pt has some baseline  dementia, but was very functional at home in her normal environment.   (DOE 3/4) Prior Function Level of Independence: Needs assistance Gait / Transfers Assistance Needed: mod I with RW ADL's / Homemaking Assistance Needed: total assist with house cleaning and cooking, independent with basic dressing, assist for showers Communication / Swallowing Assistance Needed: none Comments: Daughter reports she is going to increase the help at home now.   Communication Communication: No difficulties Dominant Hand: Right         Vision/Perception     Cognition  Cognition Arousal/Alertness: Awake/alert Behavior During Therapy: Anxious Overall Cognitive Status: Impaired/Different from baseline Area of Impairment: Attention;Memory;Safety/judgement;Problem solving Current Attention Level: Selective Memory: Decreased short-term memory Safety/Judgement: Decreased awareness of safety Problem Solving: Slow processing;Requires verbal cues;Requires tactile cues General Comments: Pt with h/o dementia with confusion noted in hospital.  Dtr reports worsening of cognition with change of environment    Extremity/Trunk Assessment Upper Extremity Assessment Upper Extremity Assessment: Generalized weakness Lower Extremity Assessment Lower Extremity Assessment: Defer to PT evaluation Cervical / Trunk Assessment Cervical / Trunk Assessment: Kyphotic     Mobility Bed Mobility Bed Mobility: Not assessed Transfers Transfers: Sit to Stand;Stand to Sit Sit to Stand: 4: Min assist;With upper extremity assist;From chair/3-in-1 Stand to Sit: 4: Min assist;With upper extremity assist;To chair/3-in-1 Details for Transfer Assistance: Pt requires min A due to posterior lean.  max verbal cues for safety      Exercise     Balance Balance Balance Assessed: Yes Static Standing Balance Static Standing - Balance Support: Bilateral upper extremity supported Static Standing - Level of Assistance: 4: Min  assist Static Standing - Comment/# of Minutes: posterior lean   End of Session  OT - End of Session Equipment Utilized During Treatment: Rolling walker Activity Tolerance: Patient tolerated treatment well Patient left: in chair;with call bell/phone within reach Nurse Communication: Mobility status  GO     Mohsin Crum M 05/24/2013, 12:06 PM

## 2013-05-24 NOTE — Progress Notes (Signed)
[facility] PROGRESS NOTE  [name] ZOX:[id number] DOB: 02-24-1928 DOA: 05/20/2013 PCP: [name], DO  HPI/Subjective: Seen after she ate her breakfast, denies any complaints.  Assessment/Plan:  Acute systolic CHF -LVEF is 35% with diffuse hypokinesis. -Patient is on beta blockers, did not tolerate diuresis and lisinopril secondary to hypertension. -Cardiology is following, cardiac cath showed single-vessel obstructive disease involving the LAD. -Optimize beta blockade therapy, ACE inhibitor and diuretics if blood pressure allows. -Patient still has bilateral effusion and chest x-ray indicative of mild fluid overload/pulmonary edema.  Nonischemic cardiomyopathy -Cardiology is following, Cardiac cath showed LVEF of 35% with moderately severe proximal to mid LAD stenosis. -No much change of patient weight since admission. -Continue beta blockers, ACE inhibitors and diuretics as blood pressure allows, will let cards to decide about it.  Atrial fibrillation with RVR -Cardiology service following, recommended to continue beta blockers. -Recommendation for anticoagulation to follow, is on low dose aspirin  Hyponatremia -Asymptomatic hyponatremia, likely secondary to CHF and dilutional hyponatremia. -Check BMP in a.m.  Leukocytosis -This is treated empirically with antibiotics, patient is on Levaquin. -Possible consultation along the right lung base, this is treated as CAP.  Code Status: Full code Family Communication: Plan discussed with the patient. Disposition Plan: Remains inpatient, PT/OT to evaluate and treat.   Consultants:  Cardiology  Procedures:  Cardiac cath:  Final Conclusions:  1. Single vessel obstructive CAD involving the LAD.  2. Low normal LV systolic function. EF 50%.  3. Low LV filling pressures.  Recommendations: Hold IV diuretics. Optimize beta blocker therapy. If the patient has refractory chest pain despite optimal medical therapy with 2  antianginal agents she could be considered for PCI of the LAD. Otherwise I would treat her with conservative medical therapy.  Antibiotics:  Levaquin started on 8/21  Objective: Filed Vitals:   05/24/13 0642  BP: 100/70  Pulse:   Temp:   Resp:     Intake/Output Summary (Last 24 hours) at 05/24/13 1111 Last data filed at 05/23/13 2244  Gross per 24 hour  Intake   1077 ml  Output      0 ml  Net   1077 ml   Filed Weights   05/22/13 0638 05/23/13 0740 05/24/13 0440  Weight: 51.2 kg (112 lb 14 oz) 51.256 kg (113 lb) 51.5 kg (113 lb 8.6 oz)    Exam: General: Alert and awake, oriented x3, not in any acute distress. HEENT: anicteric sclera, pupils reactive to light and accommodation, EOMI CVS: S1-S2 clear, no murmur rubs or gallops Chest: clear to auscultation bilaterally, no wheezing, rales or rhonchi Abdomen: soft nontender, nondistended, normal bowel sounds, no organomegaly Extremities: no cyanosis, clubbing or edema noted bilaterally Neuro: Cranial nerves II-XII intact, no focal neurological deficits  Data Reviewed: Basic Metabolic Panel:  Recent Labs Lab 05/20/13 1438 05/20/13 1802 05/21/13 0615 05/22/13 0545 05/22/13 2030 05/23/13 0500 05/24/13 0405  NA 131*  --  131* 132*  --  130*  --   K 3.6  --  2.8* 3.7  --  3.8  --   CL 92*  --  89* 91*  --  89*  --   CO2 28  --  32 33*  --  32  --   GLUCOSE 105*  --  95 99  --  89  --   BUN 20  --  19 21  --  29*  --   CREATININE 0.55 0.54 0.72 0.77 0.91 0.90  --   CALCIUM 9.1  --  8.7 9.0  --  8.9  --   MG  --   --  1.8 1.9  --  1.9 1.8   Liver Function Tests:  Recent Labs Lab 05/20/13 1438 05/21/13 0615  AST 24 21  ALT 27 23  ALKPHOS 59 54  BILITOT 0.4 0.6  PROT 5.9* 5.5*  ALBUMIN 3.0* 2.7*   No results found for this basename: LIPASE, AMYLASE,  in the last 168 hours No results found for this basename: AMMONIA,  in the last 168 hours CBC:  Recent Labs Lab 05/20/13 1438 05/20/13 1802 05/21/13 0615  05/22/13 2030  WBC 13.4* 12.7* 11.6* 9.4  NEUTROABS  --   --  8.7*  --   HGB 12.7 12.6 11.7* 12.2  HCT 36.1 36.2 33.8* 35.4*  MCV 85.1 85.2 84.3 86.3  PLT 414* 399 359 357   Cardiac Enzymes:  Recent Labs Lab 05/20/13 1804 05/20/13 2325 05/21/13 0615  TROPONINI <0.30 <0.30 <0.30   BNP (last 3 results)  Recent Labs  05/20/13 1438 05/20/13 1804  PROBNP 7162.0* 6451.0*   CBG: No results found for this basename: GLUCAP,  in the last 168 hours  Micro Recent Results (from the past 240 hour(s))  CULTURE, BLOOD (ROUTINE X 2)     Status: None   Collection Time    05/20/13  5:30 PM      Result Value Range Status   Specimen Description BLOOD RIGHT ANTECUBITAL   Final   Special Requests BOTTLES DRAWN AEROBIC AND ANAEROBIC 10CC   Final   Culture  Setup Time     Final   Value: 05/21/2013 01:29     Performed at [facility]   Culture     Final   Value:        BLOOD CULTURE RECEIVED NO GROWTH TO DATE CULTURE WILL BE HELD FOR 5 DAYS BEFORE ISSUING A FINAL NEGATIVE REPORT     Performed at [facility]   Report Status PENDING   Incomplete  CULTURE, BLOOD (ROUTINE X 2)     Status: None   Collection Time    05/20/13  5:45 PM      Result Value Range Status   Specimen Description BLOOD LEFT ANTECUBITAL   Final   Special Requests BOTTLES DRAWN AEROBIC AND ANAEROBIC 10CC   Final   Culture  Setup Time     Final   Value: 05/21/2013 02:12     Performed at [facility]   Culture     Final   Value:        BLOOD CULTURE RECEIVED NO GROWTH TO DATE CULTURE WILL BE HELD FOR 5 DAYS BEFORE ISSUING A FINAL NEGATIVE REPORT     Performed at [facility]   Report Status PENDING   Incomplete  URINE CULTURE     Status: None   Collection Time    05/21/13  2:30 AM      Result Value Range Status   Specimen Description URINE, CLEAN CATCH   Final   Special Requests NONE   Final   Culture  Setup Time     Final   Value: 05/21/2013 05:30     Performed at [facility] Count     Final   Value: NO GROWTH     Performed at [facility]   Culture     Final   Value: NO GROWTH     Performed at [facility]   Report Status  05/22/2013 FINAL   Final     Studies: Dg Chest 2 View  05/23/2013   *RADIOLOGY REPORT*  Clinical Data: Bilateral pleural effusions.  CHEST - 2 VIEW  Comparison: 05/21/2013 and 05/20/2013  Findings: Two views of the chest demonstrate bilateral pleural effusions, right side greater than left.  Right pleural effusion is small-moderate in size.  Heart size is stable and within normal limits.  Thoracic aorta is heavily calcified.  There is fluid within the right minor fissure.  Probable atelectasis or consolidation along the medial right lung base.  Few linear densities in the right lower lung are suggestive for atelectasis. There are old right rib fractures.  No evidence for a pneumothorax.  IMPRESSION: Bilateral pleural effusions, right side greater than left. The pleural effusions have minimally changed since 05/20/2013.   Original Report Authenticated By: Richarda Overlie, M.D.    Scheduled Meds: . aspirin EC  81 mg Oral Daily  . atorvastatin  20 mg Oral q1800  . carvedilol  3.125 mg Oral BID WC  . donepezil  5 mg Oral QHS  . feeding supplement  237 mL Oral Q24H  . heparin  5,000 Units Subcutaneous Q8H  . levofloxacin (LEVAQUIN) IV  750 mg Intravenous Q48H  . sodium chloride  3 mL Intravenous Q12H   Continuous Infusions:   Principal Problem:   Congestive heart failure, unspecified Active Problems:   HYPERTENSION   Atrial fibrillation with RVR   Dementia with behavioral disturbance   Leukocytosis, unspecified   Pleural effusion   Hyponatremia   Coronary atherosclerosis of native coronary artery   Non-ischemic cardiomyopathy    Time spent: 35 minutes    [facility] A  [facility] Pager [phone] If 7PM-7AM, please contact night-coverage at [email or web address], password [facility] 05/24/2013, 11:11 AM   LOS: 4 days

## 2013-05-25 LAB — CBC
HCT: 35.9 % — ABNORMAL LOW (ref 36.0–46.0)
Hemoglobin: 12.4 g/dL (ref 12.0–15.0)
MCH: 29.1 pg (ref 26.0–34.0)
MCHC: 34.5 g/dL (ref 30.0–36.0)
RDW: 13.6 % (ref 11.5–15.5)

## 2013-05-25 LAB — BASIC METABOLIC PANEL
BUN: 21 mg/dL (ref 6–23)
Creatinine, Ser: 0.63 mg/dL (ref 0.50–1.10)
GFR calc non Af Amer: 81 mL/min — ABNORMAL LOW (ref >90)
Glucose, Bld: 97 mg/dL (ref 70–99)
Potassium: 3.4 mEq/L — ABNORMAL LOW (ref 3.5–5.1)

## 2013-05-25 MED ORDER — FUROSEMIDE 40 MG PO TABS
40.0000 mg | ORAL_TABLET | Freq: Two times a day (BID) | ORAL | Status: DC
  Administered 2013-05-25 – 2013-05-26 (×2): 40 mg via ORAL
  Filled 2013-05-25 (×5): qty 1

## 2013-05-25 MED ORDER — POTASSIUM CHLORIDE CRYS ER 20 MEQ PO TBCR
60.0000 meq | EXTENDED_RELEASE_TABLET | Freq: Once | ORAL | Status: AC
  Administered 2013-05-25: 60 meq via ORAL

## 2013-05-25 MED ORDER — FUROSEMIDE 10 MG/ML IJ SOLN: 40.0000 mg | Freq: Two times a day (BID) | INTRAMUSCULAR | Status: DC

## 2013-05-25 NOTE — Progress Notes (Signed)
Physical Therapy Treatment Patient Details Name: [name] MRN: [id number] DOB: [birth year] Today's Date: 05/25/2013 Time: 4098-1191 PT Time Calculation (min): 39 min  PT Assessment / Plan / Recommendation  History of Present Illness 78 y.o. female admitted to [facility] on 05/20/13 with PMHx hypertension, dementia, A. fib with RVR presents with her family members from her doctor's office after she was found to be in atrial fibrillation. The family reports that the patient has had increased shortness of breath and has been rubbing her chest and leaning over for the last several days. This is relatively persistent, she has associated swelling in her lower extremities.  Pt diagnosed with new onset CHF.     PT Comments   Pt continues to be fearful of falling but with motivation pt willing to ambulate.  Pt with improved confidence with use of tennis shoes and chair to follow.  Pt's fear of falling continues to make pt even more of increase risk of falls.   Follow Up Recommendations  Home health PT;Supervision/Assistance - 24 hour     Equipment Recommendations  Wheelchair (measurements PT);Wheelchair cushion (measurements PT)    Frequency Min 3X/week   Progress towards PT Goals Progress towards PT goals: Progressing toward goals  Plan Current plan remains appropriate    Precautions / Restrictions Precautions Precautions: Fall Precaution Comments: monitor O2 sats with mobility, pt is VERY fearful of falling.   Restrictions Weight Bearing Restrictions: No   Pertinent Vitals/Pain Arthritic pain   Mobility  Bed Mobility Bed Mobility: Not assessed Transfers Transfers: Sit to Stand;Stand to Sit Sit to Stand: 4: Min assist;With upper extremity assist;From chair/3-in-1 Stand to Sit: 4: Min assist;With upper extremity assist;To chair/3-in-1 Details for Transfer Assistance: (A) to for forward translation and initiate transfer Ambulation/Gait Ambulation/Gait Assistance: 3: Mod assist;4: Min  assist Ambulation Distance (Feet): 150 Feet (100'+50' with seated rest break) Assistive device: Rolling walker Ambulation/Gait Assistance Details: (A) to maintain balance due to increase lateral sway with very wide BOS due to knee valgus.  Manual cues for hand placement slightly below rib cage to give pt increased confidence during ambulation. Gait Pattern: Step-through pattern;Decreased stride length;Shuffle;Trunk flexed;Wide base of support;Decreased trunk rotation Stairs: No    Exercises General Exercises - Lower Extremity Long Arc Quad: AAROM;Strengthening;Both;10 reps;Seated Hip ABduction/ADduction: Strengthening;Both;10 reps;Seated Straight Leg Raises: Strengthening;Both;10 reps;Seated Hip Flexion/Marching: Strengthening;Both;10 reps;Seated   PT Diagnosis:    PT Problem List:   PT Treatment Interventions:     PT Goals (current goals can now be found in the care plan section) Acute Rehab PT Goals Patient Stated Goal: to go home, avoid falling.   PT Goal Formulation: With patient Time For Goal Achievement: 06/05/13 Potential to Achieve Goals: Good  Visit Information  Last PT Received On: 05/25/13 Assistance Needed: +1 History of Present Illness: 79 y.o. female admitted to [facility] on 05/20/13 with PMHx hypertension, dementia, A. fib with RVR presents with her family members from her doctor's office after she was found to be in atrial fibrillation. The family reports that the patient has had increased shortness of breath and has been rubbing her chest and leaning over for the last several days. This is relatively persistent, she has associated swelling in her lower extremities.  Pt diagnosed with new onset CHF.      Subjective Data  Subjective: "Do I have to walk? I'm so afriad" Patient Stated Goal: to go home, avoid falling.     Cognition  Cognition Arousal/Alertness: Awake/alert Behavior During Therapy: Anxious Overall Cognitive Status: Impaired/Different from  baseline Area of  Impairment: Attention;Memory;Safety/judgement;Problem solving Current Attention Level: Selective Memory: Decreased short-term memory Safety/Judgement: Decreased awareness of safety Problem Solving: Slow processing;Requires verbal cues;Requires tactile cues General Comments: Pt with h/o dementia with confusion noted in hospital.  Dtr reports worsening of cognition with change of environment    Balance     End of Session PT - End of Session Equipment Utilized During Treatment: Gait belt Activity Tolerance: Patient tolerated treatment well;Other (comment) (Continues to be fearful of falling and needs max motivation ) Patient left: in chair;with call bell/phone within reach;with family/visitor present Nurse Communication: Mobility status;Other (comment)   GP     Mahina Salatino 05/25/2013, 12:38 PM Jake Shark, PT DPT [phone]

## 2013-05-25 NOTE — Progress Notes (Signed)
Patient confused at times. Sleeping intermittently.

## 2013-05-25 NOTE — Progress Notes (Signed)
SUBJECTIVE:  Up walking in the hall with no SOB  OBJECTIVE:   Vitals:   Filed Vitals:   05/24/13 1500 05/24/13 2048 05/25/13 0435 05/25/13 0500  BP: 93/59 100/66 119/98 118/60  Pulse: 113 64 75 68  Temp:  97.7 F (36.5 C) 97.5 F (36.4 C)   TempSrc:  Oral Oral   Resp:  20 20   Height:      Weight:   51.9 kg (114 lb 6.7 oz)   SpO2:  94% 91%    I&O's:   Intake/Output Summary (Last 24 hours) at 05/25/13 1131 Last data filed at 05/24/13 1900  Gross per 24 hour  Intake    240 ml  Output    201 ml  Net     39 ml   TELEMETRY: Reviewed telemetry pt in atrial fibrillation:     PHYSICAL EXAM General: Well developed, well nourished, in no acute distress Head: Eyes PERRLA, No xanthomas.   Normal cephalic and atramatic  Lungs:   Clear bilaterally to auscultation and percussion. Heart:   irreglularly irregularS1 S2 Pulses are 2+ & equal. Abdomen: Bowel sounds are positive, abdomen soft and non-tender without masses  Extremities:   No clubbing, cyanosis or edema.  DP +1 Neuro: Alert and oriented X 3. Psych:  Good affect, responds appropriately   LABS: Basic Metabolic Panel:  Recent Labs  14/78/29 0500 05/24/13 0405 05/25/13 0445  NA 130*  --  124*  K 3.8  --  3.4*  CL 89*  --  86*  CO2 32  --  26  GLUCOSE 89  --  97  BUN 29*  --  21  CREATININE 0.90  --  0.63  CALCIUM 8.9  --  8.6  MG 1.9 1.8  --    Liver Function Tests: No results found for this basename: AST, ALT, ALKPHOS, BILITOT, PROT, ALBUMIN,  in the last 72 hours No results found for this basename: LIPASE, AMYLASE,  in the last 72 hours CBC:  Recent Labs  05/22/13 2030 05/25/13 0445  WBC 9.4 12.0*  HGB 12.2 12.4  HCT 35.4* 35.9*  MCV 86.3 84.3  PLT 357 367   Fasting Lipid Panel:  Recent Labs  05/23/13 0500  CHOL 126  HDL 57  LDLCALC 58  TRIG 57  CHOLHDL 2.2   Thyroid Function Tests: No results found for this basename: TSH, T4TOTAL, FREET3, T3FREE, THYROIDAB,  in the last 72 hours Anemia  Panel: No results found for this basename: VITAMINB12, FOLATE, FERRITIN, TIBC, IRON, RETICCTPCT,  in the last 72 hours Coag Panel:   Lab Results  Component Value Date   INR 1.03 01/20/2013    RADIOLOGY: Dg Chest 2 View  05/23/2013   *RADIOLOGY REPORT*  Clinical Data: Bilateral pleural effusions.  CHEST - 2 VIEW  Comparison: 05/21/2013 and 05/20/2013  Findings: Two views of the chest demonstrate bilateral pleural effusions, right side greater than left.  Right pleural effusion is small-moderate in size.  Heart size is stable and within normal limits.  Thoracic aorta is heavily calcified.  There is fluid within the right minor fissure.  Probable atelectasis or consolidation along the medial right lung base.  Few linear densities in the right lower lung are suggestive for atelectasis. There are old right rib fractures.  No evidence for a pneumothorax.  IMPRESSION: Bilateral pleural effusions, right side greater than left. The pleural effusions have minimally changed since 05/20/2013.   Original Report Authenticated By: Richarda Overlie, M.D.   Dg Chest [facility]  1 View  05/21/2013   *RADIOLOGY REPORT*  Clinical Data: Congestive heart failure  PORTABLE CHEST - 1 VIEW  Comparison: 05/20/2013  Findings: Cardiac enlargement.  Pulmonary vascular congestion with mild edema.  Progression of right pleural effusion.  Small left effusion unchanged.  Bibasilar atelectasis has progressed. Subacute and chronic right rib fractures noted.  IMPRESSION: Progression of congestive heart failure.  Progression of right pleural effusion.   Original Report Authenticated By: Janeece Riggers, M.D.   Dg Chest Portable 1 View  05/20/2013   *RADIOLOGY REPORT*  Clinical Data: Chest pain and short of breath  PORTABLE CHEST - 1 VIEW  Comparison: 01/20/2013  Findings: Bilateral pleural effusions have developed since the prior study.  There is a moderately large effusion on the right and a small effusion on the left.  There are multiple healing right  rib fractures. Acute right rib fractures were noted on the prior study. There also are chronic right rib fractures.  No pneumothorax.  There is compressive atelectasis in the right lower lobe.  Negative for heart failure or edema.  IMPRESSION: Moderately large right pleural effusion with right lower lobe atelectasis.  Small left effusion.  Negative for heart failure.  Healing right rib fractures.   Original Report Authenticated By: Janeece Riggers, M.D.    ASSESSMENT AND PLAN:  1. Non-ischemic cardiomyopathy: LVEF 35% with diffuse HK, mild LVH. Cath with moderately severe proximal to mid LAD stenosis. The plan will be for conservative treatment at this time unless she has refractory angina. Will continue beta blocker as tolerated with hypotension but will hold Lisinopril secondary to borderline hypotension.  2. Acute systolic CHF: I/O inaccurate. Weight down 13 lbs since admission.3.  Lasix restarted today but would change to PO since she appears euvolemic on exam. 3. CAD: As above, pt with moderately severe proximal to mid LAD stenosis and mild disease in the RCA. Will attempt medical management for now with ASA, statin, beta blocker. If she has refractory angina, could pursue PCI of the LAD. Continue statin  4. Atrial fibrillation: Back in atrial fib with HR 100bpm with ambulation and in the 60's at rest. Her HR is much improved after adding digoxin.  Continue beta blocker and dig.  Not a good candidate for amiodarone with abnormal thyroid function tests. Long term anti-coagulation is likely not the best option for her. She expresses a fear of falling and is quite unsteady on her feet. This is confirmed in PT report.       Quintella Reichert, MD  05/25/2013  11:31 AM

## 2013-05-25 NOTE — Progress Notes (Signed)
[facility] PROGRESS NOTE  [name] YNW:[id number] DOB: [birth year] DOA: 05/20/2013 PCP: [name], DO  HPI/Subjective: Had some shortness of breath last night, as well as productive cough. Off of Lasix since the 21st because of low blood pressure.  Assessment/Plan:  Acute systolic CHF -LVEF is 35% with diffuse hypokinesis. -Patient is on beta blockers, did not tolerate diuresis and lisinopril secondary to hypotension. -Cardiology is following, cardiac cath showed single-vessel obstructive disease involving the LAD. -Optimize beta blockade therapy, ACE inhibitor and diuretics if blood pressure allows. -Patient still has bilateral effusion and chest x-ray indicative of mild fluid overload/pulmonary edema. -I will restart Lasix today.  Nonischemic cardiomyopathy -Cardiology is following, Cardiac cath showed LVEF of 35% with moderately severe proximal to mid LAD stenosis. -No much change of patient weight since admission. -Continue beta blockers, ACE inhibitors and diuretics as blood pressure allows, will let cards to decide about it.  Atrial fibrillation with RVR -Cardiology service following, recommended to continue beta blockers. -Recommendation for anticoagulation to follow, is on low dose aspirin  Hyponatremia -Asymptomatic hyponatremia, likely secondary to CHF and dilutional hyponatremia. -Check BMP in a.m. after Lasix restarted.   Possible pneumonia  -This is treated empirically with antibiotics, patient is on Levaquin and Mucinex. -Possible infiltrates along the right lung base, this is treated as CAP.  Code Status: Full code Family Communication: Plan discussed with the patient. Disposition Plan: Remains inpatient, PT/OT to evaluate and treat.   Consultants:  Cardiology  Procedures:  Cardiac cath:  Final Conclusions:  1. Single vessel obstructive CAD involving the LAD.  2. Low normal LV systolic function. EF 50%.  3. Low LV filling pressures.   Recommendations: Hold IV diuretics. Optimize beta blocker therapy. If the patient has refractory chest pain despite optimal medical therapy with 2 antianginal agents she could be considered for PCI of the LAD. Otherwise I would treat her with conservative medical therapy.  Antibiotics:  Levaquin started on 8/21  Objective: Filed Vitals:   05/25/13 0500  BP: 118/60  Pulse: 68  Temp:   Resp:     Intake/Output Summary (Last 24 hours) at 05/25/13 1055 Last data filed at 05/24/13 1900  Gross per 24 hour  Intake    240 ml  Output    201 ml  Net     39 ml   Filed Weights   05/23/13 0740 05/24/13 0440 05/25/13 0435  Weight: 51.256 kg (113 lb) 51.5 kg (113 lb 8.6 oz) 51.9 kg (114 lb 6.7 oz)    Exam: General: Alert and awake, oriented x3, not in any acute distress. HEENT: anicteric sclera, pupils reactive to light and accommodation, EOMI CVS: S1-S2 clear, no murmur rubs or gallops Chest: clear to auscultation bilaterally, no wheezing, rales or rhonchi Abdomen: soft nontender, nondistended, normal bowel sounds, no organomegaly Extremities: no cyanosis, clubbing or edema noted bilaterally Neuro: Cranial nerves II-XII intact, no focal neurological deficits  Data Reviewed: Basic Metabolic Panel:  Recent Labs Lab 05/20/13 1438  05/21/13 0615 05/22/13 0545 05/22/13 2030 05/23/13 0500 05/24/13 0405 05/25/13 0445  NA 131*  --  131* 132*  --  130*  --  124*  K 3.6  --  2.8* 3.7  --  3.8  --  3.4*  CL 92*  --  89* 91*  --  89*  --  86*  CO2 28  --  32 33*  --  32  --  26  GLUCOSE 105*  --  95 99  --  89  --  97  BUN 20  --  19 21  --  29*  --  21  CREATININE 0.55  < > 0.72 0.77 0.91 0.90  --  0.63  CALCIUM 9.1  --  8.7 9.0  --  8.9  --  8.6  MG  --   --  1.8 1.9  --  1.9 1.8  --   < > = values in this interval not displayed. Liver Function Tests:  Recent Labs Lab 05/20/13 1438 05/21/13 0615  AST 24 21  ALT 27 23  ALKPHOS 59 54  BILITOT 0.4 0.6  PROT 5.9* 5.5*   ALBUMIN 3.0* 2.7*   No results found for this basename: LIPASE, AMYLASE,  in the last 168 hours No results found for this basename: AMMONIA,  in the last 168 hours CBC:  Recent Labs Lab 05/20/13 1438 05/20/13 1802 05/21/13 0615 05/22/13 2030 05/25/13 0445  WBC 13.4* 12.7* 11.6* 9.4 12.0*  NEUTROABS  --   --  8.7*  --   --   HGB 12.7 12.6 11.7* 12.2 12.4  HCT 36.1 36.2 33.8* 35.4* 35.9*  MCV 85.1 85.2 84.3 86.3 84.3  PLT 414* 399 359 357 367   Cardiac Enzymes:  Recent Labs Lab 05/20/13 1804 05/20/13 2325 05/21/13 0615  TROPONINI <0.30 <0.30 <0.30   BNP (last 3 results)  Recent Labs  05/20/13 1438 05/20/13 1804  PROBNP 7162.0* 6451.0*   CBG: No results found for this basename: GLUCAP,  in the last 168 hours  Micro Recent Results (from the past 240 hour(s))  CULTURE, BLOOD (ROUTINE X 2)     Status: None   Collection Time    05/20/13  5:30 PM      Result Value Range Status   Specimen Description BLOOD RIGHT ANTECUBITAL   Final   Special Requests BOTTLES DRAWN AEROBIC AND ANAEROBIC 10CC   Final   Culture  Setup Time     Final   Value: 05/21/2013 01:29     Performed at [facility]   Culture     Final   Value:        BLOOD CULTURE RECEIVED NO GROWTH TO DATE CULTURE WILL BE HELD FOR 5 DAYS BEFORE ISSUING A FINAL NEGATIVE REPORT     Performed at [facility]   Report Status PENDING   Incomplete  CULTURE, BLOOD (ROUTINE X 2)     Status: None   Collection Time    05/20/13  5:45 PM      Result Value Range Status   Specimen Description BLOOD LEFT ANTECUBITAL   Final   Special Requests BOTTLES DRAWN AEROBIC AND ANAEROBIC 10CC   Final   Culture  Setup Time     Final   Value: 05/21/2013 02:12     Performed at [facility]   Culture     Final   Value:        BLOOD CULTURE RECEIVED NO GROWTH TO DATE CULTURE WILL BE HELD FOR 5 DAYS BEFORE ISSUING A FINAL NEGATIVE REPORT     Performed at [facility]   Report Status PENDING    Incomplete  URINE CULTURE     Status: None   Collection Time    05/21/13  2:30 AM      Result Value Range Status   Specimen Description URINE, CLEAN CATCH   Final   Special Requests NONE   Final   Culture  Setup Time     Final   Value: 05/21/2013  05:30     Performed at [facility] Count     Final   Value: NO GROWTH     Performed at [facility]   Culture     Final   Value: NO GROWTH     Performed at [facility]   Report Status 05/22/2013 FINAL   Final     Studies: No results found.  Scheduled Meds: . aspirin EC  81 mg Oral Daily  . atorvastatin  20 mg Oral q1800  . carvedilol  3.125 mg Oral BID WC  . digoxin  0.125 mg Oral Daily  . donepezil  5 mg Oral QHS  . feeding supplement  237 mL Oral Q24H  . guaiFENesin  1,200 mg Oral BID  . heparin  5,000 Units Subcutaneous Q8H  . levofloxacin (LEVAQUIN) IV  750 mg Intravenous Q48H  . sodium chloride  3 mL Intravenous Q12H   Continuous Infusions:   Principal Problem:   Congestive heart failure, unspecified Active Problems:   HYPERTENSION   Atrial fibrillation with RVR   Dementia with behavioral disturbance   Leukocytosis, unspecified   Pleural effusion   Hyponatremia   Coronary atherosclerosis of native coronary artery   Non-ischemic cardiomyopathy    Time spent: 35 minutes    [facility]  [facility] Pager [phone] If 7PM-7AM, please contact night-coverage at [email or web address], password [facility] 05/25/2013, 10:55 AM  LOS: 5 days

## 2013-05-26 LAB — BASIC METABOLIC PANEL
Calcium: 8.3 mg/dL — ABNORMAL LOW (ref 8.4–10.5)
GFR calc Af Amer: >90 mL/min (ref >90)
GFR calc non Af Amer: 78 mL/min — ABNORMAL LOW (ref >90)
Potassium: 3.6 mEq/L (ref 3.5–5.1)
Sodium: 121 mEq/L — ABNORMAL LOW (ref 135–145)

## 2013-05-26 MED ORDER — LEVOFLOXACIN 750 MG PO TABS
750.0000 mg | ORAL_TABLET | ORAL | Status: DC
  Administered 2013-05-28: 750 mg via ORAL
  Filled 2013-05-26: qty 1

## 2013-05-26 MED ORDER — LEVOFLOXACIN 500 MG PO TABS
500.0000 mg | ORAL_TABLET | Freq: Every day | ORAL | Status: DC
  Filled 2013-05-26: qty 1

## 2013-05-26 NOTE — Clinical Documentation Improvement (Signed)
THIS DOCUMENT IS NOT A PERMANENT PART OF THE MEDICAL RECORD  Please update your documentation with the medical record to reflect your response to this query. If you need help knowing how to do this please call [phone].  05/26/13   Dr. Arthor Captain,  In a better effort to capture your patient's severity of illness, reflect appropriate length of stay and utilization of resources, a review of the patient medical record has revealed the following indicators regarding malnutrition:  Progress Notes signed by Haynes Bast, RD at 05/22/2013 2:47 PM    INITIAL NUTRITION ASSESSMENT    DOCUMENTATION CODES  Per approved criteria    -Severe malnutrition in the context of social or environmental circumstances    INTERVENTION:  Add Ensure Complete po daily, each supplement provides 350 kcal and 13 grams of protein.  RD provided "Heart Failure Nutrition Therapy" information to patient.  RD to continue to follow nutrition care plan.  NUTRITION DIAGNOSIS:  Malnutrition related to dementia as evidenced by severe muscle wasting and ongoing weight loss.  Goal:  Intake to meet >90% of estimated nutrition needs.  Monitor:  weight trends, lab trends, I/O's, PO intake, supplement tolerance  Reason for Assessment: Malnutrition Screening Tool        Based on your clinical judgment, please document in the progress notes and discharge summary if you agree with the registered dietician's assessment of the patient's nutritional status.    If you disagree, please document a condition representative of the patient's nutritional status.    In responding to this query please exercise your independent judgment.     The fact that a query is asked, does not imply that any particular answer is desired or expected.   Reviewed: additional documentation in the medical record  Thank You,  Jerral Ralph  RN BSN CCDS Certified Clinical Documentation Specialist: [phone] Health Information  Management [place]

## 2013-05-26 NOTE — Progress Notes (Signed)
ANTIBIOTIC CONSULT NOTE - FOLLOW UP  Pharmacy Consult for Levaquin Indication: possible pneumonia  No Known Allergies  Patient Measurements: Height: 5\' 3"  (160 cm) Weight: 111 lb 8.8 oz (50.6 kg) IBW/kg (Calculated) : 52.4  Vital Signs: Temp: 98.6 F (37 C) (08/25 0529) Temp src: Oral (08/25 0529) BP: 118/66 mmHg (08/25 0529) Pulse Rate: 91 (08/25 1101) Intake/Output from previous day: 08/24 0701 - 08/25 0700 In: 920 [P.O.:920] Out: -  Intake/Output from this shift: Total I/O In: 120 [P.O.:120] Out: -   Labs:  Recent Labs  05/25/13 0445 05/26/13 0515  WBC 12.0*  --   HGB 12.4  --   PLT 367  --   CREATININE 0.63 0.69   Estimated Creatinine Clearance: 42.6 ml/min (by C-G formula based on Cr of 0.69). No results found for this basename: VANCOTROUGH, VANCOPEAK, VANCORANDOM, GENTTROUGH, GENTPEAK, GENTRANDOM, TOBRATROUGH, TOBRAPEAK, TOBRARND, AMIKACINPEAK, AMIKACINTROU, AMIKACIN,  in the last 72 hours   Microbiology: Recent Results (from the past 720 hour(s))  CULTURE, BLOOD (ROUTINE X 2)     Status: None   Collection Time    05/20/13  5:30 PM      Result Value Range Status   Specimen Description BLOOD RIGHT ANTECUBITAL   Final   Special Requests BOTTLES DRAWN AEROBIC AND ANAEROBIC 10CC   Final   Culture  Setup Time     Final   Value: 05/21/2013 01:29     Performed at [facility]   Culture     Final   Value:        BLOOD CULTURE RECEIVED NO GROWTH TO DATE CULTURE WILL BE HELD FOR 5 DAYS BEFORE ISSUING A FINAL NEGATIVE REPORT     Performed at [facility]   Report Status PENDING   Incomplete  CULTURE, BLOOD (ROUTINE X 2)     Status: None   Collection Time    05/20/13  5:45 PM      Result Value Range Status   Specimen Description BLOOD LEFT ANTECUBITAL   Final   Special Requests BOTTLES DRAWN AEROBIC AND ANAEROBIC 10CC   Final   Culture  Setup Time     Final   Value: 05/21/2013 02:12     Performed at [facility]   Culture     Final   Value:        BLOOD CULTURE RECEIVED NO GROWTH TO DATE CULTURE WILL BE HELD FOR 5 DAYS BEFORE ISSUING A FINAL NEGATIVE REPORT     Performed at [facility]   Report Status PENDING   Incomplete  URINE CULTURE     Status: None   Collection Time    05/21/13  2:30 AM      Result Value Range Status   Specimen Description URINE, CLEAN CATCH   Final   Special Requests NONE   Final   Culture  Setup Time     Final   Value: 05/21/2013 05:30     Performed at [facility] Count     Final   Value: NO GROWTH     Performed at [facility]   Culture     Final   Value: NO GROWTH     Performed at [facility]   Report Status 05/22/2013 FINAL   Final    Anti-infectives   Start     Dose/Rate Route Frequency Ordered Stop   05/28/13 1200  levofloxacin (LEVAQUIN) tablet 750 mg     750 mg Oral Every  48 hours 05/26/13 1146     05/26/13 1115  levofloxacin (LEVAQUIN) tablet 500 mg  Status:  Discontinued     500 mg Oral Daily 05/26/13 1114 05/26/13 1146   05/22/13 1000  levofloxacin (LEVAQUIN) IVPB 750 mg  Status:  Discontinued     750 mg 100 mL/hr over 90 Minutes Intravenous Every 48 hours 05/20/13 1924 05/26/13 1114   05/20/13 1600  levofloxacin (LEVAQUIN) IVPB 750 mg  Status:  Discontinued     750 mg 100 mL/hr over 90 Minutes Intravenous  Once 05/20/13 1549 05/20/13 1555   05/20/13 1600  levofloxacin (LEVAQUIN) IVPB 750 mg    Comments:  Please start after cultures drawn   750 mg 100 mL/hr over 90 Minutes Intravenous  Once 05/20/13 1555 05/20/13 1801      Assessment: 79 y/o female on day 7 Levaquin for possible pneumonia. Patient is afebrile, WBC are slightly elevated, and culture data is negative thus far. Renal function is stable with CrCl ~42 ml/min.  Goal of Therapy:  Eradication of infection  Plan:  - Change Levaquin to 750 mg PO q48h - Follow-up LOT  [facility], [place].D., BCPS Clinical Pharmacist Pager: [phone] 05/26/2013 11:50 AM

## 2013-05-26 NOTE — Progress Notes (Signed)
   SUBJECTIVE:  She is somnolent and has baseline confusion.    PHYSICAL EXAM Filed Vitals:   05/25/13 2036 05/26/13 0529 05/26/13 1101 05/26/13 1339  BP: 98/84 118/66  94/53  Pulse: 97 78 91 96  Temp: 98 F (36.7 C) 98.6 F (37 C)  97.3 F (36.3 C)  TempSrc: Oral Oral  Oral  Resp: 19 18  16   Height:      Weight:  111 lb 8.8 oz (50.6 kg)    SpO2: 97% 96%  95%   General:  No distress Lungs:  Clear Heart:  RRR Abdomen:  Positive bowel sounds, no rebound no guarding Extremities:  No edema.  LABS: Lab Results  Component Value Date   TROPONINI <0.30 05/21/2013   Results for orders placed during the hospital encounter of 05/20/13 (from the past 24 hour(s))  BASIC METABOLIC PANEL     Status: Abnormal   Collection Time    05/26/13  5:15 AM      Result Value Range   Sodium 121 (*) 135 - 145 mEq/L   Potassium 3.6  3.5 - 5.1 mEq/L   Chloride 85 (*) 96 - 112 mEq/L   CO2 27  19 - 32 mEq/L   Glucose, Bld 94  70 - 99 mg/dL   BUN 21  6 - 23 mg/dL   Creatinine, Ser 1.61  0.50 - 1.10 mg/dL   Calcium 8.3 (*) 8.4 - 10.5 mg/dL   GFR calc non Af Amer 78 (*) >90 mL/min   GFR calc Af Amer >90  >90 mL/min    Intake/Output Summary (Last 24 hours) at 05/26/13 1411 Last data filed at 05/26/13 1006  Gross per 24 hour  Intake    800 ml  Output      0 ml  Net    800 ml    ASSESSMENT AND PLAN:  ACUTE ON CHRONIC SYSTOLIC AND DIASTOLIC HF:   I/O incomplete.   Down about 15 lbs since admission.  With her falling Na I will hold the PO Lasix today.    CAD:  Medical management.   ATRIAL FIB:  Per previous notes no plans for systemic anticoagulation.  I reviewed her telemetry today and she appears to have reasonable rate control.   HYPONATREMIA:  Na is falling.  Holding Lasix and follow BMET.    Rollene Rotunda 05/26/2013 2:11 PM

## 2013-05-26 NOTE — Progress Notes (Signed)
Occupational Therapy Treatment Patient Details Name: [name] MRN: [id number] DOB: 17-Feb-1928 Today's Date: 05/26/2013 Time: 4098-1191 OT Time Calculation (min): 14 min  OT Assessment / Plan / Recommendation  History of present illness 79 y.o. female admitted to [facility] on 05/20/13 with PMHx hypertension, dementia, A. fib with RVR presents with her family members from her doctor's office after she was found to be in atrial fibrillation. The family reports that the patient has had increased shortness of breath and has been rubbing her chest and leaning over for the last several days. This is relatively persistent, she has associated swelling in her lower extremities.  Pt diagnosed with new onset CHF.  She has had one fall recently and is extremely anxious about falling at this point   OT comments  Pt's fear of falling leading to anxiety leading to DOE leading to slow progress  Follow Up Recommendations  Home health OT;Supervision/Assistance - 24 hour;Other (comment)       Equipment Recommendations  3 in 1 bedside comode;Wheelchair (measurements OT);Wheelchair cushion (measurements OT)       Frequency Min 2X/week   Progress towards OT Goals Progress towards OT goals: Progressing toward goals  Plan Discharge plan remains appropriate    Precautions / Restrictions Precautions Precautions: Fall Precaution Comments: pt is VERY fearful of falling, gets anxious and displays with DOE with activity due to this Restrictions Weight Bearing Restrictions: No       ADL  Equipment Used: Gait belt;Rolling walker Transfers/Ambulation Related to ADLs: At end first session I wondered if a wide RW would work better for her due to her wide stance due to her valgus knees. I was going to let PT know that they may want to try this; however found out that pt was not on PT list today. I tried wide RW with her and it did not help it seemed to even make her ambulation worse (shuffling more in place than advancing  forward). Decided to try holding onto gait belt around her and walking backwards in front of her with her holding onto my upper arms, this actually worked better than using either RW. Daughter made aware she can purchase a gait belt in the gift shop. Also made daughter aware that if pt keeps "catching" her left toe when she walks then may want to consider an AFO that her primary MD can write an order for  (per daughter pt has a drop foot on that side, when examined she does have a neutral ankle but not beyond for dorsiflexion.      OT Goals(current goals can now be found in the care plan section)    Visit Information  Last OT Received On: 05/26/13 Assistance Needed: +1 History of Present Illness: 79 y.o. female admitted to [facility] on 05/20/13 with PMHx hypertension, dementia, A. fib with RVR presents with her family members from her doctor's office after she was found to be in atrial fibrillation. The family reports that the patient has had increased shortness of breath and has been rubbing her chest and leaning over for the last several days. This is relatively persistent, she has associated swelling in her lower extremities.  Pt diagnosed with new onset CHF.  She has had one fall recently and is extremely anxious about falling at this point          Cognition  Cognition Arousal/Alertness: Awake/alert Behavior During Therapy: Anxious Overall Cognitive Status: Impaired/Different from baseline Safety/Judgement: Decreased awareness of safety Problem Solving: Requires verbal cues;Requires tactile cues  General Comments: Pt with h/o dementia with confusion noted in hospital.               End of Session OT - End of Session Equipment Utilized During Treatment: Gait belt;Rolling walker Activity Tolerance:  (limited by anxiety which caused DOE) Patient left: in chair;with call bell/phone within reach;with family/visitor present       Evette Georges [phone] 05/26/2013, 12:10 PM

## 2013-05-26 NOTE — Progress Notes (Signed)
Occupational Therapy Treatment Patient Details Name: [name] MRN: [id number] DOB: [birth year] Today's Date: 05/26/2013 Time: 1032-1100 OT Time Calculation (min): 28 min  OT Assessment / Plan / Recommendation  History of present illness 79 y.o. female admitted to [facility] on 05/20/13 with PMHx hypertension, dementia, A. fib with RVR presents with her family members from her doctor's office after she was found to be in atrial fibrillation. The family reports that the patient has had increased shortness of breath and has been rubbing her chest and leaning over for the last several days. This is relatively persistent, she has associated swelling in her lower extremities.  Pt diagnosed with new onset CHF.  She has had one fall recently and is extremely anxious about falling at this point   OT comments  Pt making progress with limitations due anxious due to fear of falling  Follow Up Recommendations  Home health OT;Supervision/Assistance - 24 hour;Other (comment)       Equipment Recommendations  3 in 1 bedside comode;Wheelchair (measurements OT);Wheelchair cushion (measurements OT)       Frequency Min 2X/week   Progress towards OT Goals Progress towards OT goals: Progressing toward goals  Plan Discharge plan remains appropriate    Precautions / Restrictions Precautions Precautions: Fall Restrictions Weight Bearing Restrictions: No       ADL  Grooming: Wash/dry hands;Wash/dry face;Teeth care;Brushing hair;Set up;Minimal assistance (minimal A for standing) Where Assessed - Grooming: Supported standing Lower Body Dressing: Maximal assistance (for socks and shoes) Where Assessed - Lower Body Dressing: Supported sitting Toilet Transfer: Moderate assistance Toilet Transfer Method: Sit to stand Toilet Transfer Equipment: Bedside commode Equipment Used: Gait belt;Rolling walker Transfers/Ambulation Related to ADLs: Mod A overall due to wide walking stance due to knees and anxiety of fear of  falling     OT Goals(current goals can now be found in the care plan section)    Visit Information  Last OT Received On: 05/26/13 Assistance Needed: +1 History of Present Illness: 79 y.o. female admitted to [facility] on 05/20/13 with PMHx hypertension, dementia, A. fib with RVR presents with her family members from her doctor's office after she was found to be in atrial fibrillation. The family reports that the patient has had increased shortness of breath and has been rubbing her chest and leaning over for the last several days. This is relatively persistent, she has associated swelling in her lower extremities.  Pt diagnosed with new onset CHF.  She has had one fall recently and is extremely anxious about falling at this point          Cognition  Cognition Arousal/Alertness: Awake/alert Behavior During Therapy: Anxious Overall Cognitive Status: Impaired/Different from baseline Safety/Judgement: Decreased awareness of safety Problem Solving: Requires tactile cues;Requires verbal cues General Comments: Pt with h/o dementia with confusion noted in hospital.      Mobility  Bed Mobility Bed Mobility: Supine to Sit;Sitting - Scoot to Edge of Bed Supine to Sit: 4: Min guard;With rails;HOB elevated Sitting - Scoot to Edge of Bed: 4: Min guard;With rail Transfers Transfers: Sit to Stand;Stand to Sit Sit to Stand: 3: Mod assist;With upper extremity assist;From bed Stand to Sit: 3: Mod assist;With upper extremity assist;With armrests;To chair/3-in-1 Details for Transfer Assistance: Pt tends to want to lean posteriorly with sit to stand and with ambulation (due to keeps head down)       Balance Static Standing Balance Static Standing - Balance Support: Left upper extremity supported;During functional activity (at sink for grooming) Static Standing - Level  of Assistance: 4: Min assist Static Standing - Comment/# of Minutes: 6 minutes; posterior lean with verbal cues to stay forward   End of  Session OT - End of Session Equipment Utilized During Treatment: Gait belt;Rolling walker Activity Tolerance:  (limited by anxiety with causes DOE) Patient left: in chair;with call bell/phone within reach;with family/visitor present       Evette Georges [phone] 05/26/2013, 11:58 AM

## 2013-05-26 NOTE — Care Management Note (Signed)
    Page 1 of 2   05/26/2013     4:34:51 PM   CARE MANAGEMENT NOTE 05/26/2013  Patient:  [name], [name]   Account Number:  [id number]  Date Initiated:  05/26/2013  Documentation initiated by:  Tera Mater  Subjective/Objective Assessment:   79yo female admitted with Chest Pain.  Pt. lives at home with daughter.     Action/Plan:   discharge planning   Anticipated DC Date:  05/27/2013   Anticipated DC Plan:  HOME W HOME HEALTH SERVICES      DC Planning Services  CM consult      [facility] Choice  HOME HEALTH   Choice offered to / List presented to:  C-4 Adult Children        HH arranged  HH-2 PT  HH-1 RN      [facility] agency  [facility]   Status of service:  In process, will continue to follow Medicare Important Message given?   (If response is "NO", the following Medicare IM given date fields will be blank) Date Medicare IM given:   Date Additional Medicare IM given:    Discharge Disposition:    Per UR Regulation:  Reviewed for med. necessity/level of care/duration of stay  If discussed at Long Length of Stay Meetings, dates discussed:    Comments:  05/26/13 1415 In to complete heart failure home health screen.  Pt. is agreeable.  Daughter looked over home health agency list, and chose [facility].  Pt. would also like a wheelchair, and a 3-N-1.  Will obtain orders for DME.  TC to Debbie, with [facility], to give referral for [facility] RN/PT.  Pt. may dc home tomorrow. Tera Mater, RN, BSN NCM [phone]

## 2013-05-27 DIAGNOSIS — E46 Unspecified protein-calorie malnutrition: Secondary | ICD-10-CM | POA: Diagnosis present

## 2013-05-27 LAB — BASIC METABOLIC PANEL
BUN: 21 mg/dL (ref 6–23)
Chloride: 90 mEq/L — ABNORMAL LOW (ref 96–112)
GFR calc Af Amer: >90 mL/min (ref >90)
GFR calc non Af Amer: 79 mL/min — ABNORMAL LOW (ref >90)
Potassium: 3.6 mEq/L (ref 3.5–5.1)
Sodium: 126 mEq/L — ABNORMAL LOW (ref 135–145)

## 2013-05-27 LAB — CULTURE, BLOOD (ROUTINE X 2): Culture: NO GROWTH

## 2013-05-27 MED ORDER — FUROSEMIDE 20 MG PO TABS
20.0000 mg | ORAL_TABLET | Freq: Every day | ORAL | Status: DC
  Administered 2013-05-27 – 2013-05-29 (×2): 20 mg via ORAL
  Filled 2013-05-27 (×4): qty 1

## 2013-05-27 MED ORDER — LISINOPRIL 2.5 MG PO TABS
2.5000 mg | ORAL_TABLET | Freq: Every day | ORAL | Status: DC
  Administered 2013-05-27: 2.5 mg via ORAL
  Filled 2013-05-27 (×2): qty 1

## 2013-05-27 NOTE — Progress Notes (Signed)
[facility] PROGRESS NOTE  [name] ZOX:[id number] DOB: 03/06/1928 DOA: 05/20/2013 PCP: [name], DO  HPI/Subjective: Had some shortness of breath last night, as well as productive cough. Off of Lasix since the 21st because of low blood pressure.  Assessment/Plan:  Acute systolic CHF -LVEF is 35% with diffuse hypokinesis. -Patient is on beta blockers, did not tolerate diuresis and lisinopril secondary to hypotension. -Cardiology is following, cardiac cath showed single-vessel obstructive disease involving the LAD. -Optimize beta blockade therapy, ACE inhibitor and diuretics if blood pressure allows. -Patient still has bilateral effusion and chest x-ray indicative of mild fluid overload/pulmonary edema. -I will restart Lasix today.  Nonischemic cardiomyopathy -Cardiology is following, Cardiac cath showed LVEF of 35% with moderately severe proximal to mid LAD stenosis. -No much change of patient weight since admission. -Continue beta blockers, ACE inhibitors and diuretics as blood pressure allows, will let cards to decide about it.  Atrial fibrillation with RVR -Cardiology service following, recommended to continue beta blockers. -Recommendation for anticoagulation to follow, is on low dose aspirin  Hyponatremia -Asymptomatic hyponatremia, likely secondary to CHF and dilutional hyponatremia. -Check BMP in a.m. after Lasix restarted.   Possible pneumonia  -This is treated empirically with antibiotics, patient is on Levaquin and Mucinex. -Possible infiltrates along the right lung base, this is treated as CAP.  Code Status: Full code Family Communication: Plan discussed with the patient. Disposition Plan: Remains inpatient, PT/OT to evaluate and treat.   Consultants:  Cardiology  Procedures:  Cardiac cath:  Final Conclusions:  1. Single vessel obstructive CAD involving the LAD.  2. Low normal LV systolic function. EF 50%.  3. Low LV filling pressures.   Recommendations: Hold IV diuretics. Optimize beta blocker therapy. If the patient has refractory chest pain despite optimal medical therapy with 2 antianginal agents she could be considered for PCI of the LAD. Otherwise I would treat her with conservative medical therapy.  Antibiotics:  Levaquin started on 8/21  Objective: Filed Vitals:   05/27/13 0949  BP: 106/60  Pulse:   Temp:   Resp:     Intake/Output Summary (Last 24 hours) at 05/27/13 1428 Last data filed at 05/27/13 0949  Gross per 24 hour  Intake    480 ml  Output    300 ml  Net    180 ml   Filed Weights   05/25/13 0435 05/26/13 0529 05/27/13 0525  Weight: 51.9 kg (114 lb 6.7 oz) 50.6 kg (111 lb 8.8 oz) 51.4 kg (113 lb 5.1 oz)    Exam: General: Alert and awake, oriented x3, not in any acute distress. HEENT: anicteric sclera, pupils reactive to light and accommodation, EOMI CVS: S1-S2 clear, no murmur rubs or gallops Chest: clear to auscultation bilaterally, no wheezing, rales or rhonchi Abdomen: soft nontender, nondistended, normal bowel sounds, no organomegaly Extremities: no cyanosis, clubbing or edema noted bilaterally Neuro: Cranial nerves II-XII intact, no focal neurological deficits  Data Reviewed: Basic Metabolic Panel:  Recent Labs Lab 05/20/13 1438  05/21/13 0615 05/22/13 0545 05/22/13 2030 05/23/13 0500 05/24/13 0405 05/25/13 0445 05/26/13 0515 05/27/13 0555  NA 131*  --  131* 132*  --  130*  --  124* 121* 126*  K 3.6  --  2.8* 3.7  --  3.8  --  3.4* 3.6 3.6  CL 92*  --  89* 91*  --  89*  --  86* 85* 90*  CO2 28  --  32 33*  --  32  --  26 27 28  GLUCOSE 105*  --  95 99  --  89  --  97 94 89  BUN 20  --  19 21  --  29*  --  21 21 21   CREATININE 0.55  < > 0.72 0.77 0.91 0.90  --  0.63 0.69 0.67  CALCIUM 9.1  --  8.7 9.0  --  8.9  --  8.6 8.3* 8.3*  MG  --   --  1.8 1.9  --  1.9 1.8  --   --   --   < > = values in this interval not displayed. Liver Function Tests:  Recent Labs Lab  05/20/13 1438 05/21/13 0615  AST 24 21  ALT 27 23  ALKPHOS 59 54  BILITOT 0.4 0.6  PROT 5.9* 5.5*  ALBUMIN 3.0* 2.7*   No results found for this basename: LIPASE, AMYLASE,  in the last 168 hours No results found for this basename: AMMONIA,  in the last 168 hours CBC:  Recent Labs Lab 05/20/13 1438 05/20/13 1802 05/21/13 0615 05/22/13 2030 05/25/13 0445  WBC 13.4* 12.7* 11.6* 9.4 12.0*  NEUTROABS  --   --  8.7*  --   --   HGB 12.7 12.6 11.7* 12.2 12.4  HCT 36.1 36.2 33.8* 35.4* 35.9*  MCV 85.1 85.2 84.3 86.3 84.3  PLT 414* 399 359 357 367   Cardiac Enzymes:  Recent Labs Lab 05/20/13 1804 05/20/13 2325 05/21/13 0615  TROPONINI <0.30 <0.30 <0.30   BNP (last 3 results)  Recent Labs  05/20/13 1438 05/20/13 1804  PROBNP 7162.0* 6451.0*   CBG: No results found for this basename: GLUCAP,  in the last 168 hours  Micro Recent Results (from the past 240 hour(s))  CULTURE, BLOOD (ROUTINE X 2)     Status: None   Collection Time    05/20/13  5:30 PM      Result Value Range Status   Specimen Description BLOOD RIGHT ANTECUBITAL   Final   Special Requests BOTTLES DRAWN AEROBIC AND ANAEROBIC 10CC   Final   Culture  Setup Time     Final   Value: 05/21/2013 01:29     Performed at [facility]   Culture     Final   Value: NO GROWTH 5 DAYS     Performed at [facility]   Report Status 05/27/2013 FINAL   Final  CULTURE, BLOOD (ROUTINE X 2)     Status: None   Collection Time    05/20/13  5:45 PM      Result Value Range Status   Specimen Description BLOOD LEFT ANTECUBITAL   Final   Special Requests BOTTLES DRAWN AEROBIC AND ANAEROBIC 10CC   Final   Culture  Setup Time     Final   Value: 05/21/2013 02:12     Performed at [facility]   Culture     Final   Value: NO GROWTH 5 DAYS     Performed at [facility]   Report Status 05/27/2013 FINAL   Final  URINE CULTURE     Status: None   Collection Time    05/21/13  2:30 AM      Result  Value Range Status   Specimen Description URINE, CLEAN CATCH   Final   Special Requests NONE   Final   Culture  Setup Time     Final   Value: 05/21/2013 05:30     Performed at [facility] Count  Final   Value: NO GROWTH     Performed at [facility]   Culture     Final   Value: NO GROWTH     Performed at [facility]   Report Status 05/22/2013 FINAL   Final     Studies: No results found.  Scheduled Meds: . aspirin EC  81 mg Oral Daily  . atorvastatin  20 mg Oral q1800  . carvedilol  3.125 mg Oral BID WC  . digoxin  0.125 mg Oral Daily  . donepezil  5 mg Oral QHS  . feeding supplement  237 mL Oral Q24H  . furosemide  20 mg Oral Daily  . guaiFENesin  1,200 mg Oral BID  . heparin  5,000 Units Subcutaneous Q8H  . [START ON 05/28/2013] levofloxacin  750 mg Oral Q48H  . lisinopril  2.5 mg Oral Daily  . sodium chloride  3 mL Intravenous Q12H   Continuous Infusions:   Principal Problem:   Congestive heart failure, unspecified Active Problems:   HYPERTENSION   Atrial fibrillation with RVR   Dementia with behavioral disturbance   Leukocytosis, unspecified   Pleural effusion   Hyponatremia   Coronary atherosclerosis of native coronary artery   Non-ischemic cardiomyopathy    Time spent: 35 minutes    [facility] A  [facility] Pager [phone] If 7PM-7AM, please contact night-coverage at [email or web address], password [facility] 05/27/2013, 2:28 PM  LOS: 7 days

## 2013-05-27 NOTE — Progress Notes (Signed)
[facility] PROGRESS NOTE  Briane Birden ZOX:[id number] DOB: June 25, 1928 DOA: 05/20/2013 PCP: [name], DO  HPI/Subjective: Lasix was discontinued yesterday restarted today at lower dose of 20 mg daily. Lasix discontinued because of low sodium, sodium is improving. Cardiology recommended to keep overnight check BMP in a.m. if normal can be discharged home.  Assessment/Plan:  Acute systolic CHF -LVEF is 35% with diffuse hypokinesis. -Patient is on beta blockers, did not tolerate diuresis and lisinopril secondary to hypotension. -Cardiology is following, cardiac cath showed single-vessel obstructive disease involving the LAD. -Optimize beta blockade therapy. -Patient still has bilateral effusion and chest x-ray indicative of mild fluid overload/pulmonary edema. -Lasix restarted today at 20 mg daily, started lisinopril at 2.5 mg.  Nonischemic cardiomyopathy -Cardiology is following, Cardiac cath showed LVEF of 35% with moderately severe proximal to mid LAD stenosis. -No much change of patient weight since admission. -Continue beta blockers, ACE inhibitors and diuretics as blood pressure allows, will let cards to decide about it.  Atrial fibrillation with RVR -Cardiology service following, recommended to continue beta blockers. -Recommendation for anticoagulation to follow, is on low dose aspirin  Hyponatremia -Asymptomatic hyponatremia, likely secondary to CHF and dilutional hyponatremia. -Check BMP in a.m. after Lasix restarted.   Possible pneumonia  -This is treated empirically with antibiotics, patient is on Levaquin and Mucinex. -Possible infiltrates along the right lung base, this is treated as CAP.  Code Status: Full code Family Communication: Plan discussed with the patient. Disposition Plan: Remains inpatient, likely to be discharged in a.m.   Consultants:  Cardiology  Procedures:  Cardiac cath:  Final Conclusions:  1. Single vessel obstructive CAD  involving the LAD.  2. Low normal LV systolic function. EF 50%.  3. Low LV filling pressures.  Recommendations: Hold IV diuretics. Optimize beta blocker therapy. If the patient has refractory chest pain despite optimal medical therapy with 2 antianginal agents she could be considered for PCI of the LAD. Otherwise I would treat her with conservative medical therapy.  Antibiotics:  Levaquin started on 8/21  Objective: Filed Vitals:   05/27/13 0949  BP: 106/60  Pulse:   Temp:   Resp:     Intake/Output Summary (Last 24 hours) at 05/27/13 1429 Last data filed at 05/27/13 0949  Gross per 24 hour  Intake    480 ml  Output    300 ml  Net    180 ml   Filed Weights   05/25/13 0435 05/26/13 0529 05/27/13 0525  Weight: 51.9 kg (114 lb 6.7 oz) 50.6 kg (111 lb 8.8 oz) 51.4 kg (113 lb 5.1 oz)    Exam: General: Alert and awake, oriented x3, not in any acute distress. HEENT: anicteric sclera, pupils reactive to light and accommodation, EOMI CVS: S1-S2 clear, no murmur rubs or gallops Chest: clear to auscultation bilaterally, no wheezing, rales or rhonchi Abdomen: soft nontender, nondistended, normal bowel sounds, no organomegaly Extremities: no cyanosis, clubbing or edema noted bilaterally Neuro: Cranial nerves II-XII intact, no focal neurological deficits  Data Reviewed: Basic Metabolic Panel:  Recent Labs Lab 05/20/13 1438  05/21/13 0615 05/22/13 0545 05/22/13 2030 05/23/13 0500 05/24/13 0405 05/25/13 0445 05/26/13 0515 05/27/13 0555  NA 131*  --  131* 132*  --  130*  --  124* 121* 126*  K 3.6  --  2.8* 3.7  --  3.8  --  3.4* 3.6 3.6  CL 92*  --  89* 91*  --  89*  --  86* 85* 90*  CO2 28  --  32 33*  --  32  --  26 27 28   GLUCOSE 105*  --  95 99  --  89  --  97 94 89  BUN 20  --  19 21  --  29*  --  21 21 21   CREATININE 0.55  < > 0.72 0.77 0.91 0.90  --  0.63 0.69 0.67  CALCIUM 9.1  --  8.7 9.0  --  8.9  --  8.6 8.3* 8.3*  MG  --   --  1.8 1.9  --  1.9 1.8  --   --   --    < > = values in this interval not displayed. Liver Function Tests:  Recent Labs Lab 05/20/13 1438 05/21/13 0615  AST 24 21  ALT 27 23  ALKPHOS 59 54  BILITOT 0.4 0.6  PROT 5.9* 5.5*  ALBUMIN 3.0* 2.7*   No results found for this basename: LIPASE, AMYLASE,  in the last 168 hours No results found for this basename: AMMONIA,  in the last 168 hours CBC:  Recent Labs Lab 05/20/13 1438 05/20/13 1802 05/21/13 0615 05/22/13 2030 05/25/13 0445  WBC 13.4* 12.7* 11.6* 9.4 12.0*  NEUTROABS  --   --  8.7*  --   --   HGB 12.7 12.6 11.7* 12.2 12.4  HCT 36.1 36.2 33.8* 35.4* 35.9*  MCV 85.1 85.2 84.3 86.3 84.3  PLT 414* 399 359 357 367   Cardiac Enzymes:  Recent Labs Lab 05/20/13 1804 05/20/13 2325 05/21/13 0615  TROPONINI <0.30 <0.30 <0.30   BNP (last 3 results)  Recent Labs  05/20/13 1438 05/20/13 1804  PROBNP 7162.0* 6451.0*   CBG: No results found for this basename: GLUCAP,  in the last 168 hours  Micro Recent Results (from the past 240 hour(s))  CULTURE, BLOOD (ROUTINE X 2)     Status: None   Collection Time    05/20/13  5:30 PM      Result Value Range Status   Specimen Description BLOOD RIGHT ANTECUBITAL   Final   Special Requests BOTTLES DRAWN AEROBIC AND ANAEROBIC 10CC   Final   Culture  Setup Time     Final   Value: 05/21/2013 01:29     Performed at [facility]   Culture     Final   Value: NO GROWTH 5 DAYS     Performed at [facility]   Report Status 05/27/2013 FINAL   Final  CULTURE, BLOOD (ROUTINE X 2)     Status: None   Collection Time    05/20/13  5:45 PM      Result Value Range Status   Specimen Description BLOOD LEFT ANTECUBITAL   Final   Special Requests BOTTLES DRAWN AEROBIC AND ANAEROBIC 10CC   Final   Culture  Setup Time     Final   Value: 05/21/2013 02:12     Performed at [facility]   Culture     Final   Value: NO GROWTH 5 DAYS     Performed at [facility]   Report Status 05/27/2013 FINAL   Final   URINE CULTURE     Status: None   Collection Time    05/21/13  2:30 AM      Result Value Range Status   Specimen Description URINE, CLEAN CATCH   Final   Special Requests NONE   Final   Culture  Setup Time     Final   Value: 05/21/2013 05:30  Performed at [facility] Count     Final   Value: NO GROWTH     Performed at [facility]   Culture     Final   Value: NO GROWTH     Performed at [facility]   Report Status 05/22/2013 FINAL   Final     Studies: No results found.  Scheduled Meds: . aspirin EC  81 mg Oral Daily  . atorvastatin  20 mg Oral q1800  . carvedilol  3.125 mg Oral BID WC  . digoxin  0.125 mg Oral Daily  . donepezil  5 mg Oral QHS  . feeding supplement  237 mL Oral Q24H  . furosemide  20 mg Oral Daily  . guaiFENesin  1,200 mg Oral BID  . heparin  5,000 Units Subcutaneous Q8H  . [START ON 05/28/2013] levofloxacin  750 mg Oral Q48H  . lisinopril  2.5 mg Oral Daily  . sodium chloride  3 mL Intravenous Q12H   Continuous Infusions:   Principal Problem:   Congestive heart failure, unspecified Active Problems:   HYPERTENSION   Atrial fibrillation with RVR   Dementia with behavioral disturbance   Leukocytosis, unspecified   Pleural effusion   Hyponatremia   Coronary atherosclerosis of native coronary artery   Non-ischemic cardiomyopathy    Time spent: 35 minutes    [facility] A  [facility] Pager [phone] If 7PM-7AM, please contact night-coverage at [email or web address], password [facility] 05/27/2013, 2:29 PM  LOS: 7 days

## 2013-05-27 NOTE — Progress Notes (Signed)
Pt ambulated with PT today.  Please see noted.  Amanda Pea, Charity fundraiser.

## 2013-05-27 NOTE — Progress Notes (Signed)
Physical Therapy Treatment Patient Details Name: [name] MRN: [id number] DOB: 08/09/1928 Today's Date: 05/27/2013 Time: 4098-1191 PT Time Calculation (min): 31 min  PT Assessment / Plan / Recommendation  History of Present Illness 79 y.o. female admitted to [facility] on 05/20/13 with PMHx hypertension, dementia, A. fib with RVR presents with her family members from her doctor's office after she was found to be in atrial fibrillation. The family reports that the patient has had increased shortness of breath and has been rubbing her chest and leaning over for the last several days. This is relatively persistent, she has associated swelling in her lower extremities.  Pt diagnosed with new onset CHF.  She has had one fall recently and is extremely anxious about falling at this point   PT Comments   Pt cont's to be very fearful of falling.  Requires encouragement to ambulate.    Follow Up Recommendations  Home health PT;Supervision/Assistance - 24 hour     Does the patient have the potential to tolerate intense rehabilitation     Barriers to Discharge        Equipment Recommendations  Wheelchair (measurements PT);Wheelchair cushion (measurements PT)    Recommendations for Other Services    Frequency Min 3X/week   Progress towards PT Goals Progress towards PT goals: Progressing toward goals  Plan Current plan remains appropriate    Precautions / Restrictions Precautions Precautions: Fall Precaution Comments: pt is VERY fearful of falling, gets anxious and displays with DOE with activity due to this Restrictions Weight Bearing Restrictions: No   Pertinent Vitals/Pain No pain reported    Mobility  Bed Mobility Bed Mobility: Supine to Sit;Sitting - Scoot to Edge of Bed Supine to Sit: 4: Min guard;HOB elevated;With rails Sitting - Scoot to Edge of Bed: 4: Min guard Transfers Transfers: Sit to Stand;Stand to Sit Sit to Stand: 3: Mod assist;With upper extremity assist;With armrests;From  bed;From chair/3-in-1 Stand to Sit: 3: Mod assist;With upper extremity assist;With armrests;To chair/3-in-1;To bed Details for Transfer Assistance: cues for safe hand placement & body positioning before sitting.  (A) to achieve standing, anterior translation of trunk over BOS, guidance of hips to get completely squared up with seated surface before sitting, & controlled descent Ambulation/Gait Ambulation/Gait Assistance: 4: Min assist Ambulation Distance (Feet): 100 Feet Assistive device: Rolling walker Ambulation/Gait Assistance Details: (A) for balance & RW management.  cues for safe use of RW, posture, to look ahead, & to keep both feet within RW due to pt with wide BOS & Rt LE stays on outside of RW.   Gait Pattern: Step-through pattern;Decreased stride length;Wide base of support;Trunk flexed;Shuffle General Gait Details: Pt very fearful of falling Stairs: No Wheelchair Mobility Wheelchair Mobility: No      PT Goals (current goals can now be found in the care plan section) Acute Rehab PT Goals PT Goal Formulation: With patient Time For Goal Achievement: 06/05/13 Potential to Achieve Goals: Good  Visit Information  Last PT Received On: 05/27/13 Assistance Needed: +1 History of Present Illness: 79 y.o. female admitted to [facility] on 05/20/13 with PMHx hypertension, dementia, A. fib with RVR presents with her family members from her doctor's office after she was found to be in atrial fibrillation. The family reports that the patient has had increased shortness of breath and has been rubbing her chest and leaning over for the last several days. This is relatively persistent, she has associated swelling in her lower extremities.  Pt diagnosed with new onset CHF.  She has had one  fall recently and is extremely anxious about falling at this point    Subjective Data      Cognition  Cognition Arousal/Alertness: Awake/alert Behavior During Therapy: Anxious Overall Cognitive Status:  Impaired/Different from baseline Safety/Judgement: Decreased awareness of safety Problem Solving: Requires verbal cues;Requires tactile cues General Comments: Pt with h/o dementia with confusion noted in hospital.      Balance  Static Standing Balance Static Standing - Balance Support: Bilateral upper extremity supported Static Standing - Level of Assistance: 4: Min assist Static Standing - Comment/# of Minutes: for pericare  End of Session PT - End of Session Equipment Utilized During Treatment: Gait belt Activity Tolerance: Patient tolerated treatment well;Other (comment) (fearful of falling) Patient left: in chair;with call bell/phone within reach;with family/visitor present Nurse Communication: Mobility status   GP     Lara Mulch 05/27/2013, 2:03 PM  Verdell Face, PTA [phone] 05/27/2013

## 2013-05-27 NOTE — Progress Notes (Signed)
NUTRITION FOLLOW UP  Intervention:   Continue Ensure Complete daily, pt may trade Ensure for Chocolate milk if desired.  Will modify order. RD to continue to follow nutrition care plan.   Nutrition Dx:   Malnutrition, ongoing with improvement  Goal:   Intake to meet >90% of estimated nutrition needs. Met, pt with improved intake 50-100% of meals and supplement daily.   Monitor:  weight trends, lab trends, I/O's, PO intake, supplement tolerance   Assessment:   PMHx significant for HTN, dementia and afib. Admitted with increased SOB x several days and swelling in LE. Work-up reveals afib and new onset CHF. Pt also has had recent falls with her dementia.  Pt found to meet criteria for malnutrition during RD assessment.  Wt loss suspected due to difficulty remembering to eat. Pt states she has been eating well as inpatient.  She cannot remember what she had for lunch today.  She reports she has been drinking Ensure. Family arrives during visit and confirms pt has been eating and drinking.  Daughter states pt would prefer chocolate milk to Ensure.  RD discussed this as an appropriate alternative.  Family reports pt is receptive to encouragement and seems to be eating better and has been eating more variety.  Family without additional questions at this time.  Height: Ht Readings from Last 1 Encounters:  05/20/13 5\' 3"  (1.6 m)    Weight Status:   Wt Readings from Last 1 Encounters:  05/27/13 113 lb 5.1 oz (51.4 kg)    Re-estimated needs:  Kcal: 1200-1400 Protein: 51-65g Fluid: >1.4 L/day  Skin: intact  Diet Order: Cardiac   Intake/Output Summary (Last 24 hours) at 05/27/13 1437 Last data filed at 05/27/13 1436  Gross per 24 hour  Intake    720 ml  Output    300 ml  Net    420 ml    Last BM: 8/25   Labs:   Recent Labs Lab 05/22/13 0545  05/23/13 0500 05/24/13 0405 05/25/13 0445 05/26/13 0515 05/27/13 0555  NA 132*  --  130*  --  124* 121* 126*  K 3.7  --   3.8  --  3.4* 3.6 3.6  CL 91*  --  89*  --  86* 85* 90*  CO2 33*  --  32  --  26 27 28   BUN 21  --  29*  --  21 21 21   CREATININE 0.77  < > 0.90  --  0.63 0.69 0.67  CALCIUM 9.0  --  8.9  --  8.6 8.3* 8.3*  MG 1.9  --  1.9 1.8  --   --   --   GLUCOSE 99  --  89  --  97 94 89  < > = values in this interval not displayed.  CBG (last 3)  No results found for this basename: GLUCAP,  in the last 72 hours  Scheduled Meds: . aspirin EC  81 mg Oral Daily  . atorvastatin  20 mg Oral q1800  . carvedilol  3.125 mg Oral BID WC  . digoxin  0.125 mg Oral Daily  . donepezil  5 mg Oral QHS  . feeding supplement  237 mL Oral Q24H  . furosemide  20 mg Oral Daily  . guaiFENesin  1,200 mg Oral BID  . heparin  5,000 Units Subcutaneous Q8H  . [START ON 05/28/2013] levofloxacin  750 mg Oral Q48H  . lisinopril  2.5 mg Oral Daily  . sodium chloride  3 mL  Intravenous Q12H    Continuous Infusions:   Loyce Dys, MS RD LDN Clinical Inpatient Dietitian Pager: [phone] Weekend/After hours pager: [phone]

## 2013-05-27 NOTE — Progress Notes (Signed)
    SUBJECTIVE: No complaints.   BP 106/60  Pulse 84  Temp(Src) 97.2 F (36.2 C) (Axillary)  Resp 18  Ht 5\' 3"  (1.6 m)  Wt 113 lb 5.1 oz (51.4 kg)  BMI 20.08 kg/m2  SpO2 96%  Intake/Output Summary (Last 24 hours) at 05/27/13 1132 Last data filed at 05/27/13 0949  Gross per 24 hour  Intake    480 ml  Output    300 ml  Net    180 ml    PHYSICAL EXAM General: Well developed, well nourished, in no acute distress. Alert and oriented x 3.  Psych:  Good affect, responds appropriately Neck: No JVD. No masses noted.  Lungs: Clear bilaterally with no wheezes or rhonci noted.  Heart: Irreg irreg with no murmurs noted. Abdomen: Bowel sounds are present. Soft, non-tender.  Extremities: No lower extremity edema.   LABS: Basic Metabolic Panel:  Recent Labs  16/10/96 0515 05/27/13 0555  NA 121* 126*  K 3.6 3.6  CL 85* 90*  CO2 27 28  GLUCOSE 94 89  BUN 21 21  CREATININE 0.69 0.67  CALCIUM 8.3* 8.3*   CBC:  Recent Labs  05/25/13 0445  WBC 12.0*  HGB 12.4  HCT 35.9*  MCV 84.3  PLT 367    Current Meds: . aspirin EC  81 mg Oral Daily  . atorvastatin  20 mg Oral q1800  . carvedilol  3.125 mg Oral BID WC  . digoxin  0.125 mg Oral Daily  . donepezil  5 mg Oral QHS  . feeding supplement  237 mL Oral Q24H  . guaiFENesin  1,200 mg Oral BID  . heparin  5,000 Units Subcutaneous Q8H  . [START ON 05/28/2013] levofloxacin  750 mg Oral Q48H  . lisinopril  2.5 mg Oral Daily  . sodium chloride  3 mL Intravenous Q12H   ASSESSMENT AND PLAN:  1. ACUTE ON CHRONIC SYSTOLIC AND DIASTOLIC CHF: Seems to be euvolemic. Lasix on hold yesterday with hyponatremia. Would start Lasix 20 mg po Qdaily today. Recheck BMET in am and if sodium stable, should be ok for discharge tomorrow am.   2. CAD: Medical management.   3. ATRIAL FIB: Per previous notes no plans for systemic anticoagulation. I reviewed her telemetry today and she appears to have reasonable rate control.   4. HYPONATREMIA:  Sodium up to 126 today. Lasix held yesterday. Would resume low dose Lasix today. Per primary team, [facility] team.    [name]  8/26/201411:32 AM

## 2013-05-28 DIAGNOSIS — I959 Hypotension, unspecified: Secondary | ICD-10-CM

## 2013-05-28 LAB — BASIC METABOLIC PANEL
Chloride: 90 mEq/L — ABNORMAL LOW (ref 96–112)
Creatinine, Ser: 0.64 mg/dL (ref 0.50–1.10)
GFR calc Af Amer: >90 mL/min (ref >90)
Potassium: 4.2 mEq/L (ref 3.5–5.1)
Sodium: 125 mEq/L — ABNORMAL LOW (ref 135–145)

## 2013-05-28 MED ORDER — SODIUM CHLORIDE 0.9 % IV BOLUS (SEPSIS)
250.0000 mL | Freq: Once | INTRAVENOUS | Status: AC
  Administered 2013-05-28: 250 mL via INTRAVENOUS

## 2013-05-28 NOTE — Progress Notes (Signed)
Pt BP 80/50 manually with HR 77.  MD has been made aware.  PO Lasix, Lisinopril, Coreg, and Digoxin held at this time.  Ordered to give pt 250 cc NS bolus and recheck BP in 1-2 hours.  Will continue to monitor. Nino Glow RN

## 2013-05-28 NOTE — Progress Notes (Signed)
Physical Therapy Treatment Patient Details Name: [name] MRN: [id number] DOB: 1927-11-29 Today's Date: 05/28/2013 Time:  -     PT Assessment / Plan / Recommendation  History of Present Illness 79 y.o. female admitted to [facility] on 05/20/13 with PMHx hypertension, dementia, A. fib with RVR presents with her family members from her doctor's office after she was found to be in atrial fibrillation. The family reports that the patient has had increased shortness of breath and has been rubbing her chest and leaning over for the last several days. This is relatively persistent, she has associated swelling in her lower extremities.  Pt diagnosed with new onset CHF.  She has had one fall recently and is extremely anxious about falling at this point   PT Comments   Pt cont's with anxiety when up on feet which impacts safety with mobility however family has stated that pt will have 24/7 assist at home.     Follow Up Recommendations  Home health PT;Supervision/Assistance - 24 hour     Does the patient have the potential to tolerate intense rehabilitation     Barriers to Discharge        Equipment Recommendations  Wheelchair (measurements PT);Wheelchair cushion (measurements PT)    Recommendations for Other Services    Frequency Min 3X/week   Progress towards PT Goals Progress towards PT goals: Progressing toward goals  Plan Current plan remains appropriate    Precautions / Restrictions Precautions Precautions: Fall Precaution Comments: pt is VERY fearful of falling, gets anxious and displays with DOE with activity due to this Restrictions Weight Bearing Restrictions: No   Pertinent Vitals/Pain No pain reported.     Mobility  Bed Mobility Bed Mobility: Not assessed Transfers Transfers: Sit to Stand;Stand to Sit Sit to Stand: 3: Mod assist;With upper extremity assist;With armrests;From chair/3-in-1;From toilet Stand to Sit: 3: Mod assist;With upper extremity assist;With armrests;To  chair/3-in-1;To toilet Details for Transfer Assistance: Cues for hand placement & technique.  (A) to achieve standing, weight shifting fwds, balance, & controlled descent.  Pt fearful with standing Ambulation/Gait Ambulation/Gait Assistance: 4: Min assist Ambulation Distance (Feet): 150 Feet Assistive device: Rolling walker Ambulation/Gait Assistance Details: Pt with significant fwd flexed posture, wide BOS due to genu valgum, & pushes RW too far out in front of her.   Pt gets impulsive at times & begins to move quickly & jerk RW around with directional changes but improves with cues to slow down Gait Pattern: Decreased stride length;Shuffle;Trunk flexed;Wide base of support (genu valgum) General Gait Details: Pt very fearful of falling Stairs: No Wheelchair Mobility Wheelchair Mobility: No       PT Goals (current goals can now be found in the care plan section) Acute Rehab PT Goals Patient Stated Goal: to go home, avoid falling.   PT Goal Formulation: With patient Time For Goal Achievement: 06/05/13 Potential to Achieve Goals: Good  Visit Information  Last PT Received On: 05/28/13 Assistance Needed: +1 History of Present Illness: 79 y.o. female admitted to [facility] on 05/20/13 with PMHx hypertension, dementia, A. fib with RVR presents with her family members from her doctor's office after she was found to be in atrial fibrillation. The family reports that the patient has had increased shortness of breath and has been rubbing her chest and leaning over for the last several days. This is relatively persistent, she has associated swelling in her lower extremities.  Pt diagnosed with new onset CHF.  She has had one fall recently and is extremely anxious about  falling at this point    Subjective Data  Patient Stated Goal: to go home, avoid falling.     Cognition  Cognition Arousal/Alertness: Awake/alert Behavior During Therapy: Anxious Overall Cognitive Status: Impaired/Different from  baseline Area of Impairment: Memory Memory: Decreased short-term memory General Comments: Pt with h/o dementia with confusion noted in hospital.      Balance  Static Standing Balance Static Standing - Balance Support: Bilateral upper extremity supported;During functional activity Static Standing - Level of Assistance: 4: Min assist Static Standing - Comment/# of Minutes: Pt leans fwd & supports herself on countertop of sink while washing hands  End of Session PT - End of Session Equipment Utilized During Treatment: Gait belt Activity Tolerance: Patient tolerated treatment well;Other (comment) (fearful of falling) Patient left: in chair;with call bell/phone within reach Nurse Communication: Mobility status   GP     Lara Mulch 05/28/2013, 11:02 AM    Verdell Face, PTA [phone] 05/28/2013

## 2013-05-28 NOTE — Progress Notes (Signed)
Occupational Therapy Treatment Patient Details Name: Billy Rocco MRN: [id number] DOB: [birth year] Today's Date: 05/28/2013 Time: 4098-1191 OT Time Calculation (min): 33 min  OT Assessment / Plan / Recommendation  History of present illness 79 y.o. female admitted to [facility] on 05/20/13 with PMHx hypertension, dementia, A. fib with RVR presents with her family members from her doctor's office after she was found to be in atrial fibrillation. The family reports that the patient has had increased shortness of breath and has been rubbing her chest and leaning over for the last several days. This is relatively persistent, she has associated swelling in her lower extremities.  Pt diagnosed with new onset CHF.  She has had one fall recently and is extremely anxious about falling at this point   OT comments  Pt with anxiety when up on feet interfering with mobility and ADL.  Will need 24 hour care at home.    Follow Up Recommendations  Home health OT;Supervision/Assistance - 24 hour;Other (comment)    Barriers to Discharge       Equipment Recommendations  3 in 1 bedside comode;Wheelchair (measurements OT);Wheelchair cushion (measurements OT)    Recommendations for Other Services    Frequency Min 2X/week   Progress towards OT Goals Progress towards OT goals: Progressing toward goals  Plan Discharge plan remains appropriate    Precautions / Restrictions Precautions Precautions: Fall Precaution Comments: pt is VERY fearful of falling, gets anxious and displays with DOE with activity due to this   Pertinent Vitals/Pain VSS, no pain    ADL  Grooming: Wash/dry hands;Minimal assistance Where Assessed - Grooming: Supported standing Upper Body Dressing: Minimal assistance Where Assessed - Upper Body Dressing: Unsupported sitting Toilet Transfer: Moderate assistance Toilet Transfer Method: Sit to stand Toilet Transfer Equipment: Regular height toilet;Grab bars Toileting - Clothing Manipulation and  Hygiene: Moderate assistance Where Assessed - Toileting Clothing Manipulation and Hygiene: Standing Equipment Used: Gait belt;Rolling walker Transfers/Ambulation Related to ADLs: min assist to ambulate with RW, verbal cues for posture, hand placement ADL Comments: Pt continues to be limited by fear of falling in standing    OT Diagnosis:    OT Problem List:   OT Treatment Interventions:     OT Goals(current goals can now be found in the care plan section) Acute Rehab OT Goals Patient Stated Goal: to go home, avoid falling.    Visit Information  Last OT Received On: 05/28/13 Assistance Needed: +1 PT/OT Co-Evaluation/Treatment: Yes History of Present Illness: 79 y.o. female admitted to [facility] on 05/20/13 with PMHx hypertension, dementia, A. fib with RVR presents with her family members from her doctor's office after she was found to be in atrial fibrillation. The family reports that the patient has had increased shortness of breath and has been rubbing her chest and leaning over for the last several days. This is relatively persistent, she has associated swelling in her lower extremities.  Pt diagnosed with new onset CHF.  She has had one fall recently and is extremely anxious about falling at this point    Subjective Data      Prior Functioning       Cognition  Cognition Arousal/Alertness: Awake/alert Behavior During Therapy: Anxious Overall Cognitive Status: Impaired/Different from baseline Area of Impairment: Memory Memory: Decreased short-term memory General Comments: Pt with h/o dementia with confusion noted in hospital.      Mobility  Bed Mobility Bed Mobility: Not assessed Transfers Transfers: Sit to Stand;Stand to Sit Sit to Stand: 3: Mod assist;With upper extremity assist;From chair/3-in-1;From  toilet Stand to Sit: 3: Mod assist;With upper extremity assist;To chair/3-in-1;To toilet    Exercises      Balance Static Standing Balance Static Standing - Balance Support:  Bilateral upper extremity supported Static Standing - Level of Assistance: 4: Min assist Static Standing - Comment/# of Minutes: 2   End of Session OT - End of Session Activity Tolerance: Patient tolerated treatment well Patient left: with call bell/phone within reach;in chair  GO     Evern Bio 05/28/2013, 10:18 AM [phone]

## 2013-05-28 NOTE — Progress Notes (Signed)
[facility] PROGRESS NOTE  [name] ZOX:[id number] DOB: 01-06-1928 DOA: 05/20/2013 PCP: [name], DO  HPI/Subjective: No events overnight, patient today reports doing well. She is tolerating by mouth intake denies chest pain or shortness of breath. This afternoon she became hypotensive with systolic blood pressures in the 80s. Patient receiving her Coreg lisinopril and Lasix this morning. Bolused her with normal saline, with subsequent blood pressures improving to systolic blood pressures in the upper 90s. Patient asymptomatic. Family members were present at bedside update on patient's condition.  Assessment/Plan:  Acute systolic CHF -LVEF is 35% with diffuse hypokinesis. -With hypotension this afternoon, I have discontinued ACE inhibitor and beta blockers, having most recent blood pressure of 99/46. I plan to monitor overnight, reassess in a.m. -Cardiology is following, cardiac cath showed single-vessel obstructive disease involving the LAD. -Patient still has bilateral effusion and chest x-ray indicative of mild fluid overload/pulmonary edema.  Hypotension -Hypotension likely secondary to medication induced. She is on Lasix, Coreg, lisinopril. Antihypertensive agents have been held this evening. Will reassess her vitals tomorrow morning. After receiving a bolus of normal saline blood pressures improving with her last reading of 99/46.  Nonischemic cardiomyopathy -Cardiology is following, Cardiac cath showed LVEF of 35% with moderately severe proximal to mid LAD stenosis. -No much change of patient weight since admission. Holding beta blocker and ACE inhibitor therapy for now.  Atrial fibrillation with RVR -Cardiology service following. As I mentioned above beta blocker therapy has been held due to development of hypotension this afternoon. Continue digoxin 0.125 mg by mouth daily.  Hyponatremia -Asymptomatic hyponatremia, likely secondary to CHF and dilutional  hyponatremia. -Check BMP in a.m. Marland Kitchen  Possible pneumonia  -This is treated empirically with antibiotics, patient is on Levaquin and Mucinex. -Possible infiltrates along the right lung base, this is treated as CAP.  Code Status: Full code Family Communication: Plan discussed with the patient. Disposition Plan: Remains inpatient, likely to be discharged in a.m.   Consultants:  Cardiology  Procedures:  Cardiac cath:  Final Conclusions:  1. Single vessel obstructive CAD involving the LAD.  2. Low normal LV systolic function. EF 50%.  3. Low LV filling pressures.  Recommendations: Hold IV diuretics. Optimize beta blocker therapy. If the patient has refractory chest pain despite optimal medical therapy with 2 antianginal agents she could be considered for PCI of the LAD. Otherwise I would treat her with conservative medical therapy.  Antibiotics:  Levaquin started on 8/21  Objective: Filed Vitals:   05/28/13 1300  BP: 99/46  Pulse: 84  Temp: 97.2 F (36.2 C)  Resp: 20    Intake/Output Summary (Last 24 hours) at 05/28/13 1638 Last data filed at 05/28/13 0900  Gross per 24 hour  Intake    480 ml  Output      0 ml  Net    480 ml   Filed Weights   05/26/13 0529 05/27/13 0525 05/28/13 0447  Weight: 50.6 kg (111 lb 8.8 oz) 51.4 kg (113 lb 5.1 oz) 51.9 kg (114 lb 6.7 oz)    Exam: General: Alert and awake, oriented x3, not in any acute distress. HEENT: anicteric sclera, pupils reactive to light and accommodation, EOMI CVS: S1-S2 clear, no murmur rubs or gallops Chest: clear to auscultation bilaterally, no wheezing, rales or rhonchi Abdomen: soft nontender, nondistended, normal bowel sounds, no organomegaly Extremities: no cyanosis, clubbing or edema noted bilaterally Neuro: Cranial nerves II-XII intact, no focal neurological deficits  Data Reviewed: Basic Metabolic Panel:  Recent Labs Lab 05/22/13  1610  05/23/13 0500 05/24/13 0405 05/25/13 0445 05/26/13 0515  05/27/13 0555 05/28/13 0545  NA 132*  --  130*  --  124* 121* 126* 125*  K 3.7  --  3.8  --  3.4* 3.6 3.6 4.2  CL 91*  --  89*  --  86* 85* 90* 90*  CO2 33*  --  32  --  26 27 28 26   GLUCOSE 99  --  89  --  97 94 89 96  BUN 21  --  29*  --  21 21 21  25*  CREATININE 0.77  < > 0.90  --  0.63 0.69 0.67 0.64  CALCIUM 9.0  --  8.9  --  8.6 8.3* 8.3* 8.5  MG 1.9  --  1.9 1.8  --   --   --   --   < > = values in this interval not displayed. Liver Function Tests: No results found for this basename: AST, ALT, ALKPHOS, BILITOT, PROT, ALBUMIN,  in the last 168 hours No results found for this basename: LIPASE, AMYLASE,  in the last 168 hours No results found for this basename: AMMONIA,  in the last 168 hours CBC:  Recent Labs Lab 05/22/13 2030 05/25/13 0445  WBC 9.4 12.0*  HGB 12.2 12.4  HCT 35.4* 35.9*  MCV 86.3 84.3  PLT 357 367   Cardiac Enzymes: No results found for this basename: CKTOTAL, CKMB, CKMBINDEX, TROPONINI,  in the last 168 hours BNP (last 3 results)  Recent Labs  05/20/13 1438 05/20/13 1804  PROBNP 7162.0* 6451.0*   CBG: No results found for this basename: GLUCAP,  in the last 168 hours  Micro Recent Results (from the past 240 hour(s))  CULTURE, BLOOD (ROUTINE X 2)     Status: None   Collection Time    05/20/13  5:30 PM      Result Value Range Status   Specimen Description BLOOD RIGHT ANTECUBITAL   Final   Special Requests BOTTLES DRAWN AEROBIC AND ANAEROBIC 10CC   Final   Culture  Setup Time     Final   Value: 05/21/2013 01:29     Performed at [facility]   Culture     Final   Value: NO GROWTH 5 DAYS     Performed at [facility]   Report Status 05/27/2013 FINAL   Final  CULTURE, BLOOD (ROUTINE X 2)     Status: None   Collection Time    05/20/13  5:45 PM      Result Value Range Status   Specimen Description BLOOD LEFT ANTECUBITAL   Final   Special Requests BOTTLES DRAWN AEROBIC AND ANAEROBIC 10CC   Final   Culture  Setup Time      Final   Value: 05/21/2013 02:12     Performed at [facility]   Culture     Final   Value: NO GROWTH 5 DAYS     Performed at [facility]   Report Status 05/27/2013 FINAL   Final  URINE CULTURE     Status: None   Collection Time    05/21/13  2:30 AM      Result Value Range Status   Specimen Description URINE, CLEAN CATCH   Final   Special Requests NONE   Final   Culture  Setup Time     Final   Value: 05/21/2013 05:30     Performed at [facility] Count  Final   Value: NO GROWTH     Performed at [facility]   Culture     Final   Value: NO GROWTH     Performed at [facility]   Report Status 05/22/2013 FINAL   Final     Studies: No results found.  Scheduled Meds: . aspirin EC  81 mg Oral Daily  . atorvastatin  20 mg Oral q1800  . digoxin  0.125 mg Oral Daily  . donepezil  5 mg Oral QHS  . feeding supplement  237 mL Oral Q24H  . furosemide  20 mg Oral Daily  . guaiFENesin  1,200 mg Oral BID  . heparin  5,000 Units Subcutaneous Q8H  . sodium chloride  3 mL Intravenous Q12H   Continuous Infusions:   Principal Problem:   Congestive heart failure, unspecified Active Problems:   HYPERTENSION   Atrial fibrillation with RVR   Dementia with behavioral disturbance   Leukocytosis, unspecified   Pleural effusion   Hyponatremia   Coronary atherosclerosis of native coronary artery   Non-ischemic cardiomyopathy   Malnutrition    Time spent: 35 minutes    Jeralyn Bennett  [facility] Pager [phone] If 7PM-7AM, please contact night-coverage at [email or web address], password [facility] 05/28/2013, 4:38 PM  LOS: 8 days

## 2013-05-28 NOTE — Progress Notes (Signed)
    SUBJECTIVE: No complaints.   BP 93/53  Pulse 92  Temp(Src) 97.4 F (36.3 C) (Oral)  Resp 18  Ht 5\' 3"  (1.6 m)  Wt 114 lb 6.7 oz (51.9 kg)  BMI 20.27 kg/m2  SpO2 95%  Intake/Output Summary (Last 24 hours) at 05/28/13 1610 Last data filed at 05/27/13 1820  Gross per 24 hour  Intake    720 ml  Output    100 ml  Net    620 ml    PHYSICAL EXAM General: Well developed, well nourished, in no acute distress. Alert and oriented x 3.  Psych:  Good affect, responds appropriately Neck: No JVD. No masses noted.  Lungs: Clear bilaterally with no wheezes or rhonci noted.  Heart: irreg irreg with no murmurs noted. Abdomen: Bowel sounds are present. Soft, non-tender.  Extremities: No lower extremity edema.   LABS: Basic Metabolic Panel:  Recent Labs  96/04/54 0555 05/28/13 0545  NA 126* 125*  K 3.6 4.2  CL 90* 90*  CO2 28 26  GLUCOSE 89 96  BUN 21 25*  CREATININE 0.67 0.64  CALCIUM 8.3* 8.5   Current Meds: . aspirin EC  81 mg Oral Daily  . atorvastatin  20 mg Oral q1800  . carvedilol  3.125 mg Oral BID WC  . digoxin  0.125 mg Oral Daily  . donepezil  5 mg Oral QHS  . feeding supplement  237 mL Oral Q24H  . furosemide  20 mg Oral Daily  . guaiFENesin  1,200 mg Oral BID  . heparin  5,000 Units Subcutaneous Q8H  . levofloxacin  750 mg Oral Q48H  . lisinopril  2.5 mg Oral Daily  . sodium chloride  3 mL Intravenous Q12H    ASSESSMENT AND PLAN:  1. ACUTE ON CHRONIC SYSTOLIC AND DIASTOLIC CHF: Seems to be euvolemic. Continue Lasix 20 mg po Qdaily. BMET is stable. Should be ok for discharge from cardiac standpoint. She can f/u in my office in 2-3 weeks.   2. CAD: Medical management with ASA, beta blocker, statin.   3. ATRIAL FIB: Per previous notes no plans for systemic anticoagulation. Continue ASA. I reviewed her telemetry today and she appears to have reasonable rate control.   4. HYPONATREMIA: Per primary team.    MCALHANY,CHRISTOPHER  8/27/20147:38 AM

## 2013-05-29 DIAGNOSIS — I959 Hypotension, unspecified: Secondary | ICD-10-CM | POA: Diagnosis not present

## 2013-05-29 LAB — BASIC METABOLIC PANEL
CO2: 27 mEq/L (ref 19–32)
Calcium: 8.4 mg/dL (ref 8.4–10.5)
Potassium: 4.2 mEq/L (ref 3.5–5.1)
Sodium: 124 mEq/L — ABNORMAL LOW (ref 135–145)

## 2013-05-29 MED ORDER — ASPIRIN 81 MG PO TBEC: 81.0000 mg | DELAYED_RELEASE_TABLET | Freq: Every day | ORAL | Status: AC

## 2013-05-29 MED ORDER — ATORVASTATIN CALCIUM 20 MG PO TABS: 20.0000 mg | ORAL_TABLET | Freq: Every day | ORAL | Status: DC

## 2013-05-29 MED ORDER — FUROSEMIDE 20 MG PO TABS: 20.0000 mg | ORAL_TABLET | Freq: Every day | ORAL | Status: DC

## 2013-05-29 MED ORDER — CARVEDILOL 3.125 MG PO TABS: 3.1250 mg | ORAL_TABLET | Freq: Two times a day (BID) | ORAL | Status: DC

## 2013-05-29 MED ORDER — DIGOXIN 125 MCG PO TABS: 0.1250 mg | ORAL_TABLET | Freq: Every day | ORAL | Status: DC

## 2013-05-29 NOTE — Progress Notes (Signed)
    SUBJECTIVE: No complaints. Yesterday patient developed hypotension with BP down to 80/50. Coreg and lisinopril were held. Given IV fluid bolus. BP improved today.  BP 95/52  Pulse 91  Temp(Src) 97.8 F (36.6 C) (Oral)  Resp 18  Ht 5\' 3"  (1.6 m)  Wt 116 lb 2.9 oz (52.7 kg)  BMI 20.59 kg/m2  SpO2 97%  Intake/Output Summary (Last 24 hours) at 05/29/13 0957 Last data filed at 05/29/13 0850  Gross per 24 hour  Intake    360 ml  Output    100 ml  Net    260 ml    PHYSICAL EXAM General: Well developed, well nourished, in no acute distress. Alert and oriented x 3.  Psych:  Good affect, responds appropriately Neck: No JVD. No masses noted.  Lungs: Clear bilaterally with no wheezes or rhonci noted.  Heart: irreg irreg with no murmurs noted. Abdomen: Bowel sounds are present. Soft, non-tender.  Extremities: No lower extremity edema.   LABS: Basic Metabolic Panel:  Recent Labs  25/36/64 0545 05/29/13 0510  NA 125* 124*  K 4.2 4.2  CL 90* 90*  CO2 26 27  GLUCOSE 96 87  BUN 25* 24*  CREATININE 0.64 0.72  CALCIUM 8.5 8.4   Current Meds: . aspirin EC  81 mg Oral Daily  . atorvastatin  20 mg Oral q1800  . digoxin  0.125 mg Oral Daily  . donepezil  5 mg Oral QHS  . feeding supplement  237 mL Oral Q24H  . furosemide  20 mg Oral Daily  . guaiFENesin  1,200 mg Oral BID  . heparin  5,000 Units Subcutaneous Q8H  . sodium chloride  3 mL Intravenous Q12H    ASSESSMENT AND PLAN:  1. ACUTE ON CHRONIC SYSTOLIC AND DIASTOLIC CHF: Seems to be euvolemic. Continue Lasix 20 mg po Qdaily. Would hold lisinopril for low BP. Continue carvediolol at 3.125 mg bid. BMET is stable. She is ok for discharge from cardiac standpoint. Will arrange close follow up with Dr. Clifton James in our office.   2. CAD: Medical management with ASA, beta blocker, statin.   3. ATRIAL FIB: Per previous notes no plans for systemic anticoagulation. Continue ASA. I reviewed her telemetry today and she appears to  have reasonable rate control on dig and coreg..   4. HYPONATREMIA: Per primary team.    Judi Cong  8/28/20149:57 AM

## 2013-05-29 NOTE — Progress Notes (Signed)
Patient's IV and telemetry has been discontinued; patient and patient's daughter verbalizes understanding of discharge instructions and patient is being discharged home with daughter. Lorretta Harp RN

## 2013-05-29 NOTE — Discharge Summary (Signed)
Physician Discharge Summary  [name] QMV:[id number] DOB: [birth year] DOA: 05/20/2013  PCP: [name], DO  Admit date: 05/20/2013 Discharge date: 05/29/2013  Time spent: 35 minutes  Recommendations for Outpatient Follow-up:  -Patient to receive home health services, home PT as well as nursing for medication assistance -Patient also instructed to check daily weights as well as blood pressures. As noted, patient hypotensive during this hospitalization which I suspect secondary to antihypertensive agents.  Discharge Diagnoses:  Principal Problem:   Congestive heart failure, unspecified Active Problems:   HYPERTENSION   Atrial fibrillation with RVR   Dementia with behavioral disturbance   Leukocytosis, unspecified   Pleural effusion   Hyponatremia   Coronary atherosclerosis of native coronary artery   Non-ischemic cardiomyopathy   Malnutrition   Hypotension, unspecified   Discharge Condition: Stable  Diet recommendation: Heart healthy diet  Filed Weights   05/27/13 0525 05/28/13 0447 05/29/13 0453  Weight: 51.4 kg (113 lb 5.1 oz) 51.9 kg (114 lb 6.7 oz) 52.7 kg (116 lb 2.9 oz)    History of present illness:  Patient is a 79 y.o.WF PMHx hypertension, dementia, A. fib with RVR presents with her family members from her doctor's office after she was found to be in atrial fibrillation. The family reports that the patient has had increased shortness of breath and has been rubbing her chest and leaning over for the last several days. This is relatively persistent, she has associated swelling in her lower extremities but no coughs fevers nausea vomiting or seizure activity. On my exam the patient has an irregularly irregular rhythm, no obvious murmurs, no JVD but does have bilateral 2+ pitting edema which is symmetrical and goes to the knees. She has scattered quiet rales at the bases but no respiratory distress and is speaking in full sentences. Her EKG shows atrial fibrillation with a  rapid ventricular rate around 120 beats per minute, nonspecific T-wave changes. I agree with the interpretation of the EKG is provided by the resident. I personally seen and interpreted this EKG. She will need lab work, chest x-ray and admission to the hospital. I suspect that she may have a mild amount of congestive heart failure contributing to her arrhythmia. She appears hemodynamically stable. Currently doesn't lady sitting up in the bed in no acute strep distress except when she is asked to move around. Children are present and state patient's weight in July was 106lbs, positive productive cough or disease clear), positive DOE unable to walk from [place] without respiratory distress which is new onset, negative/C./S., Positive questionable chest pain on exertion  Hospital Course:  Patient was admitted to the medicine service for cardiology was consulted. She presented with clinical signs and symptoms suggestive of acute decompensated congestive heart failure. She was initially diuresis with IV Lasix. Her daughter had reported complaints of chest discomfort. Given her ejection fraction of 35%, cardiology recommended a heart cath. Patient undergoing cardiac catheterization on 05/22/2013, procedure performed by Dr. Peter Swazil[place] of interventional cardiology. She was found to have single vessel obstructive coronary artery disease involving the LAD as well as low-normal LV systolic function with EF of 50%. There also low LV pressures. Cardiology recommended holding IV diuretics, and optimizing beta blocker therapy. If there was refectory chest pain, to antianginal agents could be added or patient could be considered for PCI of the LAD. At the present time however cardiology recommended managing conservatively with medical therapy. Patient was started on Coreg as well as lisinopril. Her hospitalization was complicated by the development  of hypotension. On 05/28/2013 discharge had been planned however she became  hypotensive having systolic blood pressure of 80. Because of this I held her Coreg and lisinopril and bolused her with 250 mL's of normal saline. Repeat blood pressures showed improvement through her systolic blood pressures which remained in the 90s. I For overnights for close observation. By the following day her blood pressures maintained in the upper 90s and low 100s. Other issues in this hospitalization included hyponatremia. Her sodium levels becoming as low as 121 however seems stable in the mid 120s. This likely reflects white overload in setting of CHF. She was asymptomatic. With regard to her atrial fibrillation, she was started on digoxin by cardiology. She remained rate controlled on the day of discharge. .  On the day of discharge she was ambulated around her room. I noted her to have an unsteady gait as I felt she would benefit from physical therapy at home. I discussed this with family members who agreed. Family members are reassured me that's she will receive a 24 7 caregiver support. I placed [place] for home health services with home PT as well as home nursing for vital signs checks and daily weights. Patient was discharged in stable condition on 05/29/2013. Please followup on a BMP for sodium levels as well as daily weights and let pressures. Because of her consistently low blood pressures I'm hesitant to discharge her on lisinopril and Coreg at this time. We'll continue her Lasix at 20 mg by mouth daily. Re\re adding beta blocker therapy should be considered on her followup appointment.  Procedures:  Cardiac catheterization, procedure performed on 05/22/2013 by Dr. Peter Swaziland of cardiology. Impressi and low LV filling pressures.on: Single vessel obstructive coronary artery disease involving the LAD, low-normal LV systolic function with EF of 50%  Consultations:  Cardiology: Dr. Peter Swaziland  cardiology: Dr. Shelbie Ammons  Discharge Exam: Filed Vitals:   05/29/13  0453  BP: 95/52  Pulse: 91  Temp: 97.8 F (36.6 C)  Resp: 18    General: Patient is in no acute distress, we helped her ambulate to the bathroom and back, Satoru bedside chair. She reports is doing well this morning and has no complaints  Cardiovascular: Regular rate and rhythm normal S1-S2 no murmurs rubs or gallop  Respiratory: lungs are clear to auscultation bilaterally no wheezing rhonchi or rales normal respiratory effort  Abdomen: Abdomen is soft nontender nondistended positive bowel sounds in all 4 quadrants  Extremities: Extremities no cyanosis clubbing or edema   Discharge Instructions  Discharge Orders   Future Appointments Provider Department Dept Phone   07/11/2013 11:00 AM [facility] [phone]   Future Orders Complete By Expires   (HEART FAILURE PATIENTS) Call MD:  Anytime you have any of the following symptoms: 1) 3 pound weight gain in 24 hours or 5 pounds in 1 week 2) shortness of breath, with or without a dry hacking cough 3) swelling in the hands, feet or stomach 4) if you have to sleep on extra pillows at night in order to breathe.  As directed    Call MD for:  difficulty breathing, headache or visual disturbances  As directed    Call MD for:  extreme fatigue  As directed    Diet - low sodium heart healthy  As directed    Discharge instructions  As directed    Comments:     1) Please check daily blood pressures, record them on a log book and  show this to your cardiologist 2) Also check your daily weights and report these to your cardiologist.  3) Call your doctor if you feel dizzy or lightheaded   Face-to-face encounter (required for Medicare/Medicaid patients)  As directed    Comments:     I Taijon Vink certify that this patient is under my care and that I, or a nurse practitioner or physician's assistant working with me, had a face-to-face encounter that meets the physician face-to-face encounter requirements with this patient  on 05/29/2013. The encounter with the patient was in whole, or in part for the following medical condition(s) which is the primary reason for home health care (List medical condition): Severe deconditioning/Dementia/High Fall Risk   Questions:     The encounter with the patient was in whole, or in part, for the following medical condition, which is the primary reason for home health care:  Severe deconditoning, unsteady gait, high fall risk   I certify that, based on my findings, the following services are medically necessary home health services:  Physical therapy   My clinical findings support the need for the above services:  Cognitive impairments, dementia, or mental confusion  that make it unsafe to leave home   Further, I certify that my clinical findings support that this patient is homebound due to:  Can transfer bed to chair only   Reason for Medically Necessary Home Health Services:  Therapy- Investment banker, operational, Patent examiner   Home Health  As directed    Questions:     To provide the following care/treatments:  PT   Home Health Aide   Increase activity slowly  As directed        Medication List    STOP taking these medications       amLODipine 5 MG tablet  Commonly known as:  NORVASC     hydrochlorothiazide 25 MG tablet  Commonly known as:  HYDRODIURIL     naproxen sodium 220 MG tablet  Commonly known as:  ANAPROX      TAKE these medications       aspirin 81 MG EC tablet  Take 1 tablet (81 mg total) by mouth daily.     atorvastatin 20 MG tablet  Commonly known as:  LIPITOR  Take 1 tablet (20 mg total) by mouth daily at 6 PM.     CENTRUM SILVER ADULT 50+ Tabs  Take 1 tablet by mouth daily.     digoxin 0.125 MG tablet  Commonly known as:  LANOXIN  Take 1 tablet (0.125 mg total) by mouth daily.     donepezil 5 MG tablet  Commonly known as:  ARICEPT  Take 1 tablet (5 mg total) by mouth at bedtime as needed.     furosemide 20 MG tablet   Commonly known as:  LASIX  Take 1 tablet (20 mg total) by mouth daily.     MIRALAX powder  Generic drug:  polyethylene glycol powder  Take 17 g by mouth as needed (constipation).     PROLIA 60 MG/ML Soln injection  Generic drug:  denosumab  Inject 60 mg into the skin every 6 (six) months.       No Known Allergies     Follow-up Information   Follow up with [facility]. (home health nurse and physical therapy)    Contact information:   [phone]      Follow up with [facility], MD In 1 week.   Specialty:  Cardiology   Contact information:  [address] [place] [address] [phone]       Follow up with [name], DO In 1 week.   Specialty:  Family Medicine   Contact information:   [phone] [address] [facility] [address] [place] [address] [phone]        The results of significant diagnostics from this hospitalization (including imaging, microbiology, ancillary and laboratory) are listed below for reference.    Significant Diagnostic Studies: Dg Chest 2 View  05/23/2013   *RADIOLOGY REPORT*  Clinical Data: Bilateral pleural effusions.  CHEST - 2 VIEW  Comparison: 05/21/2013 and 05/20/2013  Findings: Two views of the chest demonstrate bilateral pleural effusions, right side greater than left.  Right pleural effusion is small-moderate in size.  Heart size is stable and within normal limits.  Thoracic aorta is heavily calcified.  There is fluid within the right minor fissure.  Probable atelectasis or consolidation along the medial right lung base.  Few linear densities in the right lower lung are suggestive for atelectasis. There are old right rib fractures.  No evidence for a pneumothorax.  IMPRESSION: Bilateral pleural effusions, right side greater than left. The pleural effusions have minimally changed since 05/20/2013.   Original Report Authenticated By: Richarda Overlie, M.D.   Dg Chest Port 1 View  05/21/2013    *RADIOLOGY REPORT*  Clinical Data: Congestive heart failure  PORTABLE CHEST - 1 VIEW  Comparison: 05/20/2013  Findings: Cardiac enlargement.  Pulmonary vascular congestion with mild edema.  Progression of right pleural effusion.  Small left effusion unchanged.  Bibasilar atelectasis has progressed. Subacute and chronic right rib fractures noted.  IMPRESSION: Progression of congestive heart failure.  Progression of right pleural effusion.   Original Report Authenticated By: Janeece Riggers, M.D.   Dg Chest Portable 1 View  05/20/2013   *RADIOLOGY REPORT*  Clinical Data: Chest pain and short of breath  PORTABLE CHEST - 1 VIEW  Comparison: 01/20/2013  Findings: Bilateral pleural effusions have developed since the prior study.  There is a moderately large effusion on the right and a small effusion on the left.  There are multiple healing right rib fractures. Acute right rib fractures were noted on the prior study. There also are chronic right rib fractures.  No pneumothorax.  There is compressive atelectasis in the right lower lobe.  Negative for heart failure or edema.  IMPRESSION: Moderately large right pleural effusion with right lower lobe atelectasis.  Small left effusion.  Negative for heart failure.  Healing right rib fractures.   Original Report Authenticated By: Janeece Riggers, M.D.    Microbiology: Recent Results (from the past 240 hour(s))  CULTURE, BLOOD (ROUTINE X 2)     Status: None   Collection Time    05/20/13  5:30 PM      Result Value Range Status   Specimen Description BLOOD RIGHT ANTECUBITAL   Final   Special Requests BOTTLES DRAWN AEROBIC AND ANAEROBIC 10CC   Final   Culture  Setup Time     Final   Value: 05/21/2013 01:29     Performed at [facility]   Culture     Final   Value: NO GROWTH 5 DAYS     Performed at [facility]   Report Status 05/27/2013 FINAL   Final  CULTURE, BLOOD (ROUTINE X 2)     Status: None   Collection Time    05/20/13  5:45 PM      Result  Value Range Status  Specimen Description BLOOD LEFT ANTECUBITAL   Final   Special Requests BOTTLES DRAWN AEROBIC AND ANAEROBIC 10CC   Final   Culture  Setup Time     Final   Value: 05/21/2013 02:12     Performed at [facility]   Culture     Final   Value: NO GROWTH 5 DAYS     Performed at [facility]   Report Status 05/27/2013 FINAL   Final  URINE CULTURE     Status: None   Collection Time    05/21/13  2:30 AM      Result Value Range Status   Specimen Description URINE, CLEAN CATCH   Final   Special Requests NONE   Final   Culture  Setup Time     Final   Value: 05/21/2013 05:30     Performed at [facility] Count     Final   Value: NO GROWTH     Performed at [facility]   Culture     Final   Value: NO GROWTH     Performed at [facility]   Report Status 05/22/2013 FINAL   Final     Labs: Basic Metabolic Panel:  Recent Labs Lab 05/23/13 0500 05/24/13 0405 05/25/13 0445 05/26/13 0515 05/27/13 0555 05/28/13 0545 05/29/13 0510  NA 130*  --  124* 121* 126* 125* 124*  K 3.8  --  3.4* 3.6 3.6 4.2 4.2  CL 89*  --  86* 85* 90* 90* 90*  CO2 32  --  26 27 28 26 27   GLUCOSE 89  --  97 94 89 96 87  BUN 29*  --  21 21 21  25* 24*  CREATININE 0.90  --  0.63 0.69 0.67 0.64 0.72  CALCIUM 8.9  --  8.6 8.3* 8.3* 8.5 8.4  MG 1.9 1.8  --   --   --   --   --    Liver Function Tests: No results found for this basename: AST, ALT, ALKPHOS, BILITOT, PROT, ALBUMIN,  in the last 168 hours No results found for this basename: LIPASE, AMYLASE,  in the last 168 hours No results found for this basename: AMMONIA,  in the last 168 hours CBC:  Recent Labs Lab 05/22/13 2030 05/25/13 0445  WBC 9.4 12.0*  HGB 12.2 12.4  HCT 35.4* 35.9*  MCV 86.3 84.3  PLT 357 367   Cardiac Enzymes: No results found for this basename: CKTOTAL, CKMB, CKMBINDEX, TROPONINI,  in the last 168 hours BNP: BNP (last 3 results)  Recent Labs  05/20/13 1438  05/20/13 1804  PROBNP 7162.0* 6451.0*   CBG: No results found for this basename: GLUCAP,  in the last 168 hours     Signed:  Jeralyn Bennett  [facility] 05/29/2013, 7:46 AM

## 2013-05-29 NOTE — Discharge Summary (Signed)
Discharge summary addendum:  His discharge medications with Dr. Swaziland of cardiology, he recommended patient be discharged on Coreg at 3.125 mg by mouth twice a day for which I added this to discharge medications.  Patient given a script for Coreg and will have close followup with cardiology.

## 2013-06-04 ENCOUNTER — Encounter: Payer: Self-pay | Admitting: Cardiovascular Disease

## 2013-06-04 ENCOUNTER — Ambulatory Visit (INDEPENDENT_AMBULATORY_CARE_PROVIDER_SITE_OTHER): Payer: Medicare Other | Admitting: Cardiovascular Disease

## 2013-06-04 VITALS — BP 118/62 | HR 56 | Ht 64.0 in | Wt 120.0 lb

## 2013-06-04 DIAGNOSIS — I4891 Unspecified atrial fibrillation: Secondary | ICD-10-CM

## 2013-06-04 DIAGNOSIS — I5043 Acute on chronic combined systolic (congestive) and diastolic (congestive) heart failure: Secondary | ICD-10-CM

## 2013-06-04 DIAGNOSIS — I2510 Atherosclerotic heart disease of native coronary artery without angina pectoris: Secondary | ICD-10-CM

## 2013-06-04 DIAGNOSIS — I428 Other cardiomyopathies: Secondary | ICD-10-CM

## 2013-06-04 NOTE — Patient Instructions (Addendum)
You have a follow up appt scheduled with Dr. Clifton James for July 17, 2013 at 10:00

## 2013-06-04 NOTE — Progress Notes (Signed)
History of Present Illness: 79 yo female with history of HTN, atrial fibrillation, dementia here today for hospital follow up. She was admitted to [facility] 05/1913 with volume overload, atrial fibrillation with RVR. Echo 05/21/13 with dilated LV cavity, LVEF 35%, mild MR. Cardiac cath 05/22/13 per Dr. Swaziland with 80% proximal LAD stenosis, 30% RCA stenosis. Medical management was recommended. She was diuresed with IV Lasix and was rate controlled with low dose Coreg and Digoxin. Her sodium was low in the hospital but was stable with diuresis. She was not felt to be a good candidate for long term anti-coagulation given advanced age and dementia.   She is here today for hospital follow up. No chest pain or SOB.   Primary Care Physician: Dr. Laury Axon  Last Lipid Profile:Lipid Panel     Component Value Date/Time   CHOL 126 05/23/2013 0500   TRIG 57 05/23/2013 0500   HDL 57 05/23/2013 0500   CHOLHDL 2.2 05/23/2013 0500   VLDL 11 05/23/2013 0500   LDLCALC 58 05/23/2013 0500     Past Medical History  Diagnosis Date  . Hypertension   . Osteoporosis   . Overactive bladder   . Congestive heart failure, unspecified 05/20/2013  . Dysrhythmia     NEW ATRIAL FIBRILATION  . Dementia   . Arthritis     Past Surgical History  Procedure Laterality Date  . Abdominal hysterectomy      TAH BSO  . Spine surgery  1980    Lumbar surgery    Current Outpatient Prescriptions  Medication Sig Dispense Refill  . aspirin EC 81 MG EC tablet Take 1 tablet (81 mg total) by mouth daily.  30 tablet  0  . atorvastatin (LIPITOR) 20 MG tablet Take 1 tablet (20 mg total) by mouth daily at 6 PM.  30 tablet  0  . carvedilol (COREG) 3.125 MG tablet Take 1 tablet (3.125 mg total) by mouth 2 (two) times daily with a meal.  60 tablet  0  . denosumab (PROLIA) 60 MG/ML SOLN Inject 60 mg into the skin every 6 (six) months.       . digoxin (LANOXIN) 0.125 MG tablet Take 1 tablet (0.125 mg total) by mouth daily.  30 tablet  0  .  donepezil (ARICEPT) 5 MG tablet Take 1 tablet (5 mg total) by mouth at bedtime as needed.  30 tablet  5  . furosemide (LASIX) 20 MG tablet Take 1 tablet (20 mg total) by mouth daily.  30 tablet  0  . Multiple Vitamins-Minerals (CENTRUM SILVER ADULT 50+) TABS Take 1 tablet by mouth daily.      . polyethylene glycol powder (MIRALAX) powder Take 17 g by mouth as needed (constipation).        No current facility-administered medications for this visit.    No Known Allergies  History   Social History  . Marital Status: Married    Spouse Name: N/A    Number of Children: N/A  . Years of Education: N/A   Occupational History  . retired     Engineer, civil (consulting)  . volunteers at hospice    Social History Main Topics  . Smoking status: Never Smoker   . Smokeless tobacco: Never Used  . Alcohol Use: No  . Drug Use: No  . Sexual Activity: No   Other Topics Concern  . Not on file   Social History Narrative  . No narrative on file    Family History  Problem Relation Age of Onset  .  Depression    . Heart attack    . Coronary artery disease    . Coronary artery disease Mother 39  . Heart attack Son 87    Review of Systems:  As stated in the HPI and otherwise negative.   BP 118/62  Pulse 56  Ht 5\' 4"  (1.626 m)  Wt 120 lb (54.432 kg)  BMI 20.59 kg/m2  Physical Examination: General: Well developed, well nourished, NAD HEENT: OP clear, mucus membranes moist SKIN: warm, dry. No rashes. Neuro: No focal deficits Musculoskeletal: Muscle strength 5/5 all ext Psychiatric: Mood and affect normal Neck: No JVD, no carotid bruits, no thyromegaly, no lymphadenopathy. Lungs:Clear bilaterally, no wheezes, rhonci, crackles Cardiovascular: Regular rate and rhythm. No murmurs, gallops or rubs. Abdomen:Soft. Bowel sounds present. Non-tender.  Extremities: No lower extremity edema. Pulses are 2 + in the bilateral DP/PT.  EKG:  Echo 05/21/13:  Left ventricle: The cavity size was mildly dilated.  Wall thickness was increased in a pattern of mild LVH. The estimated ejection fraction was 35%. Diffuse hypokinesis. - Mitral valve: Mild regurgitation. - Atrial septum: No defect or patent foramen ovale was Identified.  Cardiac cath 05/22/13: Cardiac cath 05/22/13:  Left mainstem: Normal  Left anterior descending (LAD): The LAD is moderately calcified. There is an 80% stenosis in the proximal to mid vessel. The distal LAD appears relatively small.  There is a large ramus intermediate branch that trifurcates on the lateral wall. It is normal.  Left circumflex (LCx): The left circumflex is a tiny vessel that terminates early in the AV groove.  Right coronary artery (RCA): The RCA is a large dominant vessel. It gives rise to 3 posterolateral branches and the PDA. There is 30% disease in the proximal RCA. There is 30-40% disease in the proximal PDA.  Left ventriculography: Left ventricular systolic function is abnormal, LVEF is estimated at 50%, there is mild global hypokinesis.There is no significant mitral regurgitation  Final Conclusions:  1. Single vessel obstructive CAD involving the LAD.  2. Low normal LV systolic function. EF 50%.  3. Low LV filling pressures.   Assessment and Plan:   1. CAD: Stable. No angina.   2. Atrial fibrillation: Rate controlled on Coreg and digoxin.   3. Acute and Chronic systolic CHF: LVEF is 35%. Medical management. Volume status is ok today. Continue Lasix. 1.5 liter fluid restriction. Follow daily weights.   4. Non-ischemic Cardiomyopathy: Medical management  4. Hyponatremia: Will repeat BMET today.

## 2013-06-05 ENCOUNTER — Encounter: Payer: Self-pay | Admitting: Family Medicine

## 2013-06-05 ENCOUNTER — Ambulatory Visit (INDEPENDENT_AMBULATORY_CARE_PROVIDER_SITE_OTHER): Payer: Medicare Other | Admitting: Family Medicine

## 2013-06-05 VITALS — BP 96/60 | HR 63 | Temp 97.5°F | Wt 126.7 lb

## 2013-06-05 DIAGNOSIS — I10 Essential (primary) hypertension: Secondary | ICD-10-CM

## 2013-06-05 DIAGNOSIS — E871 Hypo-osmolality and hyponatremia: Secondary | ICD-10-CM

## 2013-06-05 DIAGNOSIS — I5043 Acute on chronic combined systolic (congestive) and diastolic (congestive) heart failure: Secondary | ICD-10-CM

## 2013-06-05 DIAGNOSIS — I509 Heart failure, unspecified: Secondary | ICD-10-CM

## 2013-06-05 DIAGNOSIS — E785 Hyperlipidemia, unspecified: Secondary | ICD-10-CM

## 2013-06-05 LAB — BASIC METABOLIC PANEL
Chloride: 97 mEq/L (ref 96–112)
GFR: 105.38 mL/min (ref >60.00)
Glucose, Bld: 83 mg/dL (ref 70–99)
Potassium: 5.3 mEq/L — ABNORMAL HIGH (ref 3.5–5.1)
Sodium: 128 mEq/L — ABNORMAL LOW (ref 135–145)

## 2013-06-05 NOTE — Assessment & Plan Note (Signed)
Stable Cont meds 

## 2013-06-05 NOTE — Assessment & Plan Note (Signed)
Labs pending.  

## 2013-06-05 NOTE — Patient Instructions (Signed)
Heart Failure  Heart failure is a condition in which the heart has trouble pumping blood. This means your heart does not pump blood efficiently for your body to work well. In some cases of heart failure, fluid may back up into your lungs or you may have swelling (edema) in your lower legs. Heart failure is a long-term (chronic) condition. It is important for you to take good care of yourself and follow your caregiver's treatment plan.  CAUSES    Health conditions:   High blood pressure (hypertension) causes the heart muscle to work harder than normal. When pressure in the blood vessels is high, the heart needs to pump (contract) with more force in order to circulate blood throughout the body. High blood pressure eventually causes the heart to become stiff and weak.   Coronary artery disease (CAD) is the buildup of cholesterol and fat (plaque) in the arteries of the heart. The blockage in the arteries deprives the heart muscle of oxygen and blood. This can cause chest pain and may lead to a heart attack. High blood pressure can also contribute to CAD.   Heart attack (myocardial infarction) occurs when 1 or more arteries in the heart become blocked. The loss of oxygen damages the muscle tissue of the heart. When this happens, part of the heart muscle dies. The injured tissue does not contract as well and weakens the heart's ability to pump blood.   Abnormal heart valves can cause heart failure when the heart valves do not open and close properly. This makes the heart muscle pump harder to keep the blood flowing.   Heart muscle disease (cardiomyopathy or myocarditis) is damage to the heart muscle from a variety of causes. These can include drug or alcohol abuse, infections, or unknown reasons. These can increase the risk of heart failure.   Lung disease makes the heart work harder because the lungs do not work properly. This can cause a strain on the heart, leading it to fail.    Diabetes increases the risk of heart failure. High blood sugar contributes to high fat (lipid) levels in the blood. Diabetes can also cause slow damage to tiny blood vessels that carry important nutrients to the heart muscle. When the heart does not get enough oxygen and food, it can cause the heart to become weak and stiff. This leads to a heart that does not contract efficiently.   Other diseases can contribute to heart failure. These include abnormal heart rhythms, thyroid problems, and low blood counts (anemia).   Unhealthy behaviors:   Being overweight.   Smoking or chewing tobacco.   Eating foods high in fat and cholesterol.   Eating foods or drinking beverages high in salt.   Abusing drugs or alcohol.   Lacking physical activity.  SYMPTOMS   Heart failure symptoms may vary and can be hard to detect. Symptoms may include:   Shortness of breath with activity, such as climbing stairs.   Persistent cough.   Swelling of the feet, ankles, legs, or abdomen.   Unexplained, sudden weight gain.   Difficulty breathing when lying flat (orthopnea).   Waking from sleep because of the need to sit up and get more air.   Rapid heartbeat.   Fatigue and loss of energy.   Feeling lightheaded, dizzy, or close to fainting.   Loss of appetite.   Nausea.   Increased urination during the night (nocturia).  DIAGNOSIS   A diagnosis of heart failure is based on your history, symptoms, physical   examination, and diagnostic tests.  Diagnostic tests for heart failure may include:   Electrocardiography.   Chest X-ray.   Blood tests.   Exercise stress test.   Echocardiography.   Cardiac angiography.   Radionuclide scans.  TREATMENT   Treatment is aimed at managing the symptoms of heart failure. Medicines, behavioral changes, or surgical intervention may be necessary to treat heart failure.   Medicines to help treat heart failure may include:    Angiotensin-converting enzyme (ACE) inhibitors. This type of medicine blocks the effects of a blood protein called angiotensin-converting enzyme. ACE inhibitors relax (dilate) the blood vessels and help lower blood pressure.   Angiotensin receptor blockers. This type of medicine blocks the actions of a blood protein called angiotensin. Angiotensin receptor blockers dilate the blood vessels and help lower blood pressure.   Aldosterone antagonists. This type of medicine helps rid your body of extra fluid. This loss of extra fluid lowers the volume of blood the heart pumps.   Water pills (diuretics). Diuretics cause the kidneys to remove salt and water from the blood. The extra fluid is removed through urination.   Beta blockers. These prevent the heart from beating too fast and improve heart muscle strength.   Digitalis. This increases the force of the heartbeat.   Healthy behavior changes include:   Obtaining and maintaining a healthy weight.   Stopping smoking or chewing tobacco.   Eating heart healthy foods.   Limiting or avoiding alcohol.   Stopping drug use.   Becoming as physically active as possible.   Surgical treatment for heart failure may include:   A procedure to open blocked arteries, repair damaged heart valves, or remove damaged heart muscle tissue.   A pacemaker to improve heart muscle function and control certain abnormal heart rhythms.   An internal cardioverter defibrillator to possibly prevent sudden cardiac death.   A left ventricular assist device to assist the pumping ability of the heart.  HOME CARE INSTRUCTIONS    Take your medicine as directed by your caregiver. Medicines are important in reducing the workload of your heart, slowing the progression of heart failure, and improving your symptoms.   Do not stop taking your medicine unless directed by your caregiver.   Do not skip any dose of medicine.    Refill your prescriptions before you run out of medicine. Your medicines are needed every day.   Take over-the-counter medicine only as directed by your caregiver or pharmacist.   Stay physically active. Your caregiver or cardiac rehabilitation program can help determine your level of physical activity. It is important to follow your specific physical activity program. Regular physical activity can control weight and improve your energy, endurance, health, cholesterol levels, mood, and nighttime sleep. It is important to pace your physical activity to avoid shortness of breath or chest pain. It is recommended to rest for 1 hour before and after meals.   Eat heart healthy foods. Food choices should be low in saturated fat, trans fat, cholesterol, and salt (sodium). Healthy choices include fresh or frozen fruits and vegetables, fish, lean meats, legumes, fat-free or low-fat dairy products, and whole grain or high fiber foods. Limit your sodium to 1500 milligrams (mg) each day or as recommended by your caregiver. Talk to a dietitian to learn more about heart healthy foods and healthy seasonings.   Use healthy cooking methods. Healthy cooking methods include roasting, grilling, broiling, baking, poaching, steaming, or stir-frying. Talk to a dietitian to learn more about healthy cooking methods.     Limit fluids if directed by your caregiver. Fluid restriction may reduce symptoms of heart failure in some individuals.   Weigh yourself every day. Daily weights are important in the early recognition of excess fluid. You should weigh yourself every morning after you urinate and before you eat breakfast. Wear the same amount of clothing each time you weigh yourself. Record your daily weight. Provide your caregiver with your weight record.   Monitor and record your blood pressure if directed by your caregiver.   Check your pulse if directed by your caregiver.    If you are overweight, it is time to lose and maintain a healthy weight.   Stop smoking or chewing tobacco. Nicotine makes your heart work harder by causing your blood vessels to constrict. Do not use nicotine gum or patches before talking to your caregiver.   Schedule and attend follow-up visits as directed by your caregiver. It is important to keep all your appointments.   Limit alcohol intake to no more than 1 drink per day for nonpregnant women and 2 drinks per day for men. Drinking more than that is harmful to your heart. Tell your caregiver if you drink alcohol several times a week. Talk with your caregiver about whether alcohol is safe for you. If your heart has already been damaged by alcohol or you have severe heart failure, drinking alcohol should be stopped completely.   Stop illegal drug use.   Stay up-to-date with immunizations. It is especially important to prevent respiratory infections through current pneumococcal and influenza immunizations.   Manage other health conditions such as hypertension, diabetes, thyroid disease, or abnormal heart rhythms as directed by your caregiver.   Learn to manage stress.   Plan rest periods when fatigued.   Learn strategies to manage high temperatures. If the weather is extremely hot:   Avoid vigorous physical activity.   Use air conditioning or fans or seek a cooler location.   Avoid caffeine and alcohol.   Wear loose-fitting, lightweight, and light-colored clothing.   Learn strategies to manage cold temperatures. If the weather is extremely cold:   Avoid vigorous physical activity.   Layer clothes.   Wear mittens or gloves, a hat, and a scarf when going outside.   Avoid alcohol.   Obtain ongoing education and support as needed.   Participate or seek rehabilitation as needed to maintain or improve independence and quality of life.  SEEK MEDICAL CARE IF:    Your weight increases by 3 lb/1.4 kg in 1 day or 5 lb/2.3 kg in a week.    You have increasing shortness of breath that is unusual for you.   You are unable to participate in your usual physical activities.   You tire easily.   You cough more than normal, especially with physical activity.   You have any or more swelling in areas such as your hands, feet, ankles, or abdomen.   You are unable to sleep because it is hard to breathe.   You cough up bloody mucus (sputum).   You feel like your heart is beating fast (palpitations).   You become dizzy or lightheaded upon standing up.  SEEK IMMEDIATE MEDICAL CARE IF:    You have difficulty breathing.   There is a change in mental status such as decreased alertness or difficulty with concentration.   You have a pain or discomfort in your chest.   You have an episode of fainting (syncope).   You are in an area of high temperatures and   you have signs of heat exhaustion:   Heavy sweating.   Muscle cramps.   Weakness.   Dizziness.   Headaches.   Fainting.   You are in an area of cold temperatures and you have signs of hypothermia:   Clumsiness.   Confusion.   Sleepiness.   Shivering.  MAKE SURE YOU:    Understand these instructions.   Will watch your condition.   Will get help right away if you are not doing well or get worse.  Document Released: 09/18/2005 Document Revised: 09/04/2012 Document Reviewed: 04/18/2012  [employer] Patient Information 2014 [employer].

## 2013-06-05 NOTE — Progress Notes (Signed)
  Subjective:    Patient ID: [name], female    DOB: 17-Apr-1928, 79 y.o.   MRN: [id number]  HPI Pt here with her son f/u from hospitalization for chf.  Pt was d/c with coreg and digoxin.  She is feeling much better. She saw cardiology yesterday and labs were done. Pt has 24 hour care and PT coming to house. Pt is a lot less sob , no cp.  Review of Systems As above    Objective:   Physical Exam  BP 96/60  Pulse 63  Temp(Src) 97.5 F (36.4 C) (Oral)  Wt 126 lb 11.2 oz (57.471 kg)  BMI 21.74 kg/m2  SpO2 95% General appearance: alert, cooperative, appears stated age and no distress Throat: lips, mucosa, and tongue normal; teeth and gums normal Neck: no adenopathy, supple, symmetrical, trachea midline and thyroid not enlarged, symmetric, no tenderness/mass/nodules Lungs: clear to auscultation bilaterally Heart: S1, S2 normal Extremities: edema +1 pitting edema      Assessment & Plan:

## 2013-06-05 NOTE — Assessment & Plan Note (Signed)
con't meds and fluid restriction F/u cardiology

## 2013-06-06 ENCOUNTER — Telehealth: Payer: Self-pay

## 2013-06-06 MED ORDER — OXYBUTYNIN CHLORIDE ER 5 MG PO TB24: ORAL_TABLET | ORAL | Status: DC

## 2013-06-06 NOTE — Telephone Encounter (Signed)
Call from [place] with [facility] who is requesting the patient be put back on medication for incontinence, she said the patient is using the bathroom every 30 min's-1 hour. OK per Dr.Lowne. Rx called in.        KP

## 2013-06-13 DIAGNOSIS — I4891 Unspecified atrial fibrillation: Secondary | ICD-10-CM

## 2013-06-13 DIAGNOSIS — I428 Other cardiomyopathies: Secondary | ICD-10-CM

## 2013-06-13 DIAGNOSIS — I509 Heart failure, unspecified: Secondary | ICD-10-CM

## 2013-06-13 DIAGNOSIS — I2510 Atherosclerotic heart disease of native coronary artery without angina pectoris: Secondary | ICD-10-CM

## 2013-06-16 ENCOUNTER — Telehealth: Payer: Self-pay | Admitting: Cardiovascular Disease

## 2013-06-16 MED ORDER — FUROSEMIDE 20 MG PO TABS: 20.0000 mg | ORAL_TABLET | Freq: Every day | ORAL | Status: DC

## 2013-06-16 NOTE — Telephone Encounter (Signed)
Legs are swollen and having some sob.  Call office over the weekend x 3 with no return call.

## 2013-06-16 NOTE — Telephone Encounter (Signed)
Spoke with patient's son [name] who states yesterday he noted a stain on patient's white TED hose and discovered that patient had blister on leg that was weeping.  He states yesterday patient was not SOB at that time.  [name] reports that today patient is at home with a sitter who states that patient's ankles are noticebly swollen and patient is wheezing.  He does not know if the patient is SOB today.  [name] reports that he and his sister called the office 3 times this weekend with no call back.  I apologized to him and will report to Doylene Bode, RN, Transport planner.  I advised patient's son that I would discuss with Dr. Clifton James who is in the office today and call him back.  Dr. Clifton James asked me to route message to him and to Charlotte Crumb, RN his primary nurse and that they will contact the patient.

## 2013-06-16 NOTE — Telephone Encounter (Signed)
She is taking Lasix 20 mg per day. Would have her take 40 mg BID for 2 days and follow weight. Call us with weight and symptoms on Wednesday. cdm

## 2013-06-16 NOTE — Telephone Encounter (Signed)
Spoke with pt's son and gave him instructions from Dr. Clifton James. They are trying to weigh pt every AM but are having trouble getting accurate weight due to pt being unsteady.

## 2013-06-18 ENCOUNTER — Telehealth: Payer: Self-pay | Admitting: Cardiovascular Disease

## 2013-06-18 ENCOUNTER — Encounter: Payer: Self-pay | Admitting: Cardiovascular Disease

## 2013-06-18 NOTE — Telephone Encounter (Signed)
Spoke with pt's son. He reports pt 's weight is down about 2 lbs since Monday. Wt Monday was 108.5 lbs and today it is 106.8 lbs.  Edema is gone. No complaints of shortness of breath. BP 127/74, pulse 87. Pt  constipated and feeling "wiped out". Pt is using Miralax for constipation. Reviewed with Dr. Clifton James and pt should return to normal furosemide dose but can take extra dose as need for swelling or weight gain. Pt's son aware of instructions and will call us if pt has to use prn furosemide frequently.  Will ask Home Health Nurse (per son Selena Batten at [facility]) to draw BMP at next visit.  I spoke with [facility] at [facility]. She is seeing pt today and will draw BMP at this visit.

## 2013-06-18 NOTE — Telephone Encounter (Signed)
New problem  [name], Mrs.Scurlock son would like for you to call him,re:how his mother is doing.

## 2013-06-18 NOTE — Telephone Encounter (Signed)
See phone note dated 9/15 for documentation related to this call.

## 2013-06-20 ENCOUNTER — Other Ambulatory Visit (HOSPITAL_COMMUNITY): Payer: Self-pay | Admitting: Cardiovascular Disease

## 2013-06-25 ENCOUNTER — Telehealth: Payer: Self-pay | Admitting: General Practice

## 2013-06-25 ENCOUNTER — Telehealth: Payer: Self-pay | Admitting: *Deleted

## 2013-06-25 MED ORDER — QUETIAPINE FUMARATE 50 MG PO TABS: 50.0000 mg | ORAL_TABLET | Freq: Every day | ORAL | Status: DC

## 2013-06-25 NOTE — Telephone Encounter (Signed)
Received call from [facility] nurse from [facility]. Selena Batten is asking for order for pt to have mobile chest X-ray. This can be done at pt's home. Kim reports pt has crackles in the bases and shortness of breath. Also not sleeping well and having panic attacks. Kim reports EMS was called last night for panic attack. They are unable to get accurate weight as pt is unsteady on feet. Swelling has not increased. Reviewed with Dr. Clifton James and OK for pt to have chest X-ray. Verbal order given to [facility].

## 2013-06-25 NOTE — Telephone Encounter (Signed)
seroquel 50 mg #30  1 po qhs

## 2013-06-25 NOTE — Telephone Encounter (Signed)
[name] has been made aware that the Rx has been sent and if any problems she can call the office.       KP

## 2013-06-25 NOTE — Telephone Encounter (Signed)
Pt daughter called stating that pt has severe dementia and has not been sleeping more than 15 minute cat naps throughout the day. Nothing consistent or more than that. Daughter is wondering if there is something that we can call in for her mom.   [facility] prefers [facility] [address].

## 2013-06-27 ENCOUNTER — Telehealth: Payer: Self-pay | Admitting: Cardiovascular Disease

## 2013-06-27 ENCOUNTER — Telehealth (INDEPENDENT_AMBULATORY_CARE_PROVIDER_SITE_OTHER): Payer: PRIVATE HEALTH INSURANCE | Admitting: *Deleted

## 2013-06-27 DIAGNOSIS — J90 Pleural effusion, not elsewhere classified: Secondary | ICD-10-CM

## 2013-06-27 DIAGNOSIS — F0391 Unspecified dementia with behavioral disturbance: Secondary | ICD-10-CM

## 2013-06-27 NOTE — Telephone Encounter (Signed)
Spoke with patient and she said her mother had a portable Cxr on Wednesday and wanted to know the results and mother has declined the hospital bed at this time.  We do not have any results at this time, and she is not aware who came to the home to do the Xray. I made her aware i would call on Monday to [facility] to see if they done it so we can result it.   KP

## 2013-06-27 NOTE — Telephone Encounter (Signed)
Spoke with Selena Batten with [facility] who asked if Dr. Clifton James has received chest xray results.  I advised that Dr. Clifton James is not in the office today but I did check his mailbox for results.  I advised Selena Batten that the results should be faxed to the office as we are not the physician that should read the results.  Kim verbalized understanding and gave me the phone number for the company that should send the results ([employer]) [phone] if we should need it.

## 2013-06-27 NOTE — Telephone Encounter (Signed)
Please advise      KP 

## 2013-06-27 NOTE — Telephone Encounter (Signed)
New problem   Want to know results of pt's cxr that was done few days ago. Please call Selena Batten

## 2013-06-27 NOTE — Telephone Encounter (Signed)
Spoke with Selena Batten form [facility] who is requesting a hospital bed for this patient. Please fax order to [facility] at [phone].

## 2013-06-27 NOTE — Telephone Encounter (Signed)
Ok to order hospital bed

## 2013-06-28 ENCOUNTER — Telehealth: Payer: Self-pay | Admitting: Physician Assistant

## 2013-06-28 NOTE — Telephone Encounter (Signed)
[name] is a 79 y.o. female with hx of NICM, systolic CHF, CAD, AFib, dementia. Her daughter, [name] (a Armed forces technical officer) called with concerns of increasing dyspnea and LE edema.  She cannot stand so we do not have a weight on her. Patient takes Lasix 20 mg QD.  Her daughter gave her a total of Lasix 40 mg today. She would like to avoid bringing her to the hospital if possible. I advised her to monitor her today. If her urine output does not increase and she seems to worsen over the day, she should take her to the ED. If her UOP increases and she seems to improve, she should give her a dose of Lasix 40 mg tomorrow and restart Lasix 20 mg QD on Monday. I will have the office contact her for a follow up in the next several days (if she is not admitted to the hospital).   Signed,  Tereso Newcomer, PA-C   06/28/2013 10:09 AM

## 2013-06-28 NOTE — Telephone Encounter (Signed)
We did not order it.  Whoever ordered it would get the results.

## 2013-06-30 NOTE — Telephone Encounter (Signed)
See other note from today. [name] will let her brother know about the results of the chest xray report.

## 2013-06-30 NOTE — Telephone Encounter (Signed)
spoke with [name] and he stated that they wanted to get the hospital bed and also a lift chair per Kim's request at [facility].    KP   I called Kim at [facility] [phone] to get more details so I can fax Hospital bed order.   KP

## 2013-06-30 NOTE — Telephone Encounter (Addendum)
I spoke with patient's daughter Nicholos Johns [phone] and she advised me that Mrs. Giovanelli is doing much bettter in regard to her fluid status. Today she brought the lasix back down to 20mg . She will call us if her status changes. Left message for Selena Batten at [facility] [phone] and asked where is chest xray report?

## 2013-06-30 NOTE — Telephone Encounter (Signed)
Discussed with Selena Batten and she requested Hospital bed and Lift chair. Orders faxed to [facility] at [phone].     KP

## 2013-06-30 NOTE — Telephone Encounter (Signed)
New problem    Pt's son want to know results of pt's cxr she had done last week.

## 2013-06-30 NOTE — Telephone Encounter (Signed)
Number for Xray report is incorrect. LM with Kim at [facility] [phone] to call me with report.

## 2013-06-30 NOTE — Telephone Encounter (Addendum)
Received portable chest XRAY report from 9/24 that showed moderate to severe pulmonary vascular congestion. Reviewed with Dr.Nelson (DOD) and she advised that patient continue with Lasix 40mg  every day until she is seen on 10/3. [name] (patient's daughter) is aware of this treatment plan.

## 2013-06-30 NOTE — Addendum Note (Signed)
Addended by: Arnette Norris on: 06/30/2013 05:00 PM   Modules accepted: Orders

## 2013-06-30 NOTE — Addendum Note (Signed)
Addended by: Arnette Norris on: 06/30/2013 03:31 PM   Modules accepted: Orders

## 2013-06-30 NOTE — Telephone Encounter (Signed)
Can we check on her today to see how she is doing? She may need to continue Lasix 40 mg daily if this helped. Her home health nurse arranged a chest x-ray last week but I do not see the results of the chest x-ray as it was not done in our system. Thayer Ohm

## 2013-07-04 ENCOUNTER — Telehealth: Payer: Self-pay | Admitting: Cardiology

## 2013-07-04 ENCOUNTER — Encounter: Payer: Self-pay | Admitting: Physician Assistant

## 2013-07-04 ENCOUNTER — Ambulatory Visit (INDEPENDENT_AMBULATORY_CARE_PROVIDER_SITE_OTHER): Payer: Medicare Other | Admitting: Physician Assistant

## 2013-07-04 VITALS — BP 140/80 | HR 68 | Ht 64.0 in | Wt 123.0 lb

## 2013-07-04 DIAGNOSIS — E871 Hypo-osmolality and hyponatremia: Secondary | ICD-10-CM

## 2013-07-04 DIAGNOSIS — I4891 Unspecified atrial fibrillation: Secondary | ICD-10-CM

## 2013-07-04 DIAGNOSIS — I5022 Chronic systolic (congestive) heart failure: Secondary | ICD-10-CM

## 2013-07-04 DIAGNOSIS — I2510 Atherosclerotic heart disease of native coronary artery without angina pectoris: Secondary | ICD-10-CM

## 2013-07-04 LAB — BASIC METABOLIC PANEL
CO2: 28 mEq/L (ref 19–32)
Calcium: 8.4 mg/dL (ref 8.4–10.5)
Creatinine, Ser: 0.6 mg/dL (ref 0.4–1.2)
GFR: 105.36 mL/min (ref >60.00)
Potassium: 4.3 mEq/L (ref 3.5–5.1)

## 2013-07-04 MED ORDER — ATORVASTATIN CALCIUM 20 MG PO TABS: ORAL_TABLET | ORAL | Status: DC

## 2013-07-04 MED ORDER — FUROSEMIDE 20 MG PO TABS: ORAL_TABLET | ORAL | Status: DC

## 2013-07-04 NOTE — Telephone Encounter (Signed)
New Problem  Pt states her BP is higher than normal.. No readings.Irving Burton NP or PA.Marland Kitchen Request an appt with [facility]. Please call back to discuss.

## 2013-07-04 NOTE — Progress Notes (Signed)
[address], [address] [place], [place]  [address] Phone: [phone] Fax:  [phone]  Date:  07/04/2013   ID:  [name], DOB 1928/08/28, MRN [id number]  PCP:  Deborah Freud, DO  Cardiologist:  Dr. [name]      History of Present Illness: [name] is a 79 y.o. female who returns for follow up today.   She has a hx of HTN, atrial fibrillation, dementia. She was admitted to [facility] 05/1913 with volume overload, atrial fibrillation with RVR. Echo 05/21/13 with dilated LV cavity, LVEF 35%, mild MR. Cardiac cath 05/22/13 per Dr. Swaziland with 80% proximal LAD stenosis, 30% RCA stenosis. Medical management was recommended. She was diuresed with IV Lasix and was rate controlled with low dose Coreg and Digoxin. Her sodium was low in the hospital but was stable with diuresis. She was not felt to be a good candidate for long term anti-coagulation given advanced age and dementia.  She was seen in followup by Dr. [name] 06/04/13.  Her daughter called in on 9/27 with reports of worsening dyspnea and edema. Her Lasix was increased. Followup chest x-ray arranged through home health prior to this had demonstrated moderate to severe pulmonary vascular congestion. Increased dose of Lasix was continued.  She is here with her son today who helps with the hx.  She has noted improvement in her breathing and LE edema with the higher dose of Lasix.  She denies CP.  No syncope.  No orthopnea, PND.  She has significant anxiety related to her dementia.  She is sometimes more fitful at night and often awakens.    Labs (8/14):  Na 124, K 4.2, Cr 0.72, Hgb 12.4, TSH 1.013 Labs (9/14):  Na 126, K 4.9, Cr 0.64  Wt Readings from Last 3 Encounters:  07/04/13 123 lb (55.792 kg)  06/05/13 126 lb 11.2 oz (57.471 kg)  06/04/13 120 lb (54.432 kg)     Past Medical History  Diagnosis Date  . Hypertension   . Osteoporosis   . Overactive bladder   . Chronic systolic heart failure 05/20/2013    a.  Echo (8/14):  mild LVH, EF 35%, diff HK, mild MR  . Atrial fibrillation     not a coumadin candidate  . Dementia   . Arthritis   . CAD (coronary artery disease)     a. LHC (8/14):  prox to mid LAD 80, pRCA 30, pPDA 30-40, EF 50%  . Hyponatremia   . NICM (nonischemic cardiomyopathy)     Current Outpatient Prescriptions  Medication Sig Dispense Refill  . Acetaminophen (TYLENOL PO) Take by mouth as needed.      Marland Kitchen aspirin EC 81 MG EC tablet Take 1 tablet (81 mg total) by mouth daily.  30 tablet  0  . atorvastatin (LIPITOR) 20 MG tablet TAKE 1 TABLET EVERY DAY AT 6PM  30 tablet  0  . carvedilol (COREG) 3.125 MG tablet TAKE 1 TABLET (3.125 MG TOTAL) BY MOUTH 2 (TWO) TIMES DAILY WITH A MEAL.  60 tablet  0  . denosumab (PROLIA) 60 MG/ML SOLN Inject 60 mg into the skin every 6 (six) months.       . digoxin (LANOXIN) 0.125 MG tablet TAKE 1 TABLET EVERY DAY  30 tablet  0  . donepezil (ARICEPT) 5 MG tablet Take 1 tablet (5 mg total) by mouth at bedtime as needed.  30 tablet  5  . furosemide (LASIX) 20 MG tablet Take 1 tablet (20 mg total) by  mouth daily.  30 tablet  6  . Multiple Vitamins-Minerals (CENTRUM SILVER ADULT 50+) TABS Take 1 tablet by mouth daily.      Marland Kitchen oxybutynin (DITROPAN-XL) 5 MG 24 hr tablet TAKE 1 TABLET BY MOUTH EVERY DAY  30 tablet  5  . polyethylene glycol powder (MIRALAX) powder Take 17 g by mouth as needed (constipation).       . QUEtiapine (SEROQUEL) 50 MG tablet Take 1 tablet (50 mg total) by mouth at bedtime.  30 tablet  0   No current facility-administered medications for this visit.    Allergies:   No Known Allergies  Social History:  The patient  reports that she has never smoked. She has never used smokeless tobacco. She reports that she does not drink alcohol or use illicit drugs.   Family History:  The patient's family history includes Coronary artery disease in an other family member; Coronary artery disease (age of onset: 52) in her mother; Depression in an other  family member; Heart attack in an other family member; Heart attack (age of onset: 58) in her son.   ROS:  Please see the history of present illness.      All other systems reviewed and negative.   PHYSICAL EXAM: VS:  BP 140/80  Pulse 68  Ht 5\' 4"  (1.626 m)  Wt 123 lb (55.792 kg)  BMI 21.1 kg/m2 Well nourished, well developed, in no acute distress HEENT: normal Neck: no JVD at 90 Cardiac:  normal S1, S2; irregularly irregular rhythm; no murmur Lungs:  Faint coarse breath sounds at the bases bilaterally left greater than right, clears with cough; no wheeze Abd: soft, nontender, no hepatomegaly Ext: trace bilateral ankle edema Skin: warm and dry Neuro:  CNs 2-12 intact, no focal abnormalities noted  EKG:  Atrial fibrillation, HR 68, diff ST changes, no change from prior tracing     ASSESSMENT AND PLAN:  1. Chronic Systolic CHF:  Volume appears improved.  She has required increasing doses of Lasix since d/c from the hospital.  I will have her continue on Lasix 20 mg QD except take 40 mg on MWF.  Check BMET today.   2. CAD:  No angina.  Continue conservative management.  Continue ASA and statin. 3. Atrial Fibrillation:  Rate controlled.  Not a coumadin candidate. 4. Hyponatremia: check BMET today. 5. Disposition:  Keep f/u appt with Dr. [name] in 2 weeks.   Signed, Tereso Newcomer, PA-C  07/04/2013 12:52 PM

## 2013-07-04 NOTE — Patient Instructions (Addendum)
YOU CAN GO TO [facility] IN [place] AND GET THE SAME STRENGTH YOU HAVE NOW   CHANGE LASIX TO 20 MG EVERY DAY EXCEPT ON MOND, WED, AND FRI'S YOU WILL TAKE 40 MG  LAB TODAY BMET  MAKE SURE TO KEEP YOUR FOLLOW UP WITH DR. Clifton James 07/17/13 AT 10 AM; YOU CAN GET YOUR FLU SHOT THAT SAME DAY

## 2013-07-04 NOTE — Telephone Encounter (Signed)
Spoke to patient son and he did not call in today, saw Tereso Newcomer PA  DOCUMENTED IN [facility] CHART

## 2013-07-11 ENCOUNTER — Ambulatory Visit: Payer: Medicare Other | Admitting: Podiatry

## 2013-07-15 ENCOUNTER — Encounter: Payer: Self-pay | Admitting: Cardiovascular Disease

## 2013-07-17 ENCOUNTER — Ambulatory Visit (INDEPENDENT_AMBULATORY_CARE_PROVIDER_SITE_OTHER): Payer: Medicare Other | Admitting: Cardiovascular Disease

## 2013-07-17 ENCOUNTER — Encounter: Payer: Self-pay | Admitting: Cardiovascular Disease

## 2013-07-17 VITALS — BP 120/60 | HR 67 | Ht 64.0 in | Wt 119.8 lb

## 2013-07-17 DIAGNOSIS — I4891 Unspecified atrial fibrillation: Secondary | ICD-10-CM

## 2013-07-17 DIAGNOSIS — I2510 Atherosclerotic heart disease of native coronary artery without angina pectoris: Secondary | ICD-10-CM

## 2013-07-17 DIAGNOSIS — I5022 Chronic systolic (congestive) heart failure: Secondary | ICD-10-CM

## 2013-07-17 DIAGNOSIS — I428 Other cardiomyopathies: Secondary | ICD-10-CM

## 2013-07-17 DIAGNOSIS — E871 Hypo-osmolality and hyponatremia: Secondary | ICD-10-CM

## 2013-07-17 DIAGNOSIS — Z23 Encounter for immunization: Secondary | ICD-10-CM

## 2013-07-17 LAB — BASIC METABOLIC PANEL
BUN: 22 mg/dL (ref 6–23)
Chloride: 97 mEq/L (ref 96–112)
GFR: 87.69 mL/min (ref >60.00)
Potassium: 4.2 mEq/L (ref 3.5–5.1)

## 2013-07-17 MED ORDER — CARVEDILOL 3.125 MG PO TABS: 3.1250 mg | ORAL_TABLET | Freq: Two times a day (BID) | ORAL | Status: DC

## 2013-07-17 MED ORDER — DIGOXIN 125 MCG PO TABS: 0.1250 mg | ORAL_TABLET | Freq: Every day | ORAL | Status: DC

## 2013-07-17 MED ORDER — ATORVASTATIN CALCIUM 20 MG PO TABS: ORAL_TABLET | ORAL | Status: DC

## 2013-07-17 MED ORDER — FUROSEMIDE 20 MG PO TABS: ORAL_TABLET | ORAL | Status: DC

## 2013-07-17 NOTE — Progress Notes (Signed)
History of Present Illness: 79 yo female with history of HTN, atrial fibrillation, dementia, CAD, non-ischemic cardiomyopathy here today for follow up. She was admitted to [facility] 05/1913 with volume overload, atrial fibrillation with RVR. Echo 05/21/13 with dilated LV cavity, LVEF 35%, mild MR. Cardiac cath 05/22/13 per Dr. Sw[place] with 80% proximal LAD stenosis, 30% RCA stenosis. Medical management was recommended. She was diuresed with IV Lasix and was rate controlled with low dose Coreg and Digoxin. Her sodium was low in the hospital but was stable with diuresis. She was not felt to be a good candidate for long term anti-coagulation given advanced age and dementia. She did develop pulmonary edema at home which improved with higher dose of Lasix. Seen here by Tereso Newcomer, PA-C 10/03.14.   She is here today for follow up. No chest pain or SOB. Feels well. Only complaint of poor sleep.   Primary Care Physician: Laury Axon  Last Lipid Profile:Lipid Panel     Component Value Date/Time   CHOL 126 05/23/2013 0500   TRIG 57 05/23/2013 0500   HDL 57 05/23/2013 0500   CHOLHDL 2.2 05/23/2013 0500   VLDL 11 05/23/2013 0500   LDLCALC 58 05/23/2013 0500     Past Medical History  Diagnosis Date  . Hypertension   . Osteoporosis   . Overactive bladder   . Chronic systolic heart failure 05/20/2013    a. Echo (8/14):  mild LVH, EF 35%, diff HK, mild MR  . Atrial fibrillation     not a coumadin candidate  . Dementia   . Arthritis   . CAD (coronary artery disease)     a. LHC (8/14):  prox to mid LAD 80, pRCA 30, pPDA 30-40, EF 50%  . Hyponatremia   . NICM (nonischemic cardiomyopathy)     Past Surgical History  Procedure Laterality Date  . Abdominal hysterectomy      TAH BSO  . Spine surgery  1980    Lumbar surgery    Current Outpatient Prescriptions  Medication Sig Dispense Refill  . Acetaminophen (TYLENOL PO) Take by mouth as needed.      Marland Kitchen aspirin EC 81 MG EC tablet Take 1 tablet (81 mg total)  by mouth daily.  30 tablet  0  . atorvastatin (LIPITOR) 20 MG tablet TAKE 1 TABLET EVERY DAY AT 6PM  30 tablet  11  . carvedilol (COREG) 3.125 MG tablet TAKE 1 TABLET (3.125 MG TOTAL) BY MOUTH 2 (TWO) TIMES DAILY WITH A MEAL.  60 tablet  0  . digoxin (LANOXIN) 0.125 MG tablet TAKE 1 TABLET EVERY DAY  30 tablet  0  . donepezil (ARICEPT) 5 MG tablet Take 1 tablet (5 mg total) by mouth at bedtime as needed.  30 tablet  5  . furosemide (LASIX) 20 MG tablet CHANGE LASIX TO 20 MG EVERY DAY; EXCEPT ONLY 40 MG ON M, W, AND FRI'S  30 tablet  11  . Multiple Vitamins-Minerals (CENTRUM SILVER ADULT 50+) TABS Take 1 tablet by mouth daily.      Marland Kitchen oxybutynin (DITROPAN-XL) 5 MG 24 hr tablet TAKE 1 TABLET BY MOUTH EVERY DAY  30 tablet  5  . polyethylene glycol powder (MIRALAX) powder Take 17 g by mouth as needed (constipation).       . QUEtiapine (SEROQUEL) 50 MG tablet Take 1 tablet (50 mg total) by mouth at bedtime.  30 tablet  0   No current facility-administered medications for this visit.    No Known Allergies  History   Social History  . Marital Status: Married    Spouse Name: N/A    Number of Children: N/A  . Years of Education: N/A   Occupational History  . retired     Engineer, civil (consulting)  . volunteers at hospice    Social History Main Topics  . Smoking status: Never Smoker   . Smokeless tobacco: Never Used  . Alcohol Use: No  . Drug Use: No  . Sexual Activity: No   Other Topics Concern  . Not on file   Social History Narrative  . No narrative on file    Family History  Problem Relation Age of Onset  . Depression    . Heart attack    . Coronary artery disease    . Coronary artery disease Mother 94  . Heart attack Son 51    Review of Systems:  As stated in the HPI and otherwise negative.   BP 120/60  Pulse 67  Ht 5\' 4"  (1.626 m)  Wt 119 lb 12.8 oz (54.341 kg)  BMI 20.55 kg/m2  SpO2 96%  Physical Examination: General: Well developed, well nourished, NAD HEENT: OP clear, mucus  membranes moist SKIN: warm, dry. No rashes. Neuro: No focal deficits Musculoskeletal: Muscle strength 5/5 all ext Psychiatric: Mood and affect normal Neck: No JVD, no carotid bruits, no thyromegaly, no lymphadenopathy. Lungs:Clear bilaterally, no wheezes, rhonci, crackles Cardiovascular: Regular rate and rhythm. No murmurs, gallops or rubs. Abdomen:Soft. Bowel sounds present. Non-tender.  Extremities: No lower extremity edema. Pulses are 2 + in the bilateral DP/PT.  Echo 05/21/13:  Left ventricle: The cavity size was mildly dilated. Wall thickness was increased in a pattern of mild LVH. The estimated ejection fraction was 35%. Diffuse hypokinesis. - Mitral valve: Mild regurgitation. - Atrial septum: No defect or patent foramen ovale was Identified.   Cardiac cath 05/22/13:  Left mainstem: Normal  Left anterior descending (LAD): The LAD is moderately calcified. There is an 80% stenosis in the proximal to mid vessel. The distal LAD appears relatively small.  There is a large ramus intermediate branch that trifurcates on the lateral wall. It is normal.  Left circumflex (LCx): The left circumflex is a tiny vessel that terminates early in the AV groove.  Right coronary artery (RCA): The RCA is a large dominant vessel. It gives rise to 3 posterolateral branches and the PDA. There is 30% disease in the proximal RCA. There is 30-40% disease in the proximal PDA.  Left ventriculography: Left ventricular systolic function is abnormal, LVEF is estimated at 50%, there is mild global hypokinesis.There is no significant mitral regurgitation  Final Conclusions:  1. Single vessel obstructive CAD involving the LAD.  2. Low normal LV systolic function. EF 50%.  3. Low LV filling pressures.   Assessment and Plan:   1. CAD: Stable. No angina.   2. Atrial fibrillation: Rate controlled on Coreg and digoxin.   3. Acute and Chronic systolic CHF: LVEF is 35%. Medical management. Volume status is ok today.  Continue Lasix at current dosage of 20 mg per ay with 40 mg every M, W, F. 1.5 liter fluid restriction. Follow daily weights.   4. Non-ischemic Cardiomyopathy: Medical management   5. Hyponatremia: Improved. Repeat BMET today.

## 2013-07-17 NOTE — Patient Instructions (Signed)
Your physician wants you to follow-up in: 3 months You will receive a reminder letter in the mail two months in advance. If you don't receive a letter, please call our office to schedule the follow-up appointment.  Your physician recommends that you continue on your current medications as directed. Please refer to the Current Medication list given to you today.   

## 2013-07-22 ENCOUNTER — Telehealth: Payer: Self-pay | Admitting: *Deleted

## 2013-07-22 ENCOUNTER — Other Ambulatory Visit: Payer: Self-pay | Admitting: Family Medicine

## 2013-07-22 NOTE — Telephone Encounter (Signed)
Yes she can get one

## 2013-07-22 NOTE — Telephone Encounter (Signed)
Pt daughter called and asked if her mother needs the pneumonia vaccine? Please advise. SW, CMA

## 2013-07-22 NOTE — Telephone Encounter (Signed)
Advised patient daughter that she can receive the pneumonia vaccine. Patient was scheduled. SW, CMA

## 2013-07-28 ENCOUNTER — Ambulatory Visit: Payer: Medicare Other | Admitting: *Deleted

## 2013-07-28 DIAGNOSIS — Z23 Encounter for immunization: Secondary | ICD-10-CM

## 2013-07-28 MED ORDER — PNEUMOCOCCAL VAC POLYVALENT 25 MCG/0.5ML IJ INJ: 0.5000 mL | INJECTION | Freq: Once | INTRAMUSCULAR | Status: DC

## 2013-08-23 ENCOUNTER — Other Ambulatory Visit: Payer: Self-pay | Admitting: Family Medicine

## 2013-08-26 ENCOUNTER — Telehealth: Payer: Self-pay | Admitting: Cardiovascular Disease

## 2013-08-26 DIAGNOSIS — I5022 Chronic systolic (congestive) heart failure: Secondary | ICD-10-CM

## 2013-08-26 MED ORDER — FUROSEMIDE 40 MG PO TABS: 40.0000 mg | ORAL_TABLET | Freq: Every day | ORAL | Status: DC

## 2013-08-26 NOTE — Telephone Encounter (Signed)
DR/Nurse here today, will forward.

## 2013-08-26 NOTE — Telephone Encounter (Signed)
New Problem  Daughter called/// Pt has CHF// SOB and ankles are puffy and swollen// Daughter was not with Pt at the time// requests a call back to discuss.

## 2013-08-26 NOTE — Telephone Encounter (Signed)
Let's have her increase Lasix to 40 mg po BID for 4 days then go to 40 mg daily every day, BMET next week. cdm

## 2013-08-26 NOTE — Telephone Encounter (Signed)
Spoke with pt's daughter and gave her instructions from Dr. Clifton James. Pt will come in for BMP on September 02, 2013. Will send new prescription to [facility] on [facility].

## 2013-08-26 NOTE — Telephone Encounter (Signed)
Spoke with pt's daughter. She reports pt has been doing well but over the weekend developed shortness of breath and ankles were puffy.  Extra Lasix given on Sunday.  Yesterday ankles still puffy and pt having shortness of breath. Took normal dose of Lasix 40 mg. Today breathing, shortness of breath and swelling a little better but pt not back to normal and still complaining of some shortness of breath.  No weights are available and they are unable to get accurate weights on pt. Today oxygen sat 97%. Pt's normal dose of Lasix is 40 mg daily on Monday, Wednesday and Friday and 20 mg daily on other days.

## 2013-09-01 ENCOUNTER — Other Ambulatory Visit: Payer: Medicare Other

## 2013-09-02 ENCOUNTER — Other Ambulatory Visit (INDEPENDENT_AMBULATORY_CARE_PROVIDER_SITE_OTHER): Payer: Medicare Other

## 2013-09-02 ENCOUNTER — Other Ambulatory Visit: Payer: Medicare Other

## 2013-09-02 DIAGNOSIS — I5022 Chronic systolic (congestive) heart failure: Secondary | ICD-10-CM

## 2013-09-02 LAB — BASIC METABOLIC PANEL
BUN: 26 mg/dL — ABNORMAL HIGH (ref 6–23)
CO2: 29 mEq/L (ref 19–32)
Calcium: 8.9 mg/dL (ref 8.4–10.5)
Chloride: 101 mEq/L (ref 96–112)
GFR: 83.4 mL/min (ref >60.00)
Potassium: 4.3 mEq/L (ref 3.5–5.1)

## 2013-09-03 ENCOUNTER — Telehealth: Payer: Self-pay | Admitting: *Deleted

## 2013-09-03 MED ORDER — QUETIAPINE FUMARATE 100 MG PO TABS: ORAL_TABLET | ORAL | Status: DC

## 2013-09-03 NOTE — Telephone Encounter (Signed)
Patient daughter called and stated that the patient is on Seroquel and she believes that it is no longer effect for her. Patient daughter states that she is not sure if she needs an increase in the medication or started on a different medication. Please advise. SW

## 2013-09-03 NOTE — Telephone Encounter (Signed)
Ok to increase to 100 mg po qhs #30

## 2013-09-03 NOTE — Telephone Encounter (Signed)
Patient daughter notified and Rx sent to pharmacy.

## 2013-09-10 ENCOUNTER — Ambulatory Visit: Payer: Medicare Other | Admitting: Family Medicine

## 2013-09-26 ENCOUNTER — Telehealth: Payer: Self-pay | Admitting: *Deleted

## 2013-09-26 ENCOUNTER — Other Ambulatory Visit: Payer: Self-pay | Admitting: Family Medicine

## 2013-09-26 ENCOUNTER — Other Ambulatory Visit: Payer: Self-pay | Admitting: *Deleted

## 2013-09-26 MED ORDER — TRAMADOL HCL 50 MG PO TABS: 50.0000 mg | ORAL_TABLET | Freq: Three times a day (TID) | ORAL | Status: DC | PRN

## 2013-09-26 NOTE — Telephone Encounter (Signed)
Ok for Tramadol 50mg  TID prn for severe pain.  If pain continues, will need OV or referral to ortho.

## 2013-09-26 NOTE — Telephone Encounter (Signed)
Patient's daughter called and stated that her mother has really bad dementia and osteoporosis. Daughter states that patient is complaining about her legs being very painful at night to the point where she can not sleep. Daughter states that they notified her cardiologist and they told her to take tylenol but that's not working. Daughter would like to know if there anything else that's mild that she could take to help wit the pain in her legs. Please advise. SW

## 2013-09-26 NOTE — Telephone Encounter (Signed)
Daughter states that she is having pain in both legs. Daughter states that she has not fallen any.

## 2013-09-26 NOTE — Telephone Encounter (Signed)
Daughter states that she has really bad knees and thinks that is what causing her to have legs pain.

## 2013-09-26 NOTE — Telephone Encounter (Signed)
Is pt having pain in both legs or just one?  Has she fallen?  If she has a hx of osteoporosis, she may have a fracture if she's recently fallen.  May need OV here or ER for evaluation.

## 2013-09-26 NOTE — Telephone Encounter (Signed)
Med filled and patient daughter notified

## 2013-09-26 NOTE — Telephone Encounter (Signed)
She could have a compression fracture in her back causing pain to both her legs.  Needs an appt or visit to ER to assess

## 2013-10-10 ENCOUNTER — Ambulatory Visit (INDEPENDENT_AMBULATORY_CARE_PROVIDER_SITE_OTHER): Payer: Medicare Other | Admitting: Cardiovascular Disease

## 2013-10-10 ENCOUNTER — Encounter: Payer: Self-pay | Admitting: Cardiovascular Disease

## 2013-10-10 ENCOUNTER — Ambulatory Visit: Payer: Medicare Other | Admitting: Cardiovascular Disease

## 2013-10-10 VITALS — BP 108/56 | HR 68 | Ht 66.0 in | Wt 123.0 lb

## 2013-10-10 DIAGNOSIS — I428 Other cardiomyopathies: Secondary | ICD-10-CM

## 2013-10-10 DIAGNOSIS — I5022 Chronic systolic (congestive) heart failure: Secondary | ICD-10-CM

## 2013-10-10 DIAGNOSIS — I2510 Atherosclerotic heart disease of native coronary artery without angina pectoris: Secondary | ICD-10-CM

## 2013-10-10 DIAGNOSIS — I4891 Unspecified atrial fibrillation: Secondary | ICD-10-CM

## 2013-10-10 NOTE — Progress Notes (Signed)
History of Present Illness: 80 yo female with history of HTN, atrial fibrillation, dementia, CAD, non-ischemic cardiomyopathy here today for follow up. She was admitted to [facility]/20/13 with volume overload, atrial fibrillation with RVR. Echo 05/21/13 with dilated LV cavity, LVEF 35%, mild MR. Cardiac cath 05/22/13 per Dr. [place]il[place] with 80% proximal LAD stenosis, 30% RCA stenosis. Medical management was recommended. She was diuresed with IV Lasix and was rate controlled with low dose Coreg and Digoxin. She was not felt to be a good candidate for long term anti-coagulation given advanced age and dementia. She did develop pulmonary edema at home which improved with higher dose of Lasix.   She is here today for follow up. No chest pain or SOB. Feels well.   Primary Care Physician: Laury Axon  Last Lipid Profile:Lipid Panel     Component Value Date/Time   CHOL 126 05/23/2013 0500   TRIG 57 05/23/2013 0500   HDL 57 05/23/2013 0500   CHOLHDL 2.2 05/23/2013 0500   VLDL 11 05/23/2013 0500   LDLCALC 58 05/23/2013 0500     Past Medical History  Diagnosis Date  . Hypertension   . Osteoporosis   . Overactive bladder   . Chronic systolic heart failure 05/20/2013    a. Echo (8/14):  mild LVH, EF 35%, diff HK, mild MR  . Atrial fibrillation     not a coumadin candidate  . Dementia   . Arthritis   . CAD (coronary artery disease)     a. LHC (8/14):  prox to mid LAD 80, pRCA 30, pPDA 30-40, EF 50%  . Hyponatremia   . NICM (nonischemic cardiomyopathy)     Past Surgical History  Procedure Laterality Date  . Abdominal hysterectomy      TAH BSO  . Spine surgery  1980    Lumbar surgery    Current Outpatient Prescriptions  Medication Sig Dispense Refill  . Acetaminophen (TYLENOL PO) Take by mouth as needed.      Marland Kitchen aspirin EC 81 MG EC tablet Take 1 tablet (81 mg total) by mouth daily.  30 tablet  0  . atorvastatin (LIPITOR) 20 MG tablet TAKE 1 TABLET EVERY DAY AT 6PM  30 tablet  11  . carvedilol (COREG)  3.125 MG tablet Take 1 tablet (3.125 mg total) by mouth 2 (two) times daily with a meal.  60 tablet  6  . digoxin (LANOXIN) 0.125 MG tablet Take 1 tablet (0.125 mg total) by mouth daily.  30 tablet  6  . donepezil (ARICEPT) 5 MG tablet Take 1 tablet (5 mg total) by mouth at bedtime as needed.  30 tablet  5  . furosemide (LASIX) 40 MG tablet Take 1 tablet (40 mg total) by mouth daily.  30 tablet  6  . hydrochlorothiazide (HYDRODIURIL) 25 MG tablet TAKE 1 TABLET BY MOUTH EVERY DAY  30 tablet  2  . Multiple Vitamins-Minerals (CENTRUM SILVER ADULT 50+) TABS Take 1 tablet by mouth daily.      Marland Kitchen oxybutynin (DITROPAN-XL) 5 MG 24 hr tablet TAKE 1 TABLET BY MOUTH EVERY DAY  30 tablet  5  . polyethylene glycol powder (MIRALAX) powder Take 17 g by mouth as needed (constipation).       . QUEtiapine (SEROQUEL) 100 MG tablet TAKE 1 TABLET AT BEDTIME  30 tablet  2  . traMADol (ULTRAM) 50 MG tablet Take 1 tablet (50 mg total) by mouth every 8 (eight) hours as needed.  30 tablet  0   No  current facility-administered medications for this visit.    No Known Allergies  History   Social History  . Marital Status: Married    Spouse Name: N/A    Number of Children: N/A  . Years of Education: N/A   Occupational History  . retired     Engineer, civil (consulting)nurse  . volunteers at hospice    Social History Main Topics  . Smoking status: Never Smoker   . Smokeless tobacco: Never Used  . Alcohol Use: No  . Drug Use: No  . Sexual Activity: No   Other Topics Concern  . Not on file   Social History Narrative  . No narrative on file    Family History  Problem Relation Age of Onset  . Depression    . Heart attack    . Coronary artery disease    . Coronary artery disease Mother 1169  . Heart attack Son 5451    Review of Systems:  As stated in the HPI and otherwise negative.   BP 108/56  Pulse 68  Ht 5\' 6"  (1.676 m)  Wt 123 lb (55.792 kg)  BMI 19.86 kg/m2  SpO2 91%  Physical Examination: General: Well developed,  well nourished, NAD HEENT: OP clear, mucus membranes moist SKIN: warm, dry. No rashes. Neuro: No focal deficits Musculoskeletal: Muscle strength 5/5 all ext Psychiatric: Mood and affect normal Neck: No JVD, no carotid bruits, no thyromegaly, no lymphadenopathy. Lungs:Clear bilaterally, no wheezes, rhonci, crackles Cardiovascular: Regular rate and rhythm. No murmurs, gallops or rubs. Abdomen:Soft. Bowel sounds present. Non-tender.  Extremities: No lower extremity edema. Pulses are 2 + in the bilateral DP/PT.  Echo 05/21/13:  Left ventricle: The cavity size was mildly dilated. Wall thickness was increased in a pattern of mild LVH. The estimated ejection fraction was 35%. Diffuse hypokinesis. - Mitral valve: Mild regurgitation. - Atrial septum: No defect or patent foramen ovale was Identified.   Cardiac cath 05/22/13:  Left mainstem: Normal  Left anterior descending (LAD): The LAD is moderately calcified. There is an 80% stenosis in the proximal to mid vessel. The distal LAD appears relatively small.  There is a large ramus intermediate branch that trifurcates on the lateral wall. It is normal.  Left circumflex (LCx): The left circumflex is a tiny vessel that terminates early in the AV groove.  Right coronary artery (RCA): The RCA is a large dominant vessel. It gives rise to 3 posterolateral branches and the PDA. There is 30% disease in the proximal RCA. There is 30-40% disease in the proximal PDA.  Left ventriculography: Left ventricular systolic function is abnormal, LVEF is estimated at 50%, there is mild global hypokinesis.There is no significant mitral regurgitation  Final Conclusions:  1. Single vessel obstructive CAD involving the LAD.  2. Low normal LV systolic function. EF 50%.  3. Low LV filling pressures.   Assessment and Plan:   1. CAD: Stable. No angina. Continue current meds.   2. Atrial fibrillation: Rate controlled on Coreg and digoxin. She is not on anti-coagulation  after long discussions regarding this with pt and family. Risk of anti-coagulation felt to outweigh potential benefit given advanced age and dementia.   3. Acute and Chronic systolic CHF: LVEF is 35%. Medical management. Volume status is ok today. Continue Lasix at current dosage of 40 mg per day. 1.5 liter fluid restriction. Follow daily weights.   4. Non-ischemic Cardiomyopathy: Medical management. Not ICD candidate given advanced age and dementia.

## 2013-10-10 NOTE — Patient Instructions (Signed)
Your physician wants you to follow-up in:  6 months. You will receive a reminder letter in the mail two months in advance. If you don't receive a letter, please call our office to schedule the follow-up appointment.   

## 2013-10-16 ENCOUNTER — Encounter: Payer: Self-pay | Admitting: Family Medicine

## 2013-10-16 ENCOUNTER — Ambulatory Visit (HOSPITAL_BASED_OUTPATIENT_CLINIC_OR_DEPARTMENT_OTHER)
Admission: RE | Admit: 2013-10-16 | Discharge: 2013-10-16 | Disposition: A | Discharge status: Home / Self Care | Payer: Medicare Other | Source: Ambulatory Visit | Attending: Family Medicine | Admitting: Family Medicine

## 2013-10-16 ENCOUNTER — Ambulatory Visit (INDEPENDENT_AMBULATORY_CARE_PROVIDER_SITE_OTHER): Payer: Medicare Other | Admitting: Family Medicine

## 2013-10-16 VITALS — BP 112/66 | HR 73 | Temp 97.7°F

## 2013-10-16 DIAGNOSIS — R062 Wheezing: Secondary | ICD-10-CM

## 2013-10-16 DIAGNOSIS — J209 Acute bronchitis, unspecified: Secondary | ICD-10-CM

## 2013-10-16 DIAGNOSIS — J90 Pleural effusion, not elsewhere classified: Secondary | ICD-10-CM | POA: Insufficient documentation

## 2013-10-16 MED ORDER — ALBUTEROL SULFATE (2.5 MG/3ML) 0.083% IN NEBU
2.5000 mg | INHALATION_SOLUTION | Freq: Once | RESPIRATORY_TRACT | Status: AC
  Administered 2013-10-16: 2.5 mg via RESPIRATORY_TRACT

## 2013-10-16 MED ORDER — AZITHROMYCIN 250 MG PO TABS: ORAL_TABLET | ORAL | Status: DC

## 2013-10-16 NOTE — Progress Notes (Signed)
Pre visit review using our clinic review tool, if applicable. No additional management support is needed unless otherwise documented below in the visit note. 

## 2013-10-16 NOTE — Progress Notes (Signed)
  Subjective:     [name] is a 80 y.o. female here for evaluation of a cough--- caretaker and son are with pt and giving hx.  Pt saw cardiology 2 days ago and had no problems.  Ov note reviewed. Onset of symptoms was 2 days ago. Symptoms have been gradually worsening since that time. The cough is productive and is aggravated by infection and reclining position. Associated symptoms include: shortness of breath, sputum production and wheezing. Patient does not have a history of asthma. Patient does not have a history of environmental allergens. Patient has not traveled recently. Patient does not have a history of smoking. Patient has not had a previous chest x-ray. Patient has not had a PPD done.  The following portions of the patient's history were reviewed and updated as appropriate: allergies, current medications, past family history, past medical history, past social history, past surgical history and problem list.  Review of Systems Pertinent items are noted in HPI.    Objective:    Oxygen saturation 94% on room air BP 112/66  Pulse 73  Temp(Src) 97.7 F (36.5 C) (Oral)  SpO2 94% General appearance: alert, cooperative, appears stated age and no distress Ears: normal TM's and external ear canals both ears Nose: Nares normal. Septum midline. Mucosa normal. No drainage or sinus tenderness. Throat: lips, mucosa, and tongue normal; teeth and gums normal Neck: no adenopathy and thyroid not enlarged, symmetric, no tenderness/mass/nodules Lungs: rhonchi bilaterally and wheezes bilaterally Heart: S1, S2 normal    Assessment:    Acute Bronchitis    Plan:    Antibiotics per medication orders. Avoid exposure to tobacco smoke and fumes. Call if shortness of breath worsens, blood in sputum, change in character of cough, development of fever or chills, inability to maintain nutrition and hydration. Avoid exposure to tobacco smoke and fumes. Chest x-ray.

## 2013-10-16 NOTE — Patient Instructions (Signed)

## 2013-10-17 ENCOUNTER — Telehealth: Payer: Self-pay | Admitting: *Deleted

## 2013-10-17 NOTE — Telephone Encounter (Signed)
Patient son called and was upset because he was told that he would be called with his mother x-ray results. Patient would like a phone call today.

## 2013-10-17 NOTE — Telephone Encounter (Signed)
B/l pleural effusions-- they were on last cxr and have improved  No pneumonia

## 2013-10-17 NOTE — Telephone Encounter (Signed)
[name] has been made aware.     KP

## 2013-10-19 ENCOUNTER — Other Ambulatory Visit: Payer: Self-pay | Admitting: Family Medicine

## 2013-10-22 ENCOUNTER — Other Ambulatory Visit: Payer: Self-pay | Admitting: Family Medicine

## 2013-10-28 ENCOUNTER — Encounter (HOSPITAL_COMMUNITY): Payer: Self-pay | Admitting: Emergency Medicine

## 2013-10-28 ENCOUNTER — Ambulatory Visit: Payer: Medicare Other | Admitting: Family Medicine

## 2013-10-28 ENCOUNTER — Emergency Department (HOSPITAL_COMMUNITY): Payer: Medicare Other

## 2013-10-28 ENCOUNTER — Emergency Department (HOSPITAL_COMMUNITY)
Admission: EM | Admit: 2013-10-28 | Discharge: 2013-10-28 | Disposition: A | Discharge status: Home / Self Care | Payer: Medicare Other | Attending: Emergency Medicine | Admitting: Emergency Medicine

## 2013-10-28 DIAGNOSIS — F0390 Unspecified dementia without behavioral disturbance: Secondary | ICD-10-CM | POA: Insufficient documentation

## 2013-10-28 DIAGNOSIS — Z8639 Personal history of other endocrine, nutritional and metabolic disease: Secondary | ICD-10-CM | POA: Insufficient documentation

## 2013-10-28 DIAGNOSIS — Z862 Personal history of diseases of the blood and blood-forming organs and certain disorders involving the immune mechanism: Secondary | ICD-10-CM | POA: Insufficient documentation

## 2013-10-28 DIAGNOSIS — J40 Bronchitis, not specified as acute or chronic: Secondary | ICD-10-CM

## 2013-10-28 DIAGNOSIS — M129 Arthropathy, unspecified: Secondary | ICD-10-CM | POA: Insufficient documentation

## 2013-10-28 DIAGNOSIS — Z87448 Personal history of other diseases of urinary system: Secondary | ICD-10-CM | POA: Insufficient documentation

## 2013-10-28 DIAGNOSIS — I5022 Chronic systolic (congestive) heart failure: Secondary | ICD-10-CM | POA: Insufficient documentation

## 2013-10-28 DIAGNOSIS — I10 Essential (primary) hypertension: Secondary | ICD-10-CM | POA: Insufficient documentation

## 2013-10-28 DIAGNOSIS — Z79899 Other long term (current) drug therapy: Secondary | ICD-10-CM | POA: Insufficient documentation

## 2013-10-28 DIAGNOSIS — I2510 Atherosclerotic heart disease of native coronary artery without angina pectoris: Secondary | ICD-10-CM | POA: Insufficient documentation

## 2013-10-28 DIAGNOSIS — B9789 Other viral agents as the cause of diseases classified elsewhere: Secondary | ICD-10-CM

## 2013-10-28 DIAGNOSIS — I4891 Unspecified atrial fibrillation: Secondary | ICD-10-CM | POA: Insufficient documentation

## 2013-10-28 DIAGNOSIS — Z7982 Long term (current) use of aspirin: Secondary | ICD-10-CM | POA: Insufficient documentation

## 2013-10-28 DIAGNOSIS — J069 Acute upper respiratory infection, unspecified: Secondary | ICD-10-CM | POA: Insufficient documentation

## 2013-10-28 LAB — COMPREHENSIVE METABOLIC PANEL
ALT: 14 U/L (ref 0–35)
AST: 14 U/L (ref 0–37)
Albumin: 2.3 g/dL — ABNORMAL LOW (ref 3.5–5.2)
Alkaline Phosphatase: 80 U/L (ref 39–117)
BUN: 25 mg/dL — ABNORMAL HIGH (ref 6–23)
CO2: 24 mEq/L (ref 19–32)
Calcium: 8.6 mg/dL (ref 8.4–10.5)
Chloride: 100 mEq/L (ref 96–112)
Creatinine, Ser: 0.7 mg/dL (ref 0.50–1.10)
GFR calc Af Amer: >90 mL/min (ref >90)
GFR calc non Af Amer: 78 mL/min — ABNORMAL LOW (ref >90)
Glucose, Bld: 107 mg/dL — ABNORMAL HIGH (ref 70–99)
Potassium: 4.6 mEq/L (ref 3.7–5.3)
Sodium: 137 mEq/L (ref 137–147)
Total Bilirubin: 0.5 mg/dL (ref 0.3–1.2)
Total Protein: 6 g/dL (ref 6.0–8.3)

## 2013-10-28 LAB — URINALYSIS, ROUTINE W REFLEX MICROSCOPIC
Bilirubin Urine: NEGATIVE
Glucose, UA: NEGATIVE mg/dL
Hgb urine dipstick: NEGATIVE
Ketones, ur: NEGATIVE mg/dL
Nitrite: NEGATIVE
Protein, ur: NEGATIVE mg/dL
Specific Gravity, Urine: 1.025 (ref 1.005–1.030)
Urobilinogen, UA: 0.2 mg/dL (ref 0.0–1.0)
pH: 6 (ref 5.0–8.0)

## 2013-10-28 LAB — CBC WITH DIFFERENTIAL/PLATELET
Basophils Absolute: 0 10*3/uL (ref 0.0–0.1)
Basophils Relative: 0 % (ref 0–1)
Eosinophils Absolute: 0.4 10*3/uL (ref 0.0–0.7)
Eosinophils Relative: 3 % (ref 0–5)
HCT: 36.6 % (ref 36.0–46.0)
Hemoglobin: 12.4 g/dL (ref 12.0–15.0)
Lymphocytes Relative: 15 % (ref 12–46)
Lymphs Abs: 1.7 10*3/uL (ref 0.7–4.0)
MCH: 28.5 pg (ref 26.0–34.0)
MCHC: 33.9 g/dL (ref 30.0–36.0)
MCV: 84.1 fL (ref 78.0–100.0)
Monocytes Absolute: 1.6 10*3/uL — ABNORMAL HIGH (ref 0.1–1.0)
Monocytes Relative: 14 % — ABNORMAL HIGH (ref 3–12)
Neutro Abs: 7.6 10*3/uL (ref 1.7–7.7)
Neutrophils Relative %: 68 % (ref 43–77)
Platelets: 304 10*3/uL (ref 150–400)
RBC: 4.35 MIL/uL (ref 3.87–5.11)
RDW: 15.9 % — ABNORMAL HIGH (ref 11.5–15.5)
WBC: 11.2 10*3/uL — ABNORMAL HIGH (ref 4.0–10.5)

## 2013-10-28 LAB — URINE MICROSCOPIC-ADD ON

## 2013-10-28 LAB — POCT I-STAT TROPONIN I: TROPONIN I, POC: 0.04 ng/mL (ref 0.00–0.08)

## 2013-10-28 MED ORDER — IPRATROPIUM BROMIDE 0.02 % IN SOLN
0.5000 mg | Freq: Once | RESPIRATORY_TRACT | Status: AC
  Administered 2013-10-28: 0.5 mg via RESPIRATORY_TRACT
  Filled 2013-10-28: qty 2.5

## 2013-10-28 MED ORDER — AEROCHAMBER PLUS FLO-VU MEDIUM MISC
1.0000 | Freq: Once | Status: AC
  Administered 2013-10-28: 1
  Filled 2013-10-28: qty 1

## 2013-10-28 MED ORDER — GUAIFENESIN ER 1200 MG PO TB12: 0.5000 | ORAL_TABLET | Freq: Two times a day (BID) | ORAL | Status: DC

## 2013-10-28 MED ORDER — ALBUTEROL SULFATE HFA 108 (90 BASE) MCG/ACT IN AERS
2.0000 | INHALATION_SPRAY | Freq: Four times a day (QID) | RESPIRATORY_TRACT | Status: DC
  Administered 2013-10-28: 2 via RESPIRATORY_TRACT

## 2013-10-28 MED ORDER — ALBUTEROL SULFATE HFA 108 (90 BASE) MCG/ACT IN AERS
INHALATION_SPRAY | RESPIRATORY_TRACT | Status: AC
  Filled 2013-10-28: qty 6.7

## 2013-10-28 MED ORDER — AEROCHAMBER PLUS W/MASK MISC
Status: AC
  Filled 2013-10-28: qty 1

## 2013-10-28 MED ORDER — ALBUTEROL SULFATE (2.5 MG/3ML) 0.083% IN NEBU
5.0000 mg | INHALATION_SOLUTION | Freq: Once | RESPIRATORY_TRACT | Status: AC
  Administered 2013-10-28: 5 mg via RESPIRATORY_TRACT
  Filled 2013-10-28: qty 6

## 2013-10-28 NOTE — ED Notes (Signed)
Attempted to do in and out cath no urine return talked with PA Ebbie Ridge will attempt again later.

## 2013-10-28 NOTE — Discharge Instructions (Signed)
Return here as needed.  Follow up with her primary care Dr. for recheck.  Increase her fluid intake

## 2013-10-28 NOTE — ED Provider Notes (Signed)
CSN: [id number]     Arrival date & time 10/28/13  1213 History   First MD Initiated Contact with Patient 10/28/13 1222     Chief Complaint  Patient presents with  . Shortness of Breath   (Consider location/radiation/quality/duration/timing/severity/associated sxs/prior Treatment) HPI Patient presents emergency department with cough, shortness of breath, that has worsened over the last 2 days.  She has been seen by her primary care doctor in the last 2 weeks for similar symptoms.  The patient has had no fever, nausea, vomiting, chest pain, back pain, abdominal pain, diarrhea lethargy, or syncope.  Son states, that the patient said that increased work of breathing. Past Medical History  Diagnosis Date  . Hypertension   . Osteoporosis   . Overactive bladder   . Chronic systolic heart failure 05/20/2013    a. Echo (8/14):  mild LVH, EF 35%, diff HK, mild MR  . Atrial fibrillation     not a coumadin candidate  . Dementia   . Arthritis   . CAD (coronary artery disease)     a. LHC (8/14):  prox to mid LAD 80, pRCA 30, pPDA 30-40, EF 50%  . Hyponatremia   . NICM (nonischemic cardiomyopathy)    Past Surgical History  Procedure Laterality Date  . Abdominal hysterectomy      TAH BSO  . Spine surgery  1980    Lumbar surgery   Family History  Problem Relation Age of Onset  . Depression    . Heart attack    . Coronary artery disease    . Coronary artery disease Mother 60  . Heart attack Son 50   History  Substance Use Topics  . Smoking status: Never Smoker   . Smokeless tobacco: Never Used  . Alcohol Use: No   OB History   Grav Para Term Preterm Abortions TAB SAB Ect Mult Living                 Review of Systems All other systems negative except as documented in the HPI. All pertinent positives and negatives as reviewed in the HPI.  Allergies  Review of patient's allergies indicates no known allergies.  Home Medications   Current Outpatient Rx  Name  Route  Sig  Dispense   Refill  . Acetaminophen (TYLENOL PO)   Oral   Take by mouth as needed.         Marland Kitchen aspirin EC 81 MG EC tablet   Oral   Take 1 tablet (81 mg total) by mouth daily.   30 tablet   0   . atorvastatin (LIPITOR) 20 MG tablet      TAKE 1 TABLET EVERY DAY AT 6PM   30 tablet   11   . azithromycin (ZITHROMAX Z-PAK) 250 MG tablet      As above   6 each   0   . carvedilol (COREG) 3.125 MG tablet   Oral   Take 1 tablet (3.125 mg total) by mouth 2 (two) times daily with a meal.   60 tablet   6   . digoxin (LANOXIN) 0.125 MG tablet   Oral   Take 1 tablet (0.125 mg total) by mouth daily.   30 tablet   6   . donepezil (ARICEPT) 5 MG tablet      TAKE 1 TABLET BY MOUTH AT BEDTIME   30 tablet   11   . furosemide (LASIX) 40 MG tablet   Oral   Take 1 tablet (40 mg  total) by mouth daily.   30 tablet   6   . hydrochlorothiazide (HYDRODIURIL) 25 MG tablet      TAKE 1 TABLET BY MOUTH EVERY DAY   30 tablet   2   . Multiple Vitamins-Minerals (CENTRUM SILVER ADULT 50+) TABS   Oral   Take 1 tablet by mouth daily.         Marland Kitchen. oxybutynin (DITROPAN-XL) 5 MG 24 hr tablet      TAKE 1 TABLET BY MOUTH EVERY DAY   30 tablet   5   . polyethylene glycol powder (MIRALAX) powder   Oral   Take 17 g by mouth as needed (constipation).          . QUEtiapine (SEROQUEL) 100 MG tablet      TAKE 1 TABLET AT BEDTIME   30 tablet   2   . traMADol (ULTRAM) 50 MG tablet   Oral   Take 1 tablet (50 mg total) by mouth every 8 (eight) hours as needed.   30 tablet   0    BP 112/65  Pulse 62  Temp(Src) 98.1 F (36.7 C) (Oral)  Resp 16  SpO2 100% Physical Exam  Nursing note and vitals reviewed. Constitutional: She is oriented to person, place, and time. She appears well-developed and well-nourished. No distress.  HENT:  Head: Normocephalic and atraumatic.  Right Ear: External ear normal.  Mouth/Throat: No oropharyngeal exudate.  Eyes: Pupils are equal, round, and reactive to light.   Neck: Normal range of motion. Neck supple.  Cardiovascular: Normal rate and regular rhythm.  Exam reveals no gallop and no friction rub.   No murmur heard. Pulmonary/Chest: Effort normal. She has no decreased breath sounds. She has no wheezes. She has rhonchi. She has no rales.  Neurological: She is alert and oriented to person, place, and time. She exhibits normal muscle tone. Coordination normal.  Skin: Skin is warm and dry. No rash noted. No erythema.    ED Course  Procedures (including critical care time) Labs Review Labs Reviewed  CBC WITH DIFFERENTIAL  COMPREHENSIVE METABOLIC PANEL  URINALYSIS, ROUTINE W REFLEX MICROSCOPIC  POCT I-STAT TROPONIN I    Date: 10/28/2013  Rate: 69  Rhythm: normal sinus rhythm  QRS Axis: left  Intervals: normal  ST/T Wave abnormalities: ST depressions diffusely  Conduction Disutrbances:none  Narrative Interpretation:   Old EKG Reviewed: unchanged   Patient is feeling considerably better and looks better following a nebulized breathing treatment.  She will be discharged home with an inhaler and advised followup with her primary care Dr. in the next 24 hours.  Patient has already been on a course of antibiotics and feel this is more a viral syndrome.  She was seen by Dr. Wilkie AyeHorton.  She agrees with this plan.  Family.  Voices understanding and agrees to the plan as well   Carlyle DollyChristopher W Sael Furches, PA-C 10/28/13 1533

## 2013-10-28 NOTE — ED Notes (Signed)
Per EMS: Per Pt. Has no complaints. Per pt.'s son he has noticed increased WOB and SOB for the past 2 days.  Pt. Has not had fever or n/v/d.

## 2013-10-29 NOTE — ED Provider Notes (Signed)
Medical screening examination/treatment/procedure(s) were conducted as a shared visit with non-physician practitioner(s) and myself.  I personally evaluated the patient during the encounter.  EKG Interpretation   None       Patient with SOB.  Recent history of bronchitis and course of antibiotics.   Increased WOB noted by family.  Denies CP.  Patient nontoxic on exam.  Improved with neb.  No evidence of PNA on chest xray and patient has been afebrile.  SOB likely related to URI.  No exertional or positional component.  WIll given inhaler and patient to follow-up with PCP in 1-2 days.  After history, exam, and medical workup I feel the patient has been appropriately medically screened and is safe for discharge home. Pertinent diagnoses were discussed with the patient. Patient was given return precautions.   Shon Baton, MD 10/29/13 [phone]

## 2013-10-31 ENCOUNTER — Ambulatory Visit (INDEPENDENT_AMBULATORY_CARE_PROVIDER_SITE_OTHER): Payer: Medicare Other | Admitting: Family Medicine

## 2013-10-31 ENCOUNTER — Encounter: Payer: Self-pay | Admitting: Family Medicine

## 2013-10-31 VITALS — BP 112/66 | HR 64 | Temp 97.4°F

## 2013-10-31 DIAGNOSIS — J209 Acute bronchitis, unspecified: Secondary | ICD-10-CM

## 2013-10-31 DIAGNOSIS — G4700 Insomnia, unspecified: Secondary | ICD-10-CM

## 2013-10-31 DIAGNOSIS — M1990 Unspecified osteoarthritis, unspecified site: Secondary | ICD-10-CM

## 2013-10-31 MED ORDER — TRAMADOL HCL 50 MG PO TABS: 50.0000 mg | ORAL_TABLET | Freq: Three times a day (TID) | ORAL | Status: DC | PRN

## 2013-10-31 MED ORDER — ALBUTEROL SULFATE HFA 108 (90 BASE) MCG/ACT IN AERS: 2.0000 | INHALATION_SPRAY | Freq: Four times a day (QID) | RESPIRATORY_TRACT | Status: DC | PRN

## 2013-10-31 MED ORDER — QUETIAPINE FUMARATE 100 MG PO TABS: 100.0000 mg | ORAL_TABLET | Freq: Every day | ORAL | Status: DC

## 2013-10-31 NOTE — Progress Notes (Signed)
Pre visit review using our clinic review tool, if applicable. No additional management support is needed unless otherwise documented below in the visit note. 

## 2013-10-31 NOTE — Patient Instructions (Signed)
Metered Dose Inhaler with Spacer  Inhaled medicines are the basis of treatment of asthma and other breathing problems. Inhaled medicine can only be effective if used properly. Good technique assures that the medicine reaches the lungs. Your health care provider has asked you to use a spacer with your inhaler to help you take the medicine more effectively. A spacer is a plastic tube with a mouthpiece on one end and an opening that connects to the inhaler on the other end.  Metered dose inhalers (MDIs) are used to deliver a variety of inhaled medicines. These include quick relief or rescue medicines (such as bronchodilators) and controller medicines (such as corticosteroids). The medicine is delivered by pushing down on a metal canister to release a set amount of spray.  If you are using different kinds of inhalers, use your quick relief medicine to open the airways 10 15 minutes before using a steroid if instructed to do so by your health care provider. If you are unsure which inhalers to use and the order of using them, ask your health care provider, nurse, or respiratory therapist.  HOW TO USE THE INHALER WITH A SPACER  1. Remove cap from inhaler.  2. If you are using the inhaler for the first time, you will need to prime it. Shake the inhaler for 5 seconds and release four puffs into the air, away from your face. Ask your health care provider or pharmacist if you have questions about priming your inhaler.  3. Shake inhaler for 5 seconds before each breath in (inhalation).  4. Place the open end of the spacer onto the mouthpiece of the inhaler.  5. Position the inhaler so that the top of the canister faces up and the spacer mouthpiece faces you.  6. Put your index finger on the top of the medicine canister. Your thumb supports the bottom of the inhaler and the spacer.  7. Breathe out (exhale) normally and as completely as possible.  8. Immediately after exhaling, place the spacer between your teeth and into your  mouth. Close your mouth tightly around the spacer.  9. Press the canister down with the index finger to release the medicine.  10. At the same time as the canister is pressed, inhale deeply and slowly until the lungs are completely filled. This should take 4 6 seconds. Keep your tongue down and out of the way.  11. Hold the medicine in your lungs for 5 10 seconds (10 seconds is best). This helps the medicine get into the small airways of your lungs. Exhale.  12. Repeat inhaling deeply through the spacer mouthpiece. Again hold that breath for up to 10 seconds (10 seconds is best). Exhale slowly. If it is difficult to take this second deep breath through the spacer, breathe normally several times through the spacer. Remove the spacer from your mouth.  13. Wait at least 15 30 seconds between puffs. Continue with the above steps until you have taken the number of puffs your health care provider has ordered. Do not use the inhaler more than your health care provider directs you to.  14. Remove spacer from the inhaler and place cap on inhaler.  15. Follow the directions from your health care provider or the inhaler insert for cleaning the inhaler and spacer.  If you are using a steroid inhaler, rinse your mouth with water after your last puff, gargle, and spit out the water. Do not swallow the water.  AVOID:  · Inhaling before or after starting   the spray of medicine. It takes practice to coordinate your breathing with triggering the spray.  · Inhaling through the nose (rather than the mouth) when triggering the spray.  HOW TO DETERMINE IF YOUR INHALER IS FULL OR NEARLY EMPTY  You cannot know when an inhaler is empty by shaking it. A few inhalers are now being made with dose counters. Ask your health care provider for a prescription that has a dose counter if you feel you need that extra help. If your inhaler does not have a counter, ask your health care provider to help you determine the date you need to refill your  inhaler. Write the refill date on a calendar or your inhaler canister. Refill your inhaler 7 10 days before it runs out. Be sure to keep an adequate supply of medicine. This includes making sure it is not expired, and you have a spare inhaler.   SEEK MEDICAL CARE IF:   · Symptoms are only partially relieved with your inhaler.  · You are having trouble using your inhaler.  · You experience some increase in phlegm.  SEEK IMMEDIATE MEDICAL CARE IF:   · You feel little or no relief with your inhalers. You are still wheezing and are feeling shortness of breath or tightness in your chest or both.  · You have dizziness, headaches, or fast heart rate.  · You have chills, fever, or night sweats.  · There is a noticeable increase in phlegm production, or there is blood in the phlegm.  Document Released: 09/18/2005 Document Revised: 07/09/2013 Document Reviewed: 03/06/2013  [employer]® Patient Information ©2014 [employer].

## 2013-11-01 NOTE — Progress Notes (Signed)
  Subjective:     [name] is a 80 y.o. female here for evaluation of a cough. Onset of symptoms was several days ago. Symptoms have been gradually improving since that time. The cough is dry and is aggravated by  reclining and activity. Associated symptoms include: shortness of breath and wheezing. Patient does not have a history of asthma. Patient does not have a history of environmental allergens. Patient has not traveled recently. Patient does not have a history of smoking. Patient has had a previous chest x-ray. Patient has not had a PPD done. See ER visit The following portions of the patient's history were reviewed and updated as appropriate: allergies, current medications, past family history, past medical history, past social history, past surgical history and problem list.  Review of Systems Pertinent items are noted in HPI.    Objective:    Oxygen saturation 97% on room air BP 112/66  Pulse 64  Temp(Src) 97.4 F (36.3 C) (Oral)  SpO2 97% General appearance: alert, cooperative, appears stated age and no distress Ears: normal TM's and external ear canals both ears Nose: Nares normal. Septum midline. Mucosa normal. No drainage or sinus tenderness. Throat: lips, mucosa, and tongue normal; teeth and gums normal Neck: no adenopathy, supple, symmetrical, trachea midline and thyroid not enlarged, symmetric, no tenderness/mass/nodules Lungs: clear to auscultation bilaterally Heart: S1, S2 normal    Assessment:    Acute Bronchitis --resolving   Plan:    B-agonist inhaler. Call if shortness of breath worsens, blood in sputum, change in character of cough, development of fever or chills, inability to maintain nutrition and hydration. Avoid exposure to tobacco smoke and fumes. finish abx

## 2013-11-22 ENCOUNTER — Other Ambulatory Visit: Payer: Self-pay | Admitting: Family Medicine

## 2013-11-25 ENCOUNTER — Telehealth: Payer: Self-pay | Admitting: Cardiovascular Disease

## 2013-11-25 NOTE — Telephone Encounter (Signed)
Pt's son, [name] states pt has been complaining of pain in right hand.

## 2013-11-25 NOTE — Telephone Encounter (Signed)
Yesterday caregiver noticed pt had bilateral swelling from her elbows to her hands. Pt given extra lasix by caregiver.Son is not sure if this helped.  He states pt does not have any abdominal distention, swelling in feet and ankles, puffiness in face or SOB. Pt's son states no numbness/discoloration in hands. Advised son to keep arms and hands elevated, avoid salt/NA, call if symptoms change. I reviewed this with Dr Patty Sermons.

## 2013-11-25 NOTE — Telephone Encounter (Signed)
New message  Patient is having lots of swelling in her legs, Son is concerned. Both arms are puffy as well.  Please call and advise.

## 2013-12-01 ENCOUNTER — Telehealth: Payer: Self-pay | Admitting: Cardiovascular Disease

## 2013-12-01 NOTE — Telephone Encounter (Signed)
New message    Son says pt is retaining fluid in her hips.  She is on a diuretic.  Need advise

## 2013-12-01 NOTE — Telephone Encounter (Signed)
Spoke with pt's son who reports pt is at home with caregiver. Caregiver called him and reported she noticed swelling in pt's hips and buttocks this afternoon. Does not think it is in abdominal area.  No falls.   Pt more short of breath today. No recent weights and it is difficult to get accurate weights on pt. Pt takes 40 mg furosemide daily. I told son it is OK for pt to take one extra dose furosemide today.  Son is not with pt at present time but his sister is going to check on her this evening. I told son if shortness of breath worsens pt should go to ED tonight.  Will send to Dr. Clifton James for further recommendations.

## 2013-12-02 ENCOUNTER — Emergency Department (HOSPITAL_COMMUNITY): Payer: Medicare Other

## 2013-12-02 ENCOUNTER — Encounter (HOSPITAL_COMMUNITY): Payer: Self-pay | Admitting: Emergency Medicine

## 2013-12-02 ENCOUNTER — Emergency Department (HOSPITAL_COMMUNITY)
Admission: EM | Admit: 2013-12-02 | Discharge: 2013-12-02 | Disposition: A | Discharge status: Home / Self Care | Payer: Medicare Other | Attending: Emergency Medicine | Admitting: Emergency Medicine

## 2013-12-02 DIAGNOSIS — F0390 Unspecified dementia without behavioral disturbance: Secondary | ICD-10-CM | POA: Insufficient documentation

## 2013-12-02 DIAGNOSIS — Z7982 Long term (current) use of aspirin: Secondary | ICD-10-CM | POA: Insufficient documentation

## 2013-12-02 DIAGNOSIS — N318 Other neuromuscular dysfunction of bladder: Secondary | ICD-10-CM | POA: Insufficient documentation

## 2013-12-02 DIAGNOSIS — Z8639 Personal history of other endocrine, nutritional and metabolic disease: Secondary | ICD-10-CM | POA: Insufficient documentation

## 2013-12-02 DIAGNOSIS — Z79899 Other long term (current) drug therapy: Secondary | ICD-10-CM | POA: Insufficient documentation

## 2013-12-02 DIAGNOSIS — M129 Arthropathy, unspecified: Secondary | ICD-10-CM | POA: Insufficient documentation

## 2013-12-02 DIAGNOSIS — R609 Edema, unspecified: Secondary | ICD-10-CM | POA: Insufficient documentation

## 2013-12-02 DIAGNOSIS — Z862 Personal history of diseases of the blood and blood-forming organs and certain disorders involving the immune mechanism: Secondary | ICD-10-CM | POA: Insufficient documentation

## 2013-12-02 DIAGNOSIS — I5022 Chronic systolic (congestive) heart failure: Secondary | ICD-10-CM | POA: Insufficient documentation

## 2013-12-02 DIAGNOSIS — I10 Essential (primary) hypertension: Secondary | ICD-10-CM | POA: Insufficient documentation

## 2013-12-02 DIAGNOSIS — I2510 Atherosclerotic heart disease of native coronary artery without angina pectoris: Secondary | ICD-10-CM | POA: Insufficient documentation

## 2013-12-02 LAB — CBC WITH DIFFERENTIAL/PLATELET
Basophils Absolute: 0 10*3/uL (ref 0.0–0.1)
Basophils Relative: 0 % (ref 0–1)
EOS ABS: 0.6 10*3/uL (ref 0.0–0.7)
Eosinophils Relative: 6 % — ABNORMAL HIGH (ref 0–5)
HCT: 39 % (ref 36.0–46.0)
Hemoglobin: 13 g/dL (ref 12.0–15.0)
Lymphocytes Relative: 13 % (ref 12–46)
Lymphs Abs: 1.4 10*3/uL (ref 0.7–4.0)
MCH: 29 pg (ref 26.0–34.0)
MCHC: 33.3 g/dL (ref 30.0–36.0)
MCV: 87.1 fL (ref 78.0–100.0)
MONO ABS: 1.4 10*3/uL — AB (ref 0.1–1.0)
Monocytes Relative: 13 % — ABNORMAL HIGH (ref 3–12)
Neutro Abs: 7.4 10*3/uL (ref 1.7–7.7)
Neutrophils Relative %: 68 % (ref 43–77)
PLATELETS: 320 10*3/uL (ref 150–400)
RBC: 4.48 MIL/uL (ref 3.87–5.11)
RDW: 16.3 % — AB (ref 11.5–15.5)
WBC: 10.8 10*3/uL — ABNORMAL HIGH (ref 4.0–10.5)

## 2013-12-02 LAB — TROPONIN I
Troponin I: <0.3 ng/mL (ref <0.30)
Troponin I: <0.3 ng/mL (ref <0.30)

## 2013-12-02 LAB — BASIC METABOLIC PANEL
BUN: 27 mg/dL — ABNORMAL HIGH (ref 6–23)
CALCIUM: 9.2 mg/dL (ref 8.4–10.5)
CO2: 25 mEq/L (ref 19–32)
CREATININE: 0.64 mg/dL (ref 0.50–1.10)
Chloride: 100 mEq/L (ref 96–112)
GFR calc Af Amer: >90 mL/min (ref >90)
GFR, EST NON AFRICAN AMERICAN: 80 mL/min — AB (ref >90)
GLUCOSE: 90 mg/dL (ref 70–99)
Potassium: 5.1 mEq/L (ref 3.7–5.3)
SODIUM: 137 meq/L (ref 137–147)

## 2013-12-02 LAB — D-DIMER, QUANTITATIVE (NOT AT ARMC): D DIMER QUANT: 2.22 ug{FEU}/mL — AB (ref 0.00–0.48)

## 2013-12-02 LAB — PRO B NATRIURETIC PEPTIDE: Pro B Natriuretic peptide (BNP): 7689 pg/mL — ABNORMAL HIGH (ref 0–450)

## 2013-12-02 MED ORDER — IOHEXOL 350 MG/ML SOLN
80.0000 mL | Freq: Once | INTRAVENOUS | Status: AC | PRN
  Administered 2013-12-02: 80 mL via INTRAVENOUS

## 2013-12-02 MED ORDER — ASPIRIN 81 MG PO CHEW
324.0000 mg | CHEWABLE_TABLET | Freq: Once | ORAL | Status: AC
  Administered 2013-12-02: 243 mg via ORAL
  Filled 2013-12-02: qty 4

## 2013-12-02 NOTE — ED Provider Notes (Signed)
TIME SEEN: 3:33 PM  CHIEF COMPLAINT: Labored breathing  HPI: Patient is an 68 old female with a history of atrial fibrillation on daily aspirin, CHF, hypertension, CAD, dementia who was sent to the emergency department by her son who noticed that she has had labored breathing for the past 2 days. He reports that her last weight 2 months ago was 127 pounds. He feels that she is able to weight since then but has not weighed herself. She has had swelling in her face and arms and legs. Family called her cardiologist Dr. Sanjuana Kava who recommended increasing her Lasix which they started doing last night. Patient denies that she is feeling short of breath. She states she has no chest pain. No recent fevers or cough. Family reports that one of her caregivers has been feeding her food high in sodium for the past several weeks and they think this is contributing to her CHF exacerbation.  ROS: Unobtainable due to patient's dementia  PAST MEDICAL HISTORY/PAST SURGICAL HISTORY:  Past Medical History  Diagnosis Date  . Hypertension   . Osteoporosis   . Overactive bladder   . Chronic systolic heart failure 05/20/2013    a. Echo (8/14):  mild LVH, EF 35%, diff HK, mild MR  . Atrial fibrillation     not a coumadin candidate  . Dementia   . Arthritis   . CAD (coronary artery disease)     a. LHC (8/14):  prox to mid LAD 80, pRCA 30, pPDA 30-40, EF 50%  . Hyponatremia   . NICM (nonischemic cardiomyopathy)     MEDICATIONS:  Prior to Admission medications   Medication Sig Start Date End Date Taking? Authorizing Provider  Acetaminophen (TYLENOL PO) Take by mouth as needed.    Historical Provider, MD  albuterol (PROAIR HFA) 108 (90 BASE) MCG/ACT inhaler Inhale 2 puffs into the lungs every 6 (six) hours as needed for wheezing or shortness of breath. 10/31/13   Lelon Perla, DO  aspirin EC 81 MG EC tablet Take 1 tablet (81 mg total) by mouth daily. 05/29/13   Jeralyn Bennett, MD  atorvastatin (LIPITOR) 20 MG  tablet Take 20 mg by mouth daily at 6 PM.    Historical Provider, MD  carvedilol (COREG) 3.125 MG tablet Take 1 tablet (3.125 mg total) by mouth 2 (two) times daily with a meal. 07/17/13   Kathleene Hazel, MD  digoxin (LANOXIN) 0.125 MG tablet Take 1 tablet (0.125 mg total) by mouth daily. 07/17/13   Kathleene Hazel, MD  donepezil (ARICEPT) 5 MG tablet Take 5 mg by mouth at bedtime.    Historical Provider, MD  furosemide (LASIX) 40 MG tablet Take 1 tablet (40 mg total) by mouth daily. 08/26/13   Kathleene Hazel, MD  Guaifenesin 1200 MG TB12 Take 0.5 tablets (600 mg total) by mouth 2 (two) times daily. 10/28/13   Jamesetta Orleans Lawyer, PA-C  hydrochlorothiazide (HYDRODIURIL) 25 MG tablet Take 25 mg by mouth daily.    Historical Provider, MD  Multiple Vitamins-Minerals (CENTRUM SILVER ADULT 50+) TABS Take 1 tablet by mouth daily.    Historical Provider, MD  oxybutynin (DITROPAN-XL) 5 MG 24 hr tablet TAKE 1 TABLET BY MOUTH EVERY DAY    Yvonne R Lowne, DO  polyethylene glycol powder (MIRALAX) powder Take 17 g by mouth as needed (constipation).     Historical Provider, MD  QUEtiapine (SEROQUEL) 100 MG tablet Take 1 tablet (100 mg total) by mouth at bedtime. 10/31/13   Lelon Perla,  DO  traMADol (ULTRAM) 50 MG tablet Take 1 tablet (50 mg total) by mouth every 8 (eight) hours as needed. 10/31/13   Lelon PerlaYvonne R Lowne, DO    ALLERGIES:  No Known Allergies  SOCIAL HISTORY:  History  Substance Use Topics  . Smoking status: Never Smoker   . Smokeless tobacco: Never Used  . Alcohol Use: No    FAMILY HISTORY: Family History  Problem Relation Age of Onset  . Depression    . Heart attack    . Coronary artery disease    . Coronary artery disease Mother [id number]  . Heart attack Son 51    EXAM: BP 139/81  Pulse 77  Temp(Src) 97.3 F (36.3 C) (Oral)  SpO2 98% CONSTITUTIONAL: Alert and oriented and responds appropriately to questions. Well-appearing; well-nourished HEAD:  Normocephalic EYES: Conjunctivae clear, PERRL ENT: normal nose; no rhinorrhea; moist mucous membranes; pharynx without lesions noted NECK: Supple, no meningismus, no LAD  CARD: RRR; S1 and S2 appreciated; no murmurs, no clicks, no rubs, no gallops RESP: Normal chest excursion without splinting; patient is intermittently tachypneic with mild expiratory wheeze but no rhonchi or rales, no hypoxia ABD/GI: Normal bowel sounds; non-distended; soft, non-tender, no rebound, no guarding BACK:  The back appears normal and is non-tender to palpation, there is no CVA tenderness EXT: Normal ROM in all joints; non-tender to palpation; mild peripheral edema in her upper and lower extremities bilaterally; normal capillary refill; no cyanosis    SKIN: Normal color for age and race; warm NEURO: Moves all extremities equally PSYCH: The patient's mood and manner are appropriate. Grooming and personal hygiene are appropriate.  MEDICAL DECISION MAKING: Patient here with tachypnea, labored breathing that is intermittent has been present since last night. She is hemodynamically stable. No hypoxia. No respiratory distress. Concern for possible CHF exacerbation. We'll obtain cardiac labs but also d-dimer to rule out pulmonary embolus. Patient may need admission  For IV diuresis.  ED PROGRESS: Patient's first troponin is negative. Her BNP is elevated but her chest x-ray shows no pulmonary edema. She does have very mild peripheral edema on exam is talked to her cardiologist about increasing her Lasix for the next the. She is elevated d-dimer but CT of her chest shows no pulmonary embolus. There is also no edema or infiltrate seen on CT scan. She does have small bilateral pleural effusions. Repeat second set of cardiac enzymes. If negative, I feel patient can be discharged him if she has no current complaints and denies any chest pain or shortness of breath. Her intermittent episodes of tachypnea have resolved she has had no  further episodes since my initial evaluation.  Patient's weight today is only slightly increased at 131.     8:45 PM  Pt's repeat troponin is negative. She is asymptomatic still and hemodynamically stable. Family is comfortable with plan to be discharged home. Have given strict return precautions. They will followup with her cardiologist at the end of the week. They verbalize understanding and are comfortable plan.  We will continue to double up on her Lasix for the next week per the cardiologist's recommendation.    EKG Interpretation  Date/Time:  Tuesday December 02 2013 15:23:35 EST Ventricular Rate:  75 PR Interval:    QRS Duration: 100 QT Interval:  379 QTC Calculation: 423 R Axis:   -37 Text Interpretation:  Atrial fibrillation Left axis deviation Anteroseptal infarct, old Repol abnrm suggests ischemia, anterolateral No significant change since last tracing Confirmed by WARD,  DO, KRISTEN ([id number])  on 12/02/2013 3:29:36 PM          Layla Maw Ward, DO 12/02/13 2047

## 2013-12-02 NOTE — ED Notes (Signed)
Md Ward at bedside.

## 2013-12-02 NOTE — ED Notes (Signed)
Pt weights 131.1 lbs, per family that is appx a 10 lb weight gain over the last month

## 2013-12-02 NOTE — ED Notes (Signed)
MD (Ward) at bedside. 

## 2013-12-02 NOTE — Telephone Encounter (Signed)
New message   Patient son asking does MD need to make a change to patient medication.    Additional lasix was given last night. Edema has improve .

## 2013-12-02 NOTE — ED Notes (Signed)
Per EMS: Per EMS, pt sent here by son for SOB. Pt denies any complaint, including CP, SOB. 137/76. A fib 60-100's (hx of same). AO at baseline.  CBG 130. RR 30. 100% RA. Elevation noted in AVR, V1 and V2. Hx: CHF.

## 2013-12-02 NOTE — Telephone Encounter (Signed)
Son called back and said his Mom is having really difficult breathing and is clammy.  Wants to take her to ER.  States he can't get her in car.  Advised to call 911. Advised I would notify hospital ([facility]) and Dr. Clifton James.

## 2013-12-02 NOTE — Discharge Instructions (Signed)
Peripheral Edema You have swelling in your legs (peripheral edema). This swelling is due to excess accumulation of salt and water in your body. Edema may be a sign of heart, kidney or liver disease, or a side effect of a medication. It may also be due to problems in the leg veins. Elevating your legs and using special support stockings may be very helpful, if the cause of the swelling is due to poor venous circulation. Avoid long periods of standing, whatever the cause. Treatment of edema depends on identifying the cause. Chips, pretzels, pickles and other salty foods should be avoided. Restricting salt in your diet is almost always needed. Water pills (diuretics) are often used to remove the excess salt and water from your body via urine. These medicines prevent the kidney from reabsorbing sodium. This increases urine flow. Diuretic treatment may also result in lowering of potassium levels in your body. Potassium supplements may be needed if you have to use diuretics daily. Daily weights can help you keep track of your progress in clearing your edema. You should call your caregiver for follow up care as recommended. SEEK IMMEDIATE MEDICAL CARE IF:   You have increased swelling, pain, redness, or heat in your legs.  You develop shortness of breath, especially when lying down.  You develop chest or abdominal pain, weakness, or fainting.  You have a fever. Document Released: 10/26/2004 Document Revised: 12/11/2011 Document Reviewed: 10/06/2009 [facility] Patient Information 2014 [place], [place]. Heart Failure Heart failure is a condition in which the heart has trouble pumping blood. This means your heart does not pump blood efficiently for your body to work well. In some cases of heart failure, fluid may back up into your lungs or you may have swelling (edema) in your lower legs. Heart failure is usually a long-term (chronic) condition. It is important for you to take good care of yourself and follow  your caregiver's treatment plan. CAUSES  Some health conditions can cause heart failure. Those health conditions include:  High blood pressure (hypertension) causes the heart muscle to work harder than normal. When pressure in the blood vessels is high, the heart needs to pump (contract) with more force in order to circulate blood throughout the body. High blood pressure eventually causes the heart to become stiff and weak.  Coronary artery disease (CAD) is the buildup of cholesterol and fat (plaque) in the arteries of the heart. The blockage in the arteries deprives the heart muscle of oxygen and blood. This can cause chest pain and may lead to a heart attack. High blood pressure can also contribute to CAD.  Heart attack (myocardial infarction) occurs when 1 or more arteries in the heart become blocked. The loss of oxygen damages the muscle tissue of the heart. When this happens, part of the heart muscle dies. The injured tissue does not contract as well and weakens the heart's ability to pump blood.  Abnormal heart valves can cause heart failure when the heart valves do not open and close properly. This makes the heart muscle pump harder to keep the blood flowing.  Heart muscle disease (cardiomyopathy or myocarditis) is damage to the heart muscle from a variety of causes. These can include drug or alcohol abuse, infections, or unknown reasons. These can increase the risk of heart failure.  Lung disease makes the heart work harder because the lungs do not work properly. This can cause a strain on the heart, leading it to fail.  Diabetes increases the risk of heart failure. High  blood sugar contributes to high fat (lipid) levels in the blood. Diabetes can also cause slow damage to tiny blood vessels that carry important nutrients to the heart muscle. When the heart does not get enough oxygen and food, it can cause the heart to become weak and stiff. This leads to a heart that does not contract  efficiently.  Other conditions can contribute to heart failure. These include abnormal heart rhythms, thyroid problems, and low blood counts (anemia). Certain unhealthy behaviors can increase the risk of heart failure. Those unhealthy behaviors include:  Being overweight.  Smoking or chewing tobacco.  Eating foods high in fat and cholesterol.  Abusing illicit drugs or alcohol.  Lacking physical activity. SYMPTOMS  Heart failure symptoms may vary and can be hard to detect. Symptoms may include:  Shortness of breath with activity, such as climbing stairs.  Persistent cough.  Swelling of the feet, ankles, legs, or abdomen.  Unexplained weight gain.  Difficulty breathing when lying flat (orthopnea).  Waking from sleep because of the need to sit up and get more air.  Rapid heartbeat.  Fatigue and loss of energy.  Feeling lightheaded, dizzy, or close to fainting.  Loss of appetite.  Nausea.  Increased urination during the night (nocturia). DIAGNOSIS  A diagnosis of heart failure is based on your history, symptoms, physical examination, and diagnostic tests. Diagnostic tests for heart failure may include:  Echocardiography.  Electrocardiography.  Chest X-ray.  Blood tests.  Exercise stress test.  Cardiac angiography.  Radionuclide scans. TREATMENT  Treatment is aimed at managing the symptoms of heart failure. Medicines, behavioral changes, or surgical intervention may be necessary to treat heart failure.  Medicines to help treat heart failure may include:  Angiotensin-converting enzyme (ACE) inhibitors. This type of medicine blocks the effects of a blood protein called angiotensin-converting enzyme. ACE inhibitors relax (dilate) the blood vessels and help lower blood pressure.  Angiotensin receptor blockers. This type of medicine blocks the actions of a blood protein called angiotensin. Angiotensin receptor blockers dilate the blood vessels and help lower  blood pressure.  Water pills (diuretics). Diuretics cause the kidneys to remove salt and water from the blood. The extra fluid is removed through urination. This loss of extra fluid lowers the volume of blood the heart pumps.  Beta blockers. These prevent the heart from beating too fast and improve heart muscle strength.  Digitalis. This increases the force of the heartbeat.  Healthy behavior changes include:  Obtaining and maintaining a healthy weight.  Stopping smoking or chewing tobacco.  Eating heart healthy foods.  Limiting or avoiding alcohol.  Stopping illicit drug use.  Physical activity as directed by your caregiver.  Surgical treatment for heart failure may include:  A procedure to open blocked arteries, repair damaged heart valves, or remove damaged heart muscle tissue.  A pacemaker to improve heart muscle function and control certain abnormal heart rhythms.  An internal cardioverter defibrillator to treat certain serious abnormal heart rhythms.  A left ventricular assist device to assist the pumping ability of the heart. HOME CARE INSTRUCTIONS   Take your medicine as directed by your caregiver. Medicines are important in reducing the workload of your heart, slowing the progression of heart failure, and improving your symptoms.  Do not stop taking your medicine unless directed by your caregiver.  Do not skip any dose of medicine.  Refill your prescriptions before you run out of medicine. Your medicines are needed every day.  Take over-the-counter medicine only as directed by your caregiver  or pharmacist.  Engage in moderate physical activity if directed by your caregiver. Moderate physical activity can benefit some people. The elderly and people with severe heart failure should consult with a caregiver for physical activity recommendations.  Eat heart healthy foods. Food choices should be free of trans fat and low in saturated fat, cholesterol, and salt  (sodium). Healthy choices include fresh or frozen fruits and vegetables, fish, lean meats, legumes, fat-free or low-fat dairy products, and whole grain or high fiber foods. Talk to a dietitian to learn more about heart healthy foods.  Limit sodium if directed by your caregiver. Sodium restriction may reduce symptoms of heart failure in some people. Talk to a dietitian to learn more about heart healthy seasonings.  Use healthy cooking methods. Healthy cooking methods include roasting, grilling, broiling, baking, poaching, steaming, or stir-frying. Talk to a dietitian to learn more about healthy cooking methods.  Limit fluids if directed by your caregiver. Fluid restriction may reduce symptoms of heart failure in some people.  Weigh yourself every day. Daily weights are important in the early recognition of excess fluid. You should weigh yourself every morning after you urinate and before you eat breakfast. Wear the same amount of clothing each time you weigh yourself. Record your daily weight. Provide your caregiver with your weight record.  Monitor and record your blood pressure if directed by your caregiver.  Check your pulse if directed by your caregiver.  Lose weight if directed by your caregiver. Weight loss may reduce symptoms of heart failure in some people.  Stop smoking or chewing tobacco. Nicotine makes your heart work harder by causing your blood vessels to constrict. Do not use nicotine gum or patches before talking to your caregiver.  Schedule and attend follow-up visits as directed by your caregiver. It is important to keep all your appointments.  Limit alcohol intake to no more than 1 drink per day for nonpregnant women and 2 drinks per day for men. Drinking more than that is harmful to your heart. Tell your caregiver if you drink alcohol several times a week. Talk with your caregiver about whether alcohol is safe for you. If your heart has already been damaged by alcohol or you  have severe heart failure, drinking alcohol should be stopped completely.  Stop illicit drug use.  Stay up-to-date with immunizations. It is especially important to prevent respiratory infections through current pneumococcal and influenza immunizations.  Manage other health conditions such as hypertension, diabetes, thyroid disease, or abnormal heart rhythms as directed by your caregiver.  Learn to manage stress.  Plan rest periods when fatigued.  Learn strategies to manage high temperatures. If the weather is extremely hot:  Avoid vigorous physical activity.  Use air conditioning or fans or seek a cooler location.  Avoid caffeine and alcohol.  Wear loose-fitting, lightweight, and light-colored clothing.  Learn strategies to manage cold temperatures. If the weather is extremely cold:  Avoid vigorous physical activity.  Layer clothes.  Wear mittens or gloves, a hat, and a scarf when going outside.  Avoid alcohol.  Obtain ongoing education and support as needed.  Participate or seek rehabilitation as needed to maintain or improve independence and quality of life. SEEK MEDICAL CARE IF:   Your weight increases by 03 lb/1.4 kg in 1 day or 05 lb/2.3 kg in a week.  You have increasing shortness of breath that is unusual for you.  You are unable to participate in your usual physical activities.  You tire easily.  You cough  more than normal, especially with physical activity.  You have any or more swelling in areas such as your hands, feet, ankles, or abdomen.  You are unable to sleep because it is hard to breathe.  You feel like your heart is beating fast (palpitations).  You become dizzy or lightheaded upon standing up. SEEK IMMEDIATE MEDICAL CARE IF:   You have difficulty breathing.  There is a change in mental status such as decreased alertness or difficulty with concentration.  You have a pain or discomfort in your chest.  You have an episode of fainting  (syncope). MAKE SURE YOU:   Understand these instructions.  Will watch your condition.  Will get help right away if you are not doing well or get worse. Document Released: 09/18/2005 Document Revised: 01/13/2013 Document Reviewed: 10/10/2012 [facility] Patient Information 2014 [place], [place].

## 2013-12-02 NOTE — Telephone Encounter (Signed)
Agree. Dennie Bible, can we check on her Wednesday morning to make sure she is feeling better? Thanks, chris

## 2013-12-02 NOTE — Telephone Encounter (Signed)
Son calling to say his mother is some better with edema.  SOB some better.  States still has some fluid around buttocks, forearms, and hands.  No swelling in feet but they keep them elevated all the time.  States they found out the caregiver on the weekends has been giving her pancakes which have over 600 mg NA per pancake.  They threw that box away and have tried to talk to caregivers about watching her NA intact.  Unable to weight her because she can't bear any wt on feet. Son is wanting to know if should continue to give the extra Lasix for a day or so. Advised that Dr. Clifton James is out of office today but will send him a message for advice on continuing Lasix.  She is taking  Lasix 40 mg daily with extra dose last PM.

## 2013-12-02 NOTE — ED Notes (Signed)
Pt IV (20g) removed from left [facility]

## 2013-12-03 NOTE — Telephone Encounter (Signed)
Follow up ° ° ° ° °Returned nurses call °

## 2013-12-03 NOTE — Telephone Encounter (Signed)
Agree. cdm 

## 2013-12-03 NOTE — Telephone Encounter (Signed)
Pt was seen in ED and discharged.  I placed call to son to check on pt.  Left message to call back

## 2013-12-03 NOTE — Telephone Encounter (Signed)
Spoke with pt's son. He reports pt is feeling better. I offered appointment for pt to see Dr. Clifton James this Friday but son does not feel she needs to be seen at this time.  Pt will continue extra dose of Lasix through Saturday and then resume normal dosing.  Son reports pt does appear to be anxious at times and has shortness of breath with this.  He will contact primary care to discuss possibility of starting anti anxiety medication.  Appt made for pt to see Dr. Clifton James for follow up on January 16, 2014 at 10:00

## 2013-12-09 ENCOUNTER — Telehealth: Payer: Self-pay | Admitting: Cardiovascular Disease

## 2013-12-09 NOTE — Telephone Encounter (Signed)
Lasix was increased to twice daily from 3/2-3/7 then pt resumed normal dosing. Lab work was done in ED on 3/3 and stable. I spoke with pt's son who states pt's caregivers report she has been feeling more tired and listless today. I told son we could schedule pt for lab work in our office.  Son then requested pt see provider while here for lab work.  Main complaints are tiredness and pain in hands. No CHF symptoms.  I told pt's son it would be best for pt to see primary care for these problems.  He will contact Dr. Laury Axon. He is aware blood work can be checked by their office.

## 2013-12-09 NOTE — Telephone Encounter (Signed)
New message    Son calling asking should his mother have a blood test. Due to increase in lasix.

## 2013-12-23 ENCOUNTER — Ambulatory Visit (HOSPITAL_BASED_OUTPATIENT_CLINIC_OR_DEPARTMENT_OTHER)
Admission: RE | Admit: 2013-12-23 | Discharge: 2013-12-23 | Disposition: A | Discharge status: Home / Self Care | Payer: Medicare Other | Source: Ambulatory Visit | Attending: Internal Medicine | Admitting: Internal Medicine

## 2013-12-23 ENCOUNTER — Ambulatory Visit (INDEPENDENT_AMBULATORY_CARE_PROVIDER_SITE_OTHER): Payer: Medicare Other | Admitting: Internal Medicine

## 2013-12-23 ENCOUNTER — Encounter: Payer: Self-pay | Admitting: Internal Medicine

## 2013-12-23 VITALS — BP 117/72 | HR 87 | Temp 98.5°F

## 2013-12-23 DIAGNOSIS — R0602 Shortness of breath: Secondary | ICD-10-CM

## 2013-12-23 DIAGNOSIS — J90 Pleural effusion, not elsewhere classified: Secondary | ICD-10-CM | POA: Insufficient documentation

## 2013-12-23 DIAGNOSIS — I517 Cardiomegaly: Secondary | ICD-10-CM | POA: Insufficient documentation

## 2013-12-23 MED ORDER — CEFUROXIME AXETIL 500 MG PO TABS: 500.0000 mg | ORAL_TABLET | Freq: Two times a day (BID) | ORAL | Status: DC

## 2013-12-23 NOTE — Progress Notes (Signed)
Pre visit review using our clinic review tool, if applicable. No additional management support is needed unless otherwise documented below in the visit note. 

## 2013-12-23 NOTE — Progress Notes (Signed)
Subjective:    Patient ID: Deborah CowboyJoan Rowe, female    DOB: 1928-02-20, 80 y.o.   MRN: [id number]  DOS:  12/23/2013 Type of  visit: Acute visit  here with her son and one of the caregivers. Yesterday she was okay; today according to the caregiver she looks tired and she is "breathing heavy". They have noted some cough today. The patient has advanced dementia, lives at home, there is always an aid with her. Is hard to get much information from the patient.  ROS They have not noticed any fever or chills. The patient herself denies any pain. Appetite today has been decreased. There is no nausea, vomiting, diarrhea. No chest congestion per se.  Past Medical History  Diagnosis Date  . Hypertension   . Osteoporosis   . Overactive bladder   . Chronic systolic heart failure 05/20/2013    a. Echo (8/14):  mild LVH, EF 35%, diff HK, mild MR  . Atrial fibrillation     not a coumadin candidate  . Dementia   . Arthritis   . CAD (coronary artery disease)     a. LHC (8/14):  prox to mid LAD 80, pRCA 30, pPDA 30-40, EF 50%  . Hyponatremia   . NICM (nonischemic cardiomyopathy)     Past Surgical History  Procedure Laterality Date  . Abdominal hysterectomy      TAH BSO  . Spine surgery  1980    Lumbar surgery    History   Social History  . Marital Status: Married    Spouse Name: N/A    Number of Children: N/A  . Years of Education: N/A   Occupational History  . retired     Engineer, civil (consulting)nurse  . volunteers at hospice    Social History Main Topics  . Smoking status: Never Smoker   . Smokeless tobacco: Never Used  . Alcohol Use: No  . Drug Use: No  . Sexual Activity: No   Other Topics Concern  . Not on file   Social History Narrative  . No narrative on file        Medication List       This list is accurate as of: 12/23/13 11:59 PM.  Always use your most recent med list.               acetaminophen 500 MG tablet  Commonly known as:  TYLENOL  Take 250 mg by mouth every 6  (six) hours as needed for moderate pain.     albuterol 108 (90 BASE) MCG/ACT inhaler  Commonly known as:  PROAIR HFA  Inhale 2 puffs into the lungs every 6 (six) hours as needed for wheezing or shortness of breath.     ALOE VESTA SKIN PROTECTANT EX  Apply 1 application topically 3 (three) times daily.     aspirin 81 MG EC tablet  Take 1 tablet (81 mg total) by mouth daily.     atorvastatin 20 MG tablet  Commonly known as:  LIPITOR  Take 20 mg by mouth daily at 6 PM.     carvedilol 3.125 MG tablet  Commonly known as:  COREG  Take 1 tablet (3.125 mg total) by mouth 2 (two) times daily with a meal.     cefUROXime 500 MG tablet  Commonly known as:  CEFTIN  Take 1 tablet (500 mg total) by mouth 2 (two) times daily with a meal.     CENTRUM SILVER ADULT 50+ Tabs  Take 1 tablet by mouth daily.  digoxin 0.125 MG tablet  Commonly known as:  LANOXIN  Take 1 tablet (0.125 mg total) by mouth daily.     donepezil 5 MG tablet  Commonly known as:  ARICEPT  Take 5 mg by mouth at bedtime.     furosemide 40 MG tablet  Commonly known as:  LASIX  Take 1 tablet (40 mg total) by mouth daily.     MIRALAX powder  Generic drug:  polyethylene glycol powder  Take 17 g by mouth daily.     MUSCLE RUB EX  Apply 1 application topically 3 (three) times daily as needed (for pain).     oxybutynin 5 MG 24 hr tablet  Commonly known as:  DITROPAN-XL  TAKE 1 TABLET BY MOUTH EVERY DAY     QUEtiapine 100 MG tablet  Commonly known as:  SEROQUEL  Take 1 tablet (100 mg total) by mouth at bedtime.     traMADol 50 MG tablet  Commonly known as:  ULTRAM  Take 1 tablet (50 mg total) by mouth every 8 (eight) hours as needed.           Objective:   Physical Exam BP 117/72  Pulse 87  Temp(Src) 98.5 F (36.9 C)  SpO2 98% General -- alert,  pleasently demented, sitting in her wheelchair, not in physical or emotional distress  Neck -- Unable to assess JVD, she could not get on the table HEENT--  Not pale.   Lungs -- normal respiratory effort, no intercostal retractions, no accessory muscle use, Few dry crackles at both bases; Respiratory rate 21, did have tachypnea Intermittently Heart--irreg   Extremities-- trace pretibial edema bilaterally  Neurologic--  alert , Follows simple commands Psych--   No anxious or depressed appearing.      Assessment & Plan:   80 year old lady here because he looks tired and she is "breathing heavy". Physical exam  is limited, she does not look toxic or very ill. Went to the ER this month, BNP was elevated, d-dimer positive, chest x-ray showed no acute pulmonary edema but bilateral pleural effusions, CT confirmed the pleural effusion, showed no PE. BMP is satisfactory DDX is large including vol overload, bronchitis, pneumonia, CAD, MI etc. I discussed the situation with her son, we agreed to treat as conservatively as possible. Plan: Chest x-ray--- call results to [facility] [phone] Increase Lasix dose Temporarily Z-Pak ER or call if she's not improving soon.

## 2013-12-23 NOTE — Patient Instructions (Signed)
Get the XR at [facility] [place], corner of [address] (10 minutes form here); they are open 24/7 [address]  [place], [place] [address] [phone]  Do a single view only is unable to stand.  Start taking the antibiotic as prescribed  Lasix 40 mg every day, for the next 4 day take an additional tablet of Lasix at 2 PM.  Followup with primary MD  In one week  ER or call if ------------> fever, chills, lower extremity swelling, increased difficulty breathing.

## 2013-12-26 ENCOUNTER — Other Ambulatory Visit: Payer: Self-pay | Admitting: Family Medicine

## 2014-01-16 ENCOUNTER — Encounter: Payer: Self-pay | Admitting: Cardiovascular Disease

## 2014-01-16 ENCOUNTER — Ambulatory Visit (INDEPENDENT_AMBULATORY_CARE_PROVIDER_SITE_OTHER): Payer: Medicare Other | Admitting: Cardiovascular Disease

## 2014-01-16 VITALS — BP 120/60 | HR 70 | Ht 66.0 in | Wt 139.0 lb

## 2014-01-16 DIAGNOSIS — I4891 Unspecified atrial fibrillation: Secondary | ICD-10-CM

## 2014-01-16 DIAGNOSIS — I5022 Chronic systolic (congestive) heart failure: Secondary | ICD-10-CM

## 2014-01-16 DIAGNOSIS — I5043 Acute on chronic combined systolic (congestive) and diastolic (congestive) heart failure: Secondary | ICD-10-CM

## 2014-01-16 DIAGNOSIS — I2510 Atherosclerotic heart disease of native coronary artery without angina pectoris: Secondary | ICD-10-CM

## 2014-01-16 MED ORDER — FUROSEMIDE 40 MG PO TABS: 40.0000 mg | ORAL_TABLET | Freq: Two times a day (BID) | ORAL | Status: DC

## 2014-01-16 NOTE — Patient Instructions (Signed)
Your physician wants you to follow-up in:  3 months. You will receive a reminder letter in the mail two months in advance. If you don't receive a letter, please call our office to schedule the follow-up appointment.  Your physician recommends that you have lab work done on January 27, 2014 at 10:15 at [facility] in [place]  Your physician has recommended you make the following change in your medication:  Increase furosemide to 40 mg by mouth twice daily

## 2014-01-16 NOTE — Progress Notes (Signed)
History of Present Illness: 80 yo female with history of HTN, atrial fibrillation, dementia, CAD, non-ischemic cardiomyopathy here today for follow up. She was admitted to [facility] 05/20/13 with volume overload, atrial fibrillation with RVR. Echo 05/21/13 with dilated LV cavity, LVEF 35%, mild MR. Cardiac cath 05/22/13 per Dr. Swaziland with 80% proximal LAD stenosis, 30% RCA stenosis. Medical management was recommended. She was diuresed with IV Lasix and was rate controlled with low dose Coreg and Digoxin. She was not felt to be a good candidate for long term anti-coagulation given advanced age and dementia. She did develop pulmonary edema at home which improved with higher dose of Lasix.   She is here today for follow up. No chest pain or SOB. Feels well. No complaints. Weight up 8 lbs over last 6 weeks.   Primary Care Physician: Laury Axon  Last Lipid Profile:Lipid Panel     Component Value Date/Time   CHOL 126 05/23/2013 0500   TRIG 57 05/23/2013 0500   HDL 57 05/23/2013 0500   CHOLHDL 2.2 05/23/2013 0500   VLDL 11 05/23/2013 0500   LDLCALC 58 05/23/2013 0500     Past Medical History  Diagnosis Date  . Hypertension   . Osteoporosis   . Overactive bladder   . Chronic systolic heart failure 05/20/2013    a. Echo (8/14):  mild LVH, EF 35%, diff HK, mild MR  . Atrial fibrillation     not a coumadin candidate  . Dementia   . Arthritis   . CAD (coronary artery disease)     a. LHC (8/14):  prox to mid LAD 80, pRCA 30, pPDA 30-40, EF 50%  . Hyponatremia   . NICM (nonischemic cardiomyopathy)     Past Surgical History  Procedure Laterality Date  . Abdominal hysterectomy      TAH BSO  . Spine surgery  1980    Lumbar surgery    Current Outpatient Prescriptions  Medication Sig Dispense Refill  . acetaminophen (TYLENOL) 500 MG tablet Take 250 mg by mouth every 6 (six) hours as needed for moderate pain.      Marland Kitchen albuterol (PROAIR HFA) 108 (90 BASE) MCG/ACT inhaler Inhale 2 puffs into the lungs every  6 (six) hours as needed for wheezing or shortness of breath.  1 Inhaler  5  . aspirin EC 81 MG EC tablet Take 1 tablet (81 mg total) by mouth daily.  30 tablet  0  . atorvastatin (LIPITOR) 20 MG tablet Take 20 mg by mouth daily at 6 PM.      . carvedilol (COREG) 3.125 MG tablet Take 1 tablet (3.125 mg total) by mouth 2 (two) times daily with a meal.  60 tablet  6  . cefUROXime (CEFTIN) 500 MG tablet Take 1 tablet (500 mg total) by mouth 2 (two) times daily with a meal.  14 tablet  0  . digoxin (LANOXIN) 0.125 MG tablet Take 1 tablet (0.125 mg total) by mouth daily.  30 tablet  6  . donepezil (ARICEPT) 5 MG tablet Take 5 mg by mouth at bedtime.      . furosemide (LASIX) 40 MG tablet Take 1 tablet (40 mg total) by mouth daily.  30 tablet  6  . Menthol-Methyl Salicylate (MUSCLE RUB EX) Apply 1 application topically 3 (three) times daily as needed (for pain).      . Multiple Vitamins-Minerals (CENTRUM SILVER ADULT 50+) TABS Take 1 tablet by mouth daily.      Marland Kitchen oxybutynin (DITROPAN-XL) 5 MG 24  hr tablet TAKE 1 TABLET BY MOUTH EVERY DAY  30 tablet  11  . Petrolatum (ALOE VESTA SKIN PROTECTANT EX) Apply 1 application topically 3 (three) times daily.      . polyethylene glycol powder (MIRALAX) powder Take 17 g by mouth daily.       . QUEtiapine (SEROQUEL) 100 MG tablet TAKE 1 TABLET BY MOUTH EVERY NIGHT AT BEDTIME  30 tablet  11  . traMADol (ULTRAM) 50 MG tablet Take 1 tablet (50 mg total) by mouth every 8 (eight) hours as needed.  30 tablet  3   No current facility-administered medications for this visit.    No Known Allergies  History   Social History  . Marital Status: Married    Spouse Name: N/A    Number of Children: N/A  . Years of Education: N/A   Occupational History  . retired     Engineer, civil (consulting)nurse  . volunteers at hospice    Social History Main Topics  . Smoking status: Never Smoker   . Smokeless tobacco: Never Used  . Alcohol Use: No  . Drug Use: No  . Sexual Activity: No   Other  Topics Concern  . Not on file   Social History Narrative  . No narrative on file    Family History  Problem Relation Age of Onset  . Depression    . Heart attack    . Coronary artery disease    . Coronary artery disease Mother 2569  . Heart attack Son 5651    Review of Systems:  As stated in the HPI and otherwise negative.   BP 120/60  Pulse 70  Ht 5\' 6"  (1.676 m)  Wt 139 lb (63.05 kg)  BMI 22.45 kg/m2  Physical Examination: General: Well developed, well nourished, NAD HEENT: OP clear, mucus membranes moist SKIN: warm, dry. No rashes. Neuro: No focal deficits Musculoskeletal: Muscle strength 5/5 all ext Psychiatric: Mood and affect normal Neck: No JVD, no carotid bruits, no thyromegaly, no lymphadenopathy. Lungs:Clear bilaterally, no wheezes, rhonci, crackles Cardiovascular: Regular rate and rhythm. No murmurs, gallops or rubs. Abdomen:Soft. Bowel sounds present. Non-tender.  Extremities: No lower extremity edema. Pulses are 2 + in the bilateral DP/PT.  Echo 05/21/13:  Left ventricle: The cavity size was mildly dilated. Wall thickness was increased in a pattern of mild LVH. The estimated ejection fraction was 35%. Diffuse hypokinesis. - Mitral valve: Mild regurgitation. - Atrial septum: No defect or patent foramen ovale was Identified.   Cardiac cath 05/22/13:  Left mainstem: Normal  Left anterior descending (LAD): The LAD is moderately calcified. There is an 80% stenosis in the proximal to mid vessel. The distal LAD appears relatively small.  There is a large ramus intermediate branch that trifurcates on the lateral wall. It is normal.  Left circumflex (LCx): The left circumflex is a tiny vessel that terminates early in the AV groove.  Right coronary artery (RCA): The RCA is a large dominant vessel. It gives rise to 3 posterolateral branches and the PDA. There is 30% disease in the proximal RCA. There is 30-40% disease in the proximal PDA.  Left ventriculography: Left  ventricular systolic function is abnormal, LVEF is estimated at 50%, there is mild global hypokinesis.There is no significant mitral regurgitation  Final Conclusions:  1. Single vessel obstructive CAD involving the LAD.  2. Low normal LV systolic function. EF 50%.  3. Low LV filling pressures.   Assessment and Plan:   1. CAD: Stable. No angina. Continue current meds.  2. Atrial fibrillation: Rate controlled on Coreg and digoxin. She is not on anti-coagulation after long discussions regarding this with pt and family. Risk of anti-coagulation felt to outweigh potential benefit given advanced age and dementia.   3. Acute and Chronic systolic CHF: LVEF is 35%. Weight is up but no clinical signs of volume overload. Will increase Lasix to 40 mg po BID. 1.5 liter fluid restriction. Follow daily weights. BMET in 10-14 days.   4. Non-ischemic Cardiomyopathy: Medical management. Not ICD candidate given advanced age and dementia.

## 2014-01-27 ENCOUNTER — Other Ambulatory Visit: Payer: Medicare Other

## 2014-01-27 ENCOUNTER — Telehealth: Payer: Self-pay | Admitting: Cardiovascular Disease

## 2014-01-27 ENCOUNTER — Other Ambulatory Visit (INDEPENDENT_AMBULATORY_CARE_PROVIDER_SITE_OTHER): Payer: Medicare Other

## 2014-01-27 DIAGNOSIS — I2510 Atherosclerotic heart disease of native coronary artery without angina pectoris: Secondary | ICD-10-CM

## 2014-01-27 DIAGNOSIS — I5022 Chronic systolic (congestive) heart failure: Secondary | ICD-10-CM

## 2014-01-27 LAB — BASIC METABOLIC PANEL
BUN: 23 mg/dL (ref 6–23)
CALCIUM: 8.9 mg/dL (ref 8.4–10.5)
CO2: 29 mEq/L (ref 19–32)
CREATININE: 0.8 mg/dL (ref 0.4–1.2)
Chloride: 96 mEq/L (ref 96–112)
GFR: 78.21 mL/min (ref >60.00)
GLUCOSE: 127 mg/dL — AB (ref 70–99)
Potassium: 3.9 mEq/L (ref 3.5–5.1)
Sodium: 133 mEq/L — ABNORMAL LOW (ref 135–145)

## 2014-01-27 NOTE — Telephone Encounter (Signed)
Spoke with pt's son. He reports caregivers called him this morning and reported pt was lethargic and they had to coax her to get up and get moving. Son went to check on pt and is currently with her.  Son reports pt has gotten up and is dressed and has eaten lunch. She is answering questions appropriately but is still sleepy. No shortness of breath.  Unable to get accurate weights. Symptoms of lethargy started today. Pt going for lab work today at primary care that was ordered by Dr. Clifton James. Son reports no available appointments today at primary care for office visit.  After reviewing with Dr. Clifton James I instructed son he should take pt to urgent care for evaluation today.

## 2014-01-27 NOTE — Telephone Encounter (Signed)
Patients son called in she is on a higher dose of Lasix than normal. She seems to be latargic today. He is very concerned. Please call and advise.

## 2014-02-13 ENCOUNTER — Other Ambulatory Visit: Payer: Self-pay | Admitting: Cardiovascular Disease

## 2014-02-22 ENCOUNTER — Other Ambulatory Visit: Payer: Self-pay | Admitting: Cardiovascular Disease

## 2014-03-04 ENCOUNTER — Telehealth: Payer: Self-pay

## 2014-03-04 NOTE — Telephone Encounter (Signed)
Prolia benefits have been verified. The patient is subject to a 20% co-insurance up to a $6,700 out of pocket max ($427 met) If an office visit is billed a $50 co-pay will apply. Once net, coverage increases to 100%. No deductible applies.  No PA is required for this procedure. i called and spoke with Lauro Regulus and he declined the Prolia injection at this time.      KP

## 2014-03-21 ENCOUNTER — Other Ambulatory Visit: Payer: Self-pay | Admitting: Cardiovascular Disease

## 2014-03-26 ENCOUNTER — Telehealth: Payer: Self-pay | Admitting: Family Medicine

## 2014-03-26 MED ORDER — NYSTATIN 100000 UNIT/ML MT SUSP: 5.0000 mL | Freq: Four times a day (QID) | OROMUCOSAL | Status: DC

## 2014-03-26 NOTE — Telephone Encounter (Signed)
Caller name: Nicholos Johns  Relation to pt: daughter Call back number: [phone] Pharmacy:[id number] - [place], [place] - [address] AT [facility] [address]   Reason for call: Patient's daughter called stating that Kamri has this thick discolored coat on her tongue. Daughter's thinks its thrush and would like something called in for her because she is unable to get her in here.

## 2014-03-26 NOTE — Telephone Encounter (Signed)
Last seen 10/31/13. Please advise    KP

## 2014-03-26 NOTE — Telephone Encounter (Signed)
Rx faxed and daughter has been made aware.   KP

## 2014-03-26 NOTE — Telephone Encounter (Signed)
Nystatin swish and spit 5 ml po qid   150  cc

## 2014-04-23 ENCOUNTER — Other Ambulatory Visit: Payer: Self-pay | Admitting: Cardiovascular Disease

## 2014-05-07 ENCOUNTER — Ambulatory Visit: Payer: Medicare Other | Admitting: Cardiovascular Disease

## 2014-05-13 ENCOUNTER — Encounter: Payer: Self-pay | Admitting: Cardiovascular Disease

## 2014-05-13 ENCOUNTER — Ambulatory Visit (INDEPENDENT_AMBULATORY_CARE_PROVIDER_SITE_OTHER): Payer: Medicare Other | Admitting: Cardiovascular Disease

## 2014-05-13 ENCOUNTER — Telehealth: Payer: Self-pay | Admitting: Family Medicine

## 2014-05-13 VITALS — BP 100/62 | HR 44 | Ht 64.0 in | Wt 129.0 lb

## 2014-05-13 DIAGNOSIS — I4891 Unspecified atrial fibrillation: Secondary | ICD-10-CM

## 2014-05-13 DIAGNOSIS — I4819 Other persistent atrial fibrillation: Secondary | ICD-10-CM

## 2014-05-13 DIAGNOSIS — I2510 Atherosclerotic heart disease of native coronary artery without angina pectoris: Secondary | ICD-10-CM

## 2014-05-13 DIAGNOSIS — I428 Other cardiomyopathies: Secondary | ICD-10-CM

## 2014-05-13 DIAGNOSIS — I5022 Chronic systolic (congestive) heart failure: Secondary | ICD-10-CM

## 2014-05-13 NOTE — Telephone Encounter (Signed)
Detailed message left advising apt needed for evaluation of cough and a called back is requested to schedule.      KP

## 2014-05-13 NOTE — Patient Instructions (Signed)
Your physician wants you to follow-up in:  6 months. You will receive a reminder letter in the mail two months in advance. If you don't receive a letter, please call our office to schedule the follow-up appointment.   

## 2014-05-13 NOTE — Telephone Encounter (Signed)
Caller name: Velna Hatchet Relation to pt: Daughter  Call back number: [phone] Pharmacy: [facility] [phone]  Reason for call: pt has severe cough, coughing up flem for a couple days would like a script for antibiotic to be called into pharmacy.

## 2014-05-13 NOTE — Progress Notes (Signed)
History of Present Illness: 80 yo female with history of HTN, atrial fibrillation, dementia, CAD, non-ischemic cardiomyopathy here today for follow up. She was admitted to [facility] 05/20/13 with volume overload, atrial fibrillation with RVR. Echo 05/21/13 with dilated LV cavity, LVEF 35%, mild MR. Cardiac cath 05/22/13 per Dr. SwazilandJordan with 80% proximal LAD stenosis, 30% RCA stenosis. Medical management was recommended. She was diuresed with IV Lasix and was rate controlled with low dose Coreg and Digoxin. She was not felt to be a good candidate for long term anti-coagulation given advanced age and dementia. She did develop pulmonary edema at home which improved with higher dose of Lasix.   She is here today for follow up. No chest pain or SOB. Feels well. No complaints. Weight is stable. Her daughter and caregiver are here today.   Primary Care Physician: Laury AxonLowne  Last Lipid Profile:Lipid Panel     Component Value Date/Time   CHOL 126 05/23/2013 0500   TRIG 57 05/23/2013 0500   HDL 57 05/23/2013 0500   CHOLHDL 2.2 05/23/2013 0500   VLDL 11 05/23/2013 0500   LDLCALC 58 05/23/2013 0500    Past Medical History  Diagnosis Date  . Hypertension   . Osteoporosis   . Overactive bladder   . Chronic systolic heart failure 05/20/2013    a. Echo (8/14):  mild LVH, EF 35%, diff HK, mild MR  . Atrial fibrillation     not a coumadin candidate  . Dementia   . Arthritis   . CAD (coronary artery disease)     a. LHC (8/14):  prox to mid LAD 80, pRCA 30, pPDA 30-40, EF 50%  . Hyponatremia   . NICM (nonischemic cardiomyopathy)     Past Surgical History  Procedure Laterality Date  . Abdominal hysterectomy      TAH BSO  . Spine surgery  1980    Lumbar surgery    Current Outpatient Prescriptions  Medication Sig Dispense Refill  . acetaminophen (TYLENOL) 500 MG tablet Take 250 mg by mouth every 6 (six) hours as needed for moderate pain.      Marland Kitchen. albuterol (PROAIR HFA) 108 (90 BASE) MCG/ACT inhaler Inhale 2  puffs into the lungs every 6 (six) hours as needed for wheezing or shortness of breath.  1 Inhaler  5  . aspirin EC 81 MG EC tablet Take 1 tablet (81 mg total) by mouth daily.  30 tablet  0  . atorvastatin (LIPITOR) 20 MG tablet Take 20 mg by mouth daily at 6 PM.      . carvedilol (COREG) 3.125 MG tablet TAKE 1 TABLET BY MOUTH TWICE DAILY WITH A MEAL  60 tablet  2  . DIGOX 125 MCG tablet TAKE 1 TABLET BY MOUTH DAILY  30 tablet  2  . donepezil (ARICEPT) 5 MG tablet Take 5 mg by mouth at bedtime.      . furosemide (LASIX) 40 MG tablet Take 1 tablet (40 mg total) by mouth 2 (two) times daily.  60 tablet  6  . Menthol-Methyl Salicylate (MUSCLE RUB EX) Apply 1 application topically 3 (three) times daily as needed (for pain).      Marland Kitchen. oxybutynin (DITROPAN-XL) 5 MG 24 hr tablet TAKE 1 TABLET BY MOUTH EVERY DAY  30 tablet  11  . Petrolatum (ALOE VESTA SKIN PROTECTANT EX) Apply 1 application topically 3 (three) times daily.      . polyethylene glycol powder (MIRALAX) powder Take 17 g by mouth daily.       .Marland Kitchen  QUEtiapine (SEROQUEL) 100 MG tablet TAKE 1 TABLET BY MOUTH EVERY NIGHT AT BEDTIME  30 tablet  11  . traMADol (ULTRAM) 50 MG tablet Take 1 tablet (50 mg total) by mouth every 8 (eight) hours as needed.  30 tablet  3   No current facility-administered medications for this visit.    No Known Allergies  History   Social History  . Marital Status: Married    Spouse Name: N/A    Number of Children: N/A  . Years of Education: N/A   Occupational History  . retired     Engineer, civil (consulting)  . volunteers at hospice    Social History Main Topics  . Smoking status: Never Smoker   . Smokeless tobacco: Never Used  . Alcohol Use: No  . Drug Use: No  . Sexual Activity: No   Other Topics Concern  . Not on file   Social History Narrative  . No narrative on file    Family History  Problem Relation Age of Onset  . Depression    . Heart attack    . Coronary artery disease    . Coronary artery disease Mother  45  . Heart attack Son 3    Review of Systems:  As stated in the HPI and otherwise negative.   BP 100/62  Pulse 44  Ht 5\' 4"  (1.626 m)  Wt 129 lb (58.514 kg)  BMI 22.13 kg/m2  Physical Examination: General: Well developed, well nourished, NAD HEENT: OP clear, mucus membranes moist SKIN: warm, dry. No rashes. Neuro: No focal deficits Musculoskeletal: Muscle strength 5/5 all ext Psychiatric: Mood and affect normal Neck: No JVD, no carotid bruits, no thyromegaly, no lymphadenopathy. Lungs:Clear on left with scattered wheezes right lung. No rhonci, crackles Cardiovascular: Irregular irregular. No murmurs, gallops or rubs. Abdomen:Soft. Bowel sounds present. Non-tender.  Extremities: No lower extremity edema. Pulses are 2 + in the bilateral DP/PT.  Echo 05/21/13:  Left ventricle: The cavity size was mildly dilated. Wall thickness was increased in a pattern of mild LVH. The estimated ejection fraction was 35%. Diffuse hypokinesis. - Mitral valve: Mild regurgitation. - Atrial septum: No defect or patent foramen ovale was Identified.   Cardiac cath 05/22/13:  Left mainstem: Normal  Left anterior descending (LAD): The LAD is moderately calcified. There is an 80% stenosis in the proximal to mid vessel. The distal LAD appears relatively small.  There is a large ramus intermediate branch that trifurcates on the lateral wall. It is normal.  Left circumflex (LCx): The left circumflex is a tiny vessel that terminates early in the AV groove.  Right coronary artery (RCA): The RCA is a large dominant vessel. It gives rise to 3 posterolateral branches and the PDA. There is 30% disease in the proximal RCA. There is 30-40% disease in the proximal PDA.  Left ventriculography: Left ventricular systolic function is abnormal, LVEF is estimated at 50%, there is mild global hypokinesis.There is no significant mitral regurgitation  Final Conclusions:  1. Single vessel obstructive CAD involving the LAD.   2. Low normal LV systolic function. EF 50%.  3. Low LV filling pressures.   EKG: Atrial fib, rate 52 bpm. Diffuse ST and T wave abnormalities. Unchanged.   Assessment and Plan:   1. CAD: Stable. No angina. Continue current meds.   2. Atrial fibrillation: Rate controlled on Coreg and digoxin. She is not on anti-coagulation after long discussions regarding this with pt and family. Risk of anti-coagulation felt to outweigh potential benefit given advanced age  and dementia.   3. Acute and Chronic systolic CHF: LVEF is 35%. Will continue Lasix 40 mg po BID. 1.5 liter fluid restriction.  4. Non-ischemic Cardiomyopathy: Medical management. Not ICD candidate given advanced age and dementia.

## 2014-05-14 ENCOUNTER — Ambulatory Visit (HOSPITAL_BASED_OUTPATIENT_CLINIC_OR_DEPARTMENT_OTHER)
Admission: RE | Admit: 2014-05-14 | Discharge: 2014-05-14 | Disposition: A | Discharge status: Home / Self Care | Payer: Medicare Other | Source: Ambulatory Visit | Attending: Family Medicine | Admitting: Family Medicine

## 2014-05-14 ENCOUNTER — Ambulatory Visit (INDEPENDENT_AMBULATORY_CARE_PROVIDER_SITE_OTHER): Payer: Medicare Other | Admitting: Family Medicine

## 2014-05-14 ENCOUNTER — Encounter: Payer: Self-pay | Admitting: Family Medicine

## 2014-05-14 VITALS — BP 130/74 | HR 66 | Temp 97.5°F | Resp 20

## 2014-05-14 DIAGNOSIS — R059 Cough, unspecified: Secondary | ICD-10-CM | POA: Diagnosis not present

## 2014-05-14 DIAGNOSIS — J209 Acute bronchitis, unspecified: Secondary | ICD-10-CM | POA: Diagnosis not present

## 2014-05-14 DIAGNOSIS — R05 Cough: Secondary | ICD-10-CM | POA: Insufficient documentation

## 2014-05-14 DIAGNOSIS — R062 Wheezing: Secondary | ICD-10-CM

## 2014-05-14 MED ORDER — AMOXICILLIN-POT CLAVULANATE 875-125 MG PO TABS: 1.0000 | ORAL_TABLET | Freq: Two times a day (BID) | ORAL | Status: DC

## 2014-05-14 MED ORDER — PREDNISONE 10 MG PO TABS: ORAL_TABLET | ORAL | Status: DC

## 2014-05-14 MED ORDER — ALBUTEROL SULFATE (2.5 MG/3ML) 0.083% IN NEBU
2.5000 mg | INHALATION_SOLUTION | Freq: Once | RESPIRATORY_TRACT | Status: AC
  Administered 2014-05-14: 2.5 mg via RESPIRATORY_TRACT

## 2014-05-14 NOTE — Patient Instructions (Signed)

## 2014-05-14 NOTE — Progress Notes (Signed)
Subjective:     [name] is a 80 y.o. female here for evaluation of a cough. Onset of symptoms was 1 week ago. Symptoms have been gradually worsening since that time. The cough is productive and is aggravated by infection and reclining position. Associated symptoms include: shortness of breath, sputum production and wheezing. Patient does not have a history of asthma. Patient does not have a history of environmental allergens. Patient has not traveled recently. Patient does not have a history of smoking. Patient has not had a previous chest x-ray. Patient has not had a PPD done.  The following portions of the patient's history were reviewed and updated as appropriate:  She  has a past medical history of Hypertension; Osteoporosis; Overactive bladder; Chronic systolic heart failure (05/20/2013); Atrial fibrillation; Dementia; Arthritis; CAD (coronary artery disease); Hyponatremia; and NICM (nonischemic cardiomyopathy). She  does not have any pertinent problems on file. She  has past surgical history that includes Abdominal hysterectomy and Spine surgery (1980). Her family history includes Coronary artery disease in an other family member; Coronary artery disease (age of onset: 4169) in her mother; Depression in an other family member; Heart attack in an other family member; Heart attack (age of onset: 2451) in her son. She  reports that she has never smoked. She has never used smokeless tobacco. She reports that she does not drink alcohol or use illicit drugs. She has a current medication list which includes the following prescription(s): acetaminophen, albuterol, aspirin, atorvastatin, carvedilol, digox, donepezil, furosemide, menthol-methyl salicylate, oxybutynin, petrolatum, polyethylene glycol powder, quetiapine, tramadol, amoxicillin-clavulanate, and prednisone. Current Outpatient Prescriptions on File Prior to Visit  Medication Sig Dispense Refill  . acetaminophen (TYLENOL) 500 MG tablet Take 250  mg by mouth every 6 (six) hours as needed for moderate pain.      Marland Kitchen. albuterol (PROAIR HFA) 108 (90 BASE) MCG/ACT inhaler Inhale 2 puffs into the lungs every 6 (six) hours as needed for wheezing or shortness of breath.  1 Inhaler  5  . aspirin EC 81 MG EC tablet Take 1 tablet (81 mg total) by mouth daily.  30 tablet  0  . atorvastatin (LIPITOR) 20 MG tablet Take 20 mg by mouth daily at 6 PM.      . carvedilol (COREG) 3.125 MG tablet TAKE 1 TABLET BY MOUTH TWICE DAILY WITH A MEAL  60 tablet  2  . DIGOX 125 MCG tablet TAKE 1 TABLET BY MOUTH DAILY  30 tablet  2  . donepezil (ARICEPT) 5 MG tablet Take 5 mg by mouth at bedtime.      . furosemide (LASIX) 40 MG tablet Take 1 tablet (40 mg total) by mouth 2 (two) times daily.  60 tablet  6  . Menthol-Methyl Salicylate (MUSCLE RUB EX) Apply 1 application topically 3 (three) times daily as needed (for pain).      Marland Kitchen. oxybutynin (DITROPAN-XL) 5 MG 24 hr tablet TAKE 1 TABLET BY MOUTH EVERY DAY  30 tablet  11  . Petrolatum (ALOE VESTA SKIN PROTECTANT EX) Apply 1 application topically 3 (three) times daily.      . polyethylene glycol powder (MIRALAX) powder Take 17 g by mouth daily.       . QUEtiapine (SEROQUEL) 100 MG tablet TAKE 1 TABLET BY MOUTH EVERY NIGHT AT BEDTIME  30 tablet  11  . traMADol (ULTRAM) 50 MG tablet Take 1 tablet (50 mg total) by mouth every 8 (eight) hours as needed.  30 tablet  3   No current facility-administered  medications on file prior to visit.   She has No Known Allergies..  Review of Systems Pertinent items are noted in HPI.    Objective:    Oxygen saturation 94% on room air BP 130/74  Pulse 66  Temp(Src) 97.5 F (36.4 C) (Oral)  Resp 20  SpO2 94% General appearance: alert, cooperative, appears stated age and no distress Throat: lips, mucosa, and tongue normal; teeth and gums normal Neck: no adenopathy, supple, symmetrical, trachea midline and thyroid not enlarged, symmetric, no tenderness/mass/nodules Lungs: clear to  auscultation bilaterally Heart: S1, S2 normal Extremities: extremities normal, atraumatic, no cyanosis or edema    Assessment:    Acute Bronchitis    Plan:    Antibiotics per medication orders. Avoid exposure to tobacco smoke and fumes. B-agonist inhaler. Call if shortness of breath worsens, blood in sputum, change in character of cough, development of fever or chills, inability to maintain nutrition and hydration. Avoid exposure to tobacco smoke and fumes. Chest x-ray. steroid taper

## 2014-05-14 NOTE — Progress Notes (Signed)
Pre visit review using our clinic review tool, if applicable. No additional management support is needed unless otherwise documented below in the visit note. 

## 2014-05-14 NOTE — Telephone Encounter (Signed)
Patient was seen today.,     KP

## 2014-05-22 ENCOUNTER — Other Ambulatory Visit: Payer: Self-pay | Admitting: Family Medicine

## 2014-05-28 ENCOUNTER — Other Ambulatory Visit: Payer: Self-pay | Admitting: Family Medicine

## 2014-05-28 NOTE — Telephone Encounter (Signed)
Last seen 05/14/14 and filled 10/31/13 #30 with 3 refills. Please advise    KP

## 2014-06-19 ENCOUNTER — Ambulatory Visit (INDEPENDENT_AMBULATORY_CARE_PROVIDER_SITE_OTHER): Payer: Medicare Other

## 2014-06-19 DIAGNOSIS — Z23 Encounter for immunization: Secondary | ICD-10-CM

## 2014-06-25 ENCOUNTER — Other Ambulatory Visit: Payer: Self-pay | Admitting: Family Medicine

## 2014-06-25 NOTE — Telephone Encounter (Signed)
Rx given 05/14/14 for Bronchitis. Patient requesting refill. Please advise     KP

## 2014-07-19 ENCOUNTER — Telehealth: Payer: Self-pay | Admitting: Cardiology

## 2014-07-19 NOTE — Telephone Encounter (Signed)
  Patient has known systolic CHF with EF of 35%. She is on 40 mg of Lasix BID and followed by Dr. Clifton James.  Her daughter calls reporting bilateral LEE and mild resting dyspnea. No chest pain. She reports full medication compliance but non adherence to daily weights and admits to dietary indiscretion over the last week.   I have reviewed prior labs. Last BMET revealed normal renal function with SCr of 0.8. I have instructed patient's daughter to increase Lasix to 80 mg in the am and 40 mg at night x 2 days. Was also instructed to increase supplemental K and monitor BP response. Her daughter is an ICU RN and verbalized understanding. She was instructed to notify our office on Monday 07/20/14 if not improved so that further recommendations and possible office appt can be made if needed. The patient's daughter is in agreement with this plan.  Robbie Lis, PA-C 07/19/2014

## 2014-07-21 ENCOUNTER — Other Ambulatory Visit: Payer: Self-pay | Admitting: Cardiovascular Disease

## 2014-07-21 ENCOUNTER — Other Ambulatory Visit: Payer: Self-pay | Admitting: Physician Assistant

## 2014-08-10 ENCOUNTER — Telehealth: Payer: Self-pay | Admitting: Family Medicine

## 2014-08-10 ENCOUNTER — Telehealth: Payer: Self-pay | Admitting: Cardiovascular Disease

## 2014-08-10 DIAGNOSIS — I5022 Chronic systolic (congestive) heart failure: Secondary | ICD-10-CM

## 2014-08-10 NOTE — Telephone Encounter (Signed)
Left message to call back  

## 2014-08-10 NOTE — Telephone Encounter (Signed)
msg for a return call.     KP

## 2014-08-10 NOTE — Telephone Encounter (Signed)
Spoke with pt's son who reports pt is retaining fluid around waist.  Also had puffiness in hands and face yesterday.  Pt takes Lasix 40 mg by mouth twice daily. Yesterday family gave her extra 40 mg Lasix in the AM.  Family spoke with PA on July 19, 2014 and additional Lasix was given for 2 days. Pt returned to baseline after this.  Pt has been at baseline until this past weekend.  Swelling in hands and face has improved today after extra Lasix yesterday but pt still with swelling in hips/abdominal area. Pt unable to weigh daily but son notes they have had to buy her bigger sized pants.  Some shortness of breath but pt is not very active.  Son will give pt additional 40 mg Lasix this AM and I will forward to Dr. Clifton James for further recommendations.  I offered son appt this afternoon with PA but he is not sure if visit needed at this time.  He will call back if wants pt to be seen.

## 2014-08-10 NOTE — Telephone Encounter (Signed)
Please advise      KP 

## 2014-08-10 NOTE — Telephone Encounter (Signed)
Would increase Lasix to 80 mg in the am and 40 mg in the pm. cdm

## 2014-08-10 NOTE — Telephone Encounter (Signed)
New message    Son calling     Patient C/O retaining fluid in her waist . Some sob .

## 2014-08-10 NOTE — Telephone Encounter (Signed)
Caller name: Jazabella, [place] Relation to pt: son  Call back number: [phone]   Reason for call:   pt was expercicing knee pain 08/07/14 pt son gave mother 1/2 of traMADol tablet in the morning and then the other 1/2 in the afternoon. Son stated tramadol makes pt confused. Please advise

## 2014-08-10 NOTE — Telephone Encounter (Signed)
Patient son returned phone call. Best # [phone]

## 2014-08-10 NOTE — Telephone Encounter (Signed)
Tylenol arthritis would be the only option then. If ultram makes her confused the stronger meds will most likely also

## 2014-08-11 MED ORDER — FUROSEMIDE 40 MG PO TABS: ORAL_TABLET | ORAL | Status: DC

## 2014-08-11 NOTE — Telephone Encounter (Signed)
I left a detailed message on [name]'s VM.

## 2014-08-11 NOTE — Telephone Encounter (Signed)
Follow up ° ° ° ° ° °Returning Pat's call °

## 2014-08-11 NOTE — Telephone Encounter (Signed)
I would continue this dose for the next week and if her swelling resolves, we may continue at this dose long term. cdm

## 2014-08-11 NOTE — Telephone Encounter (Signed)
**Note De-Identified  Obfuscation** [name] verbalized understanding and agrees with plan. He states that he will contact Dr Gibson Ramp nurse, Dennie Bible, in 1 week to discuss how the pt is doing on current dose of Lasix.  Will forward note to [facility] as FYI.

## 2014-08-11 NOTE — Telephone Encounter (Signed)
The pts son is advised and he verbalized understanding.  He wants to know how long the pt will be taking lasix at this dose.  Please advise.

## 2014-08-12 NOTE — Telephone Encounter (Signed)
[name] has been made aware to try the Tylenol arthritis as needed and he voiced understanding. I made him aware to be careful with the amount of medication given, he agreed.     KP

## 2014-08-18 ENCOUNTER — Telehealth: Payer: Self-pay | Admitting: Cardiovascular Disease

## 2014-08-18 DIAGNOSIS — I5022 Chronic systolic (congestive) heart failure: Secondary | ICD-10-CM

## 2014-08-18 NOTE — Telephone Encounter (Signed)
New message  Pt daughter called. Requests a call back to discuss patient swelling, She reports that it has improved much but she has a trace amount in her ankles. He hips are better but its more towards 95% improvement. How much lasix should the patient intake moving forward. Please call back to discuss.

## 2014-08-18 NOTE — Telephone Encounter (Signed)
Spoke with pt's daughter and gave her instructions from Dr. Clifton James. They will bring pt in for BMP on August 20, 2014

## 2014-08-18 NOTE — Telephone Encounter (Signed)
Spoke with pt's daughter. She reports pt is doing better on current dose of furosemide (was increased to 80 mg in AM and 40 in PM last week per phone note 08/10/14). Now with trace edema in ankles. Slight swelling in hips still present but improved.  Will forward to Dr. Clifton James to see if pt should continue on this dose long term.

## 2014-08-18 NOTE — Telephone Encounter (Signed)
I would continue this dose long term. She will need a BMET this week if possible to make sure her electrolytes and renal function are stable. cdm

## 2014-08-20 ENCOUNTER — Other Ambulatory Visit (INDEPENDENT_AMBULATORY_CARE_PROVIDER_SITE_OTHER): Payer: Medicare Other | Admitting: *Deleted

## 2014-08-20 ENCOUNTER — Other Ambulatory Visit: Payer: Self-pay | Admitting: Cardiovascular Disease

## 2014-08-20 DIAGNOSIS — I5022 Chronic systolic (congestive) heart failure: Secondary | ICD-10-CM

## 2014-08-20 LAB — BASIC METABOLIC PANEL
BUN: 18 mg/dL (ref 6–23)
CALCIUM: 8.9 mg/dL (ref 8.4–10.5)
CHLORIDE: 96 meq/L (ref 96–112)
CO2: 28 mEq/L (ref 19–32)
CREATININE: 0.9 mg/dL (ref 0.4–1.2)
GFR: 66.7 mL/min (ref >60.00)
Glucose, Bld: 144 mg/dL — ABNORMAL HIGH (ref 70–99)
Potassium: 4 mEq/L (ref 3.5–5.1)
Sodium: 132 mEq/L — ABNORMAL LOW (ref 135–145)

## 2014-08-25 ENCOUNTER — Ambulatory Visit (INDEPENDENT_AMBULATORY_CARE_PROVIDER_SITE_OTHER): Payer: Medicare Other | Admitting: Family Medicine

## 2014-08-25 ENCOUNTER — Encounter: Payer: Self-pay | Admitting: Family Medicine

## 2014-08-25 VITALS — BP 124/74 | HR 68 | Temp 98.0°F | Ht 64.0 in | Wt 134.0 lb

## 2014-08-25 DIAGNOSIS — M170 Bilateral primary osteoarthritis of knee: Secondary | ICD-10-CM

## 2014-08-25 DIAGNOSIS — Z23 Encounter for immunization: Secondary | ICD-10-CM

## 2014-08-25 MED ORDER — DICLOFENAC SODIUM 1 % TD GEL: 2.0000 g | Freq: Four times a day (QID) | TRANSDERMAL | Status: DC

## 2014-08-25 NOTE — Progress Notes (Signed)
Pre visit review using our clinic review tool, if applicable. No additional management support is needed unless otherwise documented below in the visit note. 

## 2014-08-25 NOTE — Progress Notes (Signed)
Subjective:    [name] is a 80 y.o. female who presents for Medicare Annual/Subsequent preventive examination.  Preventive Screening-Counseling & Management  Tobacco History  Smoking status  . Never Smoker   Smokeless tobacco  . Never Used     Problems Prior to Visit 1.   Current Problems (verified) Patient Active Problem List   Diagnosis Date Noted  . Hypotension, unspecified 05/29/2013  . Malnutrition 05/27/2013  . Coronary atherosclerosis of native coronary artery 05/23/2013  . Non-ischemic cardiomyopathy 05/23/2013  . Atrial fibrillation with RVR 05/20/2013  . Acute on chronic combined systolic and diastolic CHF, NYHA class 2 05/20/2013  . Dementia with behavioral disturbance 05/20/2013  . Leukocytosis, unspecified 05/20/2013  . Pleural effusion 05/20/2013  . Hyponatremia 05/20/2013  . Onychomycosis 04/09/2013  . Memory loss 08/01/2012  . Pre-ulcerative corn or callous 06/27/2012  . DOE (dyspnea on exertion) 05/12/2011  . HEMOCCULT POSITIVE STOOL 03/22/2010  . SENILE OSTEOPOROSIS 02/18/2010  . OVERACTIVE BLADDER 06/28/2009  . DERMATITIS 01/21/2008  . EDEMA, ANKLES 09/05/2007  . HYPERTENSION 03/01/2007  . OSTEOARTHRITIS 03/01/2007  . OSTEOPOROSIS 03/01/2007    Medications Prior to Visit Current Outpatient Prescriptions on File Prior to Visit  Medication Sig Dispense Refill  . acetaminophen (TYLENOL) 500 MG tablet Take 250 mg by mouth every 6 (six) hours as needed for moderate pain.    Marland Kitchen albuterol (PROAIR HFA) 108 (90 BASE) MCG/ACT inhaler Inhale 2 puffs into the lungs every 6 (six) hours as needed for wheezing or shortness of breath. 1 Inhaler 5  . aspirin EC 81 MG EC tablet Take 1 tablet (81 mg total) by mouth daily. 30 tablet 0  . carvedilol (COREG) 3.125 MG tablet TAKE 1 TABLET BY MOUTH TWICE DAILY WITH MEALS 60 tablet 0  . DIGOX 125 MCG tablet TAKE 1 TABLET BY MOUTH DAILY 30 tablet 0  . donepezil (ARICEPT) 5 MG tablet Take 5 mg by mouth at bedtime.     . furosemide (LASIX) 40 MG tablet Take 80 mg (2 tablets) in the am and 40 mg (1 tablet) in the afternoon 90 tablet 3  . Menthol-Methyl Salicylate (MUSCLE RUB EX) Apply 1 application topically 3 (three) times daily as needed (for pain).    Marland Kitchen oxybutynin (DITROPAN-XL) 5 MG 24 hr tablet TAKE 1 TABLET BY MOUTH EVERY DAY 30 tablet 11  . Petrolatum (ALOE VESTA SKIN PROTECTANT EX) Apply 1 application topically 3 (three) times daily.    . polyethylene glycol powder (MIRALAX) powder Take 17 g by mouth daily.     . QUEtiapine (SEROQUEL) 100 MG tablet TAKE 1 TABLET BY MOUTH AT BEDTIME 30 tablet 11   No current facility-administered medications on file prior to visit.    Current Medications (verified) Current Outpatient Prescriptions  Medication Sig Dispense Refill  . acetaminophen (TYLENOL) 500 MG tablet Take 250 mg by mouth every 6 (six) hours as needed for moderate pain.    Marland Kitchen albuterol (PROAIR HFA) 108 (90 BASE) MCG/ACT inhaler Inhale 2 puffs into the lungs every 6 (six) hours as needed for wheezing or shortness of breath. 1 Inhaler 5  . aspirin EC 81 MG EC tablet Take 1 tablet (81 mg total) by mouth daily. 30 tablet 0  . carvedilol (COREG) 3.125 MG tablet TAKE 1 TABLET BY MOUTH TWICE DAILY WITH MEALS 60 tablet 0  . DIGOX 125 MCG tablet TAKE 1 TABLET BY MOUTH DAILY 30 tablet 0  . donepezil (ARICEPT) 5 MG tablet Take 5 mg by mouth at bedtime.    Marland Kitchen  furosemide (LASIX) 40 MG tablet Take 80 mg (2 tablets) in the am and 40 mg (1 tablet) in the afternoon 90 tablet 3  . Menthol-Methyl Salicylate (MUSCLE RUB EX) Apply 1 application topically 3 (three) times daily as needed (for pain).    Marland Kitchen. oxybutynin (DITROPAN-XL) 5 MG 24 hr tablet TAKE 1 TABLET BY MOUTH EVERY DAY 30 tablet 11  . Petrolatum (ALOE VESTA SKIN PROTECTANT EX) Apply 1 application topically 3 (three) times daily.    . polyethylene glycol powder (MIRALAX) powder Take 17 g by mouth daily.     . QUEtiapine (SEROQUEL) 100 MG tablet TAKE 1 TABLET BY  MOUTH AT BEDTIME 30 tablet 11  . diclofenac sodium (VOLTAREN) 1 % GEL Apply 2 g topically 4 (four) times daily. 100 g 3   No current facility-administered medications for this visit.     Allergies (verified) Review of patient's allergies indicates no known allergies.   PAST HISTORY  Family History Family History  Problem Relation Age of Onset  . Depression    . Heart attack    . Coronary artery disease    . Coronary artery disease Mother 6769  . Heart attack Son 1851    Social History History  Substance Use Topics  . Smoking status: Never Smoker   . Smokeless tobacco: Never Used  . Alcohol Use: No     Are there smokers in your home (other than you)? No  Risk Factors Current exercise habits: The patient does not participate in regular exercise at present.  Dietary issues discussed: none   Cardiac risk factors: advanced age (older than 3655 for men, 6165 for women), dyslipidemia, hypertension and sedentary lifestyle.  Depression Screen (Note: if answer to either of the following is "Yes", a more complete depression screening is indicated)   Over the past two weeks, have you felt down, depressed or hopeless? No  Over the past two weeks, have you felt little interest or pleasure in doing things? No  Have you lost interest or pleasure in daily life? No  Do you often feel hopeless? No  Do you cry easily over simple problems? No  Activities of Daily Living In your present state of health, do you have any difficulty performing the following activities?:  Driving? Yes Managing money?  Yes Feeding yourself? No Getting from bed to chair? Climbing a flight of stairs? Yes Preparing food and eating?: Yes Bathing or showering? Yes Getting dressed: Yes Getting to the toilet? Yes Using the toilet:Yes Moving around from place to place: Yes In the past year have you fallen or had a near fall?:No   Are you sexually active?  No  Do you have more than one partner?  No  Hearing  Difficulties: Yes Do you often ask people to speak up or repeat themselves? Yes Do you experience ringing or noises in your ears? No Do you have difficulty understanding soft or whispered voices? Yes   Do you feel that you have a problem with memory? Yes  Do you often misplace items? Yes  Do you feel safe at home?  Yes  Cognitive Testing  Alert? No  Normal Appearance?Yes  Oriented to person? No  Place? No   Time? No  Recall of three objects?  No  Can perform simple calculations? No  Displays appropriate judgment?No  Can read the correct time from a watch face?No   Advanced Directives have been discussed with the patient? Yes  List the Names of Other Physician/Practitioners you currently use: 1.  [facility]-- card Indicate any recent Medical Services you may have received from other than [facility] providers in the past year (date may be approximate).  Immunization History  Administered Date(s) Administered  . Influenza Split 06/29/2011, 08/01/2012  . Influenza Whole 08/02/2005, 08/06/2007, 07/15/2008, 06/28/2009, 06/27/2010  . Influenza,inj,Quad PF,36+ Mos 07/17/2013, 06/19/2014  . Pneumococcal Conjugate-13 08/25/2014  . Pneumococcal Polysaccharide-23 11/08/2006, 07/28/2013  . Td 10/02/2005  . Zoster 12/12/2006    Screening Tests Health Maintenance  Topic Date Due  . INFLUENZA VACCINE  05/03/2015  . TETANUS/TDAP  10/03/2015  . COLONOSCOPY  05/09/2020  . PNEUMOCOCCAL POLYSACCHARIDE VACCINE AGE 62 AND OVER  Completed  . ZOSTAVAX  Completed    All answers were reviewed with the patient and necessary referrals were made:  [name], DO   08/25/2014   History reviewed:  She  has a past medical history of Hypertension; Osteoporosis; Overactive bladder; Chronic systolic heart failure (05/20/2013); Atrial fibrillation; Dementia; Arthritis; CAD (coronary artery disease); Hyponatremia; and NICM (nonischemic cardiomyopathy). She  does not have any pertinent problems on file. She   has past surgical history that includes Abdominal hysterectomy and Spine surgery (1980). Her family history includes Coronary artery disease in an other family member; Coronary artery disease (age of onset: 6) in her mother; Depression in an other family member; Heart attack in an other family member; Heart attack (age of onset: 75) in her son. She  reports that she has never smoked. She has never used smokeless tobacco. She reports that she does not drink alcohol or use illicit drugs. She has a current medication list which includes the following prescription(s): acetaminophen, albuterol, aspirin, carvedilol, digox, donepezil, furosemide, menthol-methyl salicylate, oxybutynin, petrolatum, polyethylene glycol powder, quetiapine, and diclofenac sodium. Current Outpatient Prescriptions on File Prior to Visit  Medication Sig Dispense Refill  . acetaminophen (TYLENOL) 500 MG tablet Take 250 mg by mouth every 6 (six) hours as needed for moderate pain.    Marland Kitchen albuterol (PROAIR HFA) 108 (90 BASE) MCG/ACT inhaler Inhale 2 puffs into the lungs every 6 (six) hours as needed for wheezing or shortness of breath. 1 Inhaler 5  . aspirin EC 81 MG EC tablet Take 1 tablet (81 mg total) by mouth daily. 30 tablet 0  . carvedilol (COREG) 3.125 MG tablet TAKE 1 TABLET BY MOUTH TWICE DAILY WITH MEALS 60 tablet 0  . DIGOX 125 MCG tablet TAKE 1 TABLET BY MOUTH DAILY 30 tablet 0  . donepezil (ARICEPT) 5 MG tablet Take 5 mg by mouth at bedtime.    . furosemide (LASIX) 40 MG tablet Take 80 mg (2 tablets) in the am and 40 mg (1 tablet) in the afternoon 90 tablet 3  . Menthol-Methyl Salicylate (MUSCLE RUB EX) Apply 1 application topically 3 (three) times daily as needed (for pain).    Marland Kitchen oxybutynin (DITROPAN-XL) 5 MG 24 hr tablet TAKE 1 TABLET BY MOUTH EVERY DAY 30 tablet 11  . Petrolatum (ALOE VESTA SKIN PROTECTANT EX) Apply 1 application topically 3 (three) times daily.    . polyethylene glycol powder (MIRALAX) powder Take 17 g  by mouth daily.     . QUEtiapine (SEROQUEL) 100 MG tablet TAKE 1 TABLET BY MOUTH AT BEDTIME 30 tablet 11   No current facility-administered medications on file prior to visit.   She has No Known Allergies.  Review of Systems .medicaree   Objective:     Vision by Snellen chart: right HLK:[id number] to do Body mass index is 22.99 kg/(m^2). BP 124/74 mmHg  Pulse  68  Temp(Src) 98 F (36.7 C) (Oral)  Ht 5\' 4"  (1.626 m)  Wt 134 lb (60.782 kg)  BMI 22.99 kg/m2  SpO2 97%  BP 124/74 mmHg  Pulse 68  Temp(Src) 98 F (36.7 C) (Oral)  Ht 5\' 4"  (1.626 m)  Wt 134 lb (60.782 kg)  BMI 22.99 kg/m2  SpO2 97% General appearance: alert, cooperative, appears stated age and no distress Head: Normocephalic, without obvious abnormality, atraumatic Eyes: negative findings: lids and lashes normal and pupils equal, round, reactive to light and accomodation Ears: normal TM's and external ear canals both ears Nose: Nares normal. Septum midline. Mucosa normal. No drainage or sinus tenderness. Throat: lips, mucosa, and tongue normal; teeth and gums normal Neck: no adenopathy, no JVD and thyroid not enlarged, symmetric, no tenderness/mass/nodules Back: negative Lungs: clear to auscultation bilaterally Breasts: normal appearance, no masses or tenderness Heart: regular rate and rhythm, S1, S2 normal, no murmur, click, rub or gallop Abdomen: soft, non-tender; bowel sounds normal; no masses,  no organomegaly Pelvic: not indicated; post-menopausal, no abnormal Pap smears in past Extremities: extremities normal, atraumatic, no cyanosis or edema Pulses: 2+ and symmetric Skin: Skin color, texture, turgor normal. No rashes or lesions Lymph nodes: Cervical, supraclavicular, and axillary nodes normal. Neurologic: -- pleasantly demented-- she knew  I was the dr but could not remember my name-- she used to know it.  She does recognize her daughter      Assessment:     cpe      Plan:     During the course  of the visit the patient was educated and counseled about appropriate screening and preventive services including:    Pneumococcal vaccine   Influenza vaccine  Advanced directives: has an advanced directive - a copy HAS NOT been provided.  Diet review for nutrition referral? Yes ____  Not Indicated ____   Patient Instructions (the written plan) was given to the patient.  Medicare Attestation I have personally reviewed: The patient's medical and social history Their use of alcohol, tobacco or illicit drugs Their current medications and supplements The patient's functional ability including ADLs,fall risks, home safety risks, cognitive, and hearing and visual impairment Diet and physical activities Evidence for depression or mood disorders  The patient's weight, height, BMI, and visual acuity have been recorded in the chart.  I have made referrals, counseling, and provided education to the patient based on review of the above and I have provided the patient with a written personalized care plan for preventive services.   The pt daugher prefers to not give lipitor any more--no mammograms or bmd 1. Need for pneumococcal vaccine  - Pneumococcal conjugate vaccine 13-valent  2. Primary osteoarthritis of both knees  - diclofenac sodium (VOLTAREN) 1 % GEL; Apply 2 g topically 4 (four) times daily.  Dispense: 100 g; Refill: 3   [name], DO .  08/25/2014

## 2014-08-25 NOTE — Patient Instructions (Signed)
Preventive Care for Adults A healthy lifestyle and preventive care can promote health and wellness. Preventive health guidelines for women include the following key practices.  A routine yearly physical is a good way to check with your health care provider about your health and preventive screening. It is a chance to share any concerns and updates on your health and to receive a thorough exam.  Visit your dentist for a routine exam and preventive care every 6 months. Brush your teeth twice a day and floss once a day. Good oral hygiene prevents tooth decay and gum disease.  The frequency of eye exams is based on your age, health, family medical history, use of contact lenses, and other factors. Follow your health care provider's recommendations for frequency of eye exams.  Eat a healthy diet. Foods like vegetables, fruits, whole grains, low-fat dairy products, and lean protein foods contain the nutrients you need without too many calories. Decrease your intake of foods high in solid fats, added sugars, and salt. Eat the right amount of calories for you.Get information about a proper diet from your health care provider, if necessary.  Regular physical exercise is one of the most important things you can do for your health. Most adults should get at least 150 minutes of moderate-intensity exercise (any activity that increases your heart rate and causes you to sweat) each week. In addition, most adults need muscle-strengthening exercises on 2 or more days a week.  Maintain a healthy weight. The body mass index (BMI) is a screening tool to identify possible weight problems. It provides an estimate of body fat based on height and weight. Your health care provider can find your BMI and can help you achieve or maintain a healthy weight.For adults 20 years and older:  A BMI below 18.5 is considered underweight.  A BMI of 18.5 to 24.9 is normal.  A BMI of 25 to 29.9 is considered overweight.  A BMI of  30 and above is considered obese.  Maintain normal blood lipids and cholesterol levels by exercising and minimizing your intake of saturated fat. Eat a balanced diet with plenty of fruit and vegetables. Blood tests for lipids and cholesterol should begin at age 76 and be repeated every 5 years. If your lipid or cholesterol levels are high, you are over 50, or you are at high risk for heart disease, you may need your cholesterol levels checked more frequently.Ongoing high lipid and cholesterol levels should be treated with medicines if diet and exercise are not working.  If you smoke, find out from your health care provider how to quit. If you do not use tobacco, do not start.  Lung cancer screening is recommended for adults aged 22-80 years who are at high risk for developing lung cancer because of a history of smoking. A yearly low-dose CT scan of the lungs is recommended for people who have at least a 30-pack-year history of smoking and are a current smoker or have quit within the past 15 years. A pack year of smoking is smoking an average of 1 pack of cigarettes a day for 1 year (for example: 1 pack a day for 30 years or 2 packs a day for 15 years). Yearly screening should continue until the smoker has stopped smoking for at least 15 years. Yearly screening should be stopped for people who develop a health problem that would prevent them from having lung cancer treatment.  If you are pregnant, do not drink alcohol. If you are breastfeeding,  be very cautious about drinking alcohol. If you are not pregnant and choose to drink alcohol, do not have more than 1 drink per day. One drink is considered to be 12 ounces (355 mL) of beer, 5 ounces (148 mL) of wine, or 1.5 ounces (44 mL) of liquor.  Avoid use of street drugs. Do not share needles with anyone. Ask for help if you need support or instructions about stopping the use of drugs.  High blood pressure causes heart disease and increases the risk of  stroke. Your blood pressure should be checked at least every 1 to 2 years. Ongoing high blood pressure should be treated with medicines if weight loss and exercise do not work.  If you are 3-86 years old, ask your health care provider if you should take aspirin to prevent strokes.  Diabetes screening involves taking a blood sample to check your fasting blood sugar level. This should be done once every 3 years, after age 67, if you are within normal weight and without risk factors for diabetes. Testing should be considered at a younger age or be carried out more frequently if you are overweight and have at least 1 risk factor for diabetes.  Breast cancer screening is essential preventive care for women. You should practice "breast self-awareness." This means understanding the normal appearance and feel of your breasts and may include breast self-examination. Any changes detected, no matter how small, should be reported to a health care provider. Women in their 8s and 30s should have a clinical breast exam (CBE) by a health care provider as part of a regular health exam every 1 to 3 years. After age 70, women should have a CBE every year. Starting at age 25, women should consider having a mammogram (breast X-ray test) every year. Women who have a family history of breast cancer should talk to their health care provider about genetic screening. Women at a high risk of breast cancer should talk to their health care providers about having an MRI and a mammogram every year.  Breast cancer gene (BRCA)-related cancer risk assessment is recommended for women who have family members with BRCA-related cancers. BRCA-related cancers include breast, ovarian, tubal, and peritoneal cancers. Having family members with these cancers may be associated with an increased risk for harmful changes (mutations) in the breast cancer genes BRCA1 and BRCA2. Results of the assessment will determine the need for genetic counseling and  BRCA1 and BRCA2 testing.  Routine pelvic exams to screen for cancer are no longer recommended for nonpregnant women who are considered low risk for cancer of the pelvic organs (ovaries, uterus, and vagina) and who do not have symptoms. Ask your health care provider if a screening pelvic exam is right for you.  If you have had past treatment for cervical cancer or a condition that could lead to cancer, you need Pap tests and screening for cancer for at least 20 years after your treatment. If Pap tests have been discontinued, your risk factors (such as having a new sexual partner) need to be reassessed to determine if screening should be resumed. Some women have medical problems that increase the chance of getting cervical cancer. In these cases, your health care provider may recommend more frequent screening and Pap tests.  The HPV test is an additional test that may be used for cervical cancer screening. The HPV test looks for the virus that can cause the cell changes on the cervix. The cells collected during the Pap test can be  tested for HPV. The HPV test could be used to screen women aged 30 years and older, and should be used in women of any age who have unclear Pap test results. After the age of 30, women should have HPV testing at the same frequency as a Pap test.  Colorectal cancer can be detected and often prevented. Most routine colorectal cancer screening begins at the age of 50 years and continues through age 75 years. However, your health care provider may recommend screening at an earlier age if you have risk factors for colon cancer. On a yearly basis, your health care provider may provide home test kits to check for hidden blood in the stool. Use of a small camera at the end of a tube, to directly examine the colon (sigmoidoscopy or colonoscopy), can detect the earliest forms of colorectal cancer. Talk to your health care provider about this at age 50, when routine screening begins. Direct  exam of the colon should be repeated every 5-10 years through age 75 years, unless early forms of pre-cancerous polyps or small growths are found.  People who are at an increased risk for hepatitis B should be screened for this virus. You are considered at high risk for hepatitis B if:  You were born in a country where hepatitis B occurs often. Talk with your health care provider about which countries are considered high risk.  Your parents were born in a high-risk country and you have not received a shot to protect against hepatitis B (hepatitis B vaccine).  You have HIV or AIDS.  You use needles to inject street drugs.  You live with, or have sex with, someone who has hepatitis B.  You get hemodialysis treatment.  You take certain medicines for conditions like cancer, organ transplantation, and autoimmune conditions.  Hepatitis C blood testing is recommended for all people born from 1945 through 1965 and any individual with known risks for hepatitis C.  Practice safe sex. Use condoms and avoid high-risk sexual practices to reduce the spread of sexually transmitted infections (STIs). STIs include gonorrhea, chlamydia, syphilis, trichomonas, herpes, HPV, and human immunodeficiency virus (HIV). Herpes, HIV, and HPV are viral illnesses that have no cure. They can result in disability, cancer, and death.  You should be screened for sexually transmitted illnesses (STIs) including gonorrhea and chlamydia if:  You are sexually active and are younger than 24 years.  You are older than 24 years and your health care provider tells you that you are at risk for this type of infection.  Your sexual activity has changed since you were last screened and you are at an increased risk for chlamydia or gonorrhea. Ask your health care provider if you are at risk.  If you are at risk of being infected with HIV, it is recommended that you take a prescription medicine daily to prevent HIV infection. This is  called preexposure prophylaxis (PrEP). You are considered at risk if:  You are a heterosexual woman, are sexually active, and are at increased risk for HIV infection.  You take drugs by injection.  You are sexually active with a partner who has HIV.  Talk with your health care provider about whether you are at high risk of being infected with HIV. If you choose to begin PrEP, you should first be tested for HIV. You should then be tested every 3 months for as long as you are taking PrEP.  Osteoporosis is a disease in which the bones lose minerals and strength   with aging. This can result in serious bone fractures or breaks. The risk of osteoporosis can be identified using a bone density scan. Women ages 65 years and over and women at risk for fractures or osteoporosis should discuss screening with their health care providers. Ask your health care provider whether you should take a calcium supplement or vitamin D to reduce the rate of osteoporosis.  Menopause can be associated with physical symptoms and risks. Hormone replacement therapy is available to decrease symptoms and risks. You should talk to your health care provider about whether hormone replacement therapy is right for you.  Use sunscreen. Apply sunscreen liberally and repeatedly throughout the day. You should seek shade when your shadow is shorter than you. Protect yourself by wearing long sleeves, pants, a wide-brimmed hat, and sunglasses year round, whenever you are outdoors.  Once a month, do a whole body skin exam, using a mirror to look at the skin on your back. Tell your health care provider of new moles, moles that have irregular borders, moles that are larger than a pencil eraser, or moles that have changed in shape or color.  Stay current with required vaccines (immunizations).  Influenza vaccine. All adults should be immunized every year.  Tetanus, diphtheria, and acellular pertussis (Td, Tdap) vaccine. Pregnant women should  receive 1 dose of Tdap vaccine during each pregnancy. The dose should be obtained regardless of the length of time since the last dose. Immunization is preferred during the 27th-36th week of gestation. An adult who has not previously received Tdap or who does not know her vaccine status should receive 1 dose of Tdap. This initial dose should be followed by tetanus and diphtheria toxoids (Td) booster doses every 10 years. Adults with an unknown or incomplete history of completing a 3-dose immunization series with Td-containing vaccines should begin or complete a primary immunization series including a Tdap dose. Adults should receive a Td booster every 10 years.  Varicella vaccine. An adult without evidence of immunity to varicella should receive 2 doses or a second dose if she has previously received 1 dose. Pregnant females who do not have evidence of immunity should receive the first dose after pregnancy. This first dose should be obtained before leaving the health care facility. The second dose should be obtained 4-8 weeks after the first dose.  Human papillomavirus (HPV) vaccine. Females aged 13-26 years who have not received the vaccine previously should obtain the 3-dose series. The vaccine is not recommended for use in pregnant females. However, pregnancy testing is not needed before receiving a dose. If a female is found to be pregnant after receiving a dose, no treatment is needed. In that case, the remaining doses should be delayed until after the pregnancy. Immunization is recommended for any person with an immunocompromised condition through the age of 26 years if she did not get any or all doses earlier. During the 3-dose series, the second dose should be obtained 4-8 weeks after the first dose. The third dose should be obtained 24 weeks after the first dose and 16 weeks after the second dose.  Zoster vaccine. One dose is recommended for adults aged 60 years or older unless certain conditions are  present.  Measles, mumps, and rubella (MMR) vaccine. Adults born before 1957 generally are considered immune to measles and mumps. Adults born in 1957 or later should have 1 or more doses of MMR vaccine unless there is a contraindication to the vaccine or there is laboratory evidence of immunity to   each of the three diseases. A routine second dose of MMR vaccine should be obtained at least 28 days after the first dose for students attending postsecondary schools, health care workers, or international travelers. People who received inactivated measles vaccine or an unknown type of measles vaccine during 1963-1967 should receive 2 doses of MMR vaccine. People who received inactivated mumps vaccine or an unknown type of mumps vaccine before 1979 and are at high risk for mumps infection should consider immunization with 2 doses of MMR vaccine. For females of childbearing age, rubella immunity should be determined. If there is no evidence of immunity, females who are not pregnant should be vaccinated. If there is no evidence of immunity, females who are pregnant should delay immunization until after pregnancy. Unvaccinated health care workers born before 1957 who lack laboratory evidence of measles, mumps, or rubella immunity or laboratory confirmation of disease should consider measles and mumps immunization with 2 doses of MMR vaccine or rubella immunization with 1 dose of MMR vaccine.  Pneumococcal 13-valent conjugate (PCV13) vaccine. When indicated, a person who is uncertain of her immunization history and has no record of immunization should receive the PCV13 vaccine. An adult aged 19 years or older who has certain medical conditions and has not been previously immunized should receive 1 dose of PCV13 vaccine. This PCV13 should be followed with a dose of pneumococcal polysaccharide (PPSV23) vaccine. The PPSV23 vaccine dose should be obtained at least 8 weeks after the dose of PCV13 vaccine. An adult aged 19  years or older who has certain medical conditions and previously received 1 or more doses of PPSV23 vaccine should receive 1 dose of PCV13. The PCV13 vaccine dose should be obtained 1 or more years after the last PPSV23 vaccine dose.  Pneumococcal polysaccharide (PPSV23) vaccine. When PCV13 is also indicated, PCV13 should be obtained first. All adults aged 65 years and older should be immunized. An adult younger than age 65 years who has certain medical conditions should be immunized. Any person who resides in a nursing home or long-term care facility should be immunized. An adult smoker should be immunized. People with an immunocompromised condition and certain other conditions should receive both PCV13 and PPSV23 vaccines. People with human immunodeficiency virus (HIV) infection should be immunized as soon as possible after diagnosis. Immunization during chemotherapy or radiation therapy should be avoided. Routine use of PPSV23 vaccine is not recommended for American Indians, [place] Natives, or people younger than 65 years unless there are medical conditions that require PPSV23 vaccine. When indicated, people who have unknown immunization and have no record of immunization should receive PPSV23 vaccine. One-time revaccination 5 years after the first dose of PPSV23 is recommended for people aged 19-64 years who have chronic kidney failure, nephrotic syndrome, asplenia, or immunocompromised conditions. People who received 1-2 doses of PPSV23 before age 65 years should receive another dose of PPSV23 vaccine at age 65 years or later if at least 5 years have passed since the previous dose. Doses of PPSV23 are not needed for people immunized with PPSV23 at or after age 65 years.  Meningococcal vaccine. Adults with asplenia or persistent complement component deficiencies should receive 2 doses of quadrivalent meningococcal conjugate (MenACWY-D) vaccine. The doses should be obtained at least 2 months apart.  Microbiologists working with certain meningococcal bacteria, military recruits, people at risk during an outbreak, and people who travel to or live in countries with a high rate of meningitis should be immunized. A first-year college student up through age   21 years who is living in a residence hall should receive a dose if she did not receive a dose on or after her 16th birthday. Adults who have certain high-risk conditions should receive one or more doses of vaccine.  Hepatitis A vaccine. Adults who wish to be protected from this disease, have certain high-risk conditions, work with hepatitis A-infected animals, work in hepatitis A research labs, or travel to or work in countries with a high rate of hepatitis A should be immunized. Adults who were previously unvaccinated and who anticipate close contact with an international adoptee during the first 60 days after arrival in the [place] from a country with a high rate of hepatitis A should be immunized.  Hepatitis B vaccine. Adults who wish to be protected from this disease, have certain high-risk conditions, may be exposed to blood or other infectious body fluids, are household contacts or sex partners of hepatitis B positive people, are clients or workers in certain care facilities, or travel to or work in countries with a high rate of hepatitis B should be immunized.  Haemophilus influenzae type b (Hib) vaccine. A previously unvaccinated person with asplenia or sickle cell disease or having a scheduled splenectomy should receive 1 dose of Hib vaccine. Regardless of previous immunization, a recipient of a hematopoietic stem cell transplant should receive a 3-dose series 6-12 months after her successful transplant. Hib vaccine is not recommended for adults with HIV infection. Preventive Services / Frequency Ages 64 to 68 years  Blood pressure check.** / Every 1 to 2 years.  Lipid and cholesterol check.** / Every 5 years beginning at age  22.  Clinical breast exam.** / Every 3 years for women in their 88s and 53s.  BRCA-related cancer risk assessment.** / For women who have family members with a BRCA-related cancer (breast, ovarian, tubal, or peritoneal cancers).  Pap test.** / Every 2 years from ages 90 through 51. Every 3 years starting at age 21 through age 56 or 3 with a history of 3 consecutive normal Pap tests.  HPV screening.** / Every 3 years from ages 24 through ages 1 to 46 with a history of 3 consecutive normal Pap tests.  Hepatitis C blood test.** / For any individual with known risks for hepatitis C.  Skin self-exam. / Monthly.  Influenza vaccine. / Every year.  Tetanus, diphtheria, and acellular pertussis (Tdap, Td) vaccine.** / Consult your health care provider. Pregnant women should receive 1 dose of Tdap vaccine during each pregnancy. 1 dose of Td every 10 years.  Varicella vaccine.** / Consult your health care provider. Pregnant females who do not have evidence of immunity should receive the first dose after pregnancy.  HPV vaccine. / 3 doses over 6 months, if 72 and younger. The vaccine is not recommended for use in pregnant females. However, pregnancy testing is not needed before receiving a dose.  Measles, mumps, rubella (MMR) vaccine.** / You need at least 1 dose of MMR if you were born in 1957 or later. You may also need a 2nd dose. For females of childbearing age, rubella immunity should be determined. If there is no evidence of immunity, females who are not pregnant should be vaccinated. If there is no evidence of immunity, females who are pregnant should delay immunization until after pregnancy.  Pneumococcal 13-valent conjugate (PCV13) vaccine.** / Consult your health care provider.  Pneumococcal polysaccharide (PPSV23) vaccine.** / 1 to 2 doses if you smoke cigarettes or if you have certain conditions.  Meningococcal vaccine.** /  1 dose if you are age 19 to 21 years and a first-year college  student living in a residence hall, or have one of several medical conditions, you need to get vaccinated against meningococcal disease. You may also need additional booster doses.  Hepatitis A vaccine.** / Consult your health care provider.  Hepatitis B vaccine.** / Consult your health care provider.  Haemophilus influenzae type b (Hib) vaccine.** / Consult your health care provider. Ages 40 to 64 years  Blood pressure check.** / Every 1 to 2 years.  Lipid and cholesterol check.** / Every 5 years beginning at age 20 years.  Lung cancer screening. / Every year if you are aged 55-80 years and have a 30-pack-year history of smoking and currently smoke or have quit within the past 15 years. Yearly screening is stopped once you have quit smoking for at least 15 years or develop a health problem that would prevent you from having lung cancer treatment.  Clinical breast exam.** / Every year after age 40 years.  BRCA-related cancer risk assessment.** / For women who have family members with a BRCA-related cancer (breast, ovarian, tubal, or peritoneal cancers).  Mammogram.** / Every year beginning at age 40 years and continuing for as long as you are in good health. Consult with your health care provider.  Pap test.** / Every 3 years starting at age 30 years through age 65 or 70 years with a history of 3 consecutive normal Pap tests.  HPV screening.** / Every 3 years from ages 30 years through ages 65 to 70 years with a history of 3 consecutive normal Pap tests.  Fecal occult blood test (FOBT) of stool. / Every year beginning at age 50 years and continuing until age 75 years. You may not need to do this test if you get a colonoscopy every 10 years.  Flexible sigmoidoscopy or colonoscopy.** / Every 5 years for a flexible sigmoidoscopy or every 10 years for a colonoscopy beginning at age 50 years and continuing until age 75 years.  Hepatitis C blood test.** / For all people born from 1945 through  1965 and any individual with known risks for hepatitis C.  Skin self-exam. / Monthly.  Influenza vaccine. / Every year.  Tetanus, diphtheria, and acellular pertussis (Tdap/Td) vaccine.** / Consult your health care provider. Pregnant women should receive 1 dose of Tdap vaccine during each pregnancy. 1 dose of Td every 10 years.  Varicella vaccine.** / Consult your health care provider. Pregnant females who do not have evidence of immunity should receive the first dose after pregnancy.  Zoster vaccine.** / 1 dose for adults aged 60 years or older.  Measles, mumps, rubella (MMR) vaccine.** / You need at least 1 dose of MMR if you were born in 1957 or later. You may also need a 2nd dose. For females of childbearing age, rubella immunity should be determined. If there is no evidence of immunity, females who are not pregnant should be vaccinated. If there is no evidence of immunity, females who are pregnant should delay immunization until after pregnancy.  Pneumococcal 13-valent conjugate (PCV13) vaccine.** / Consult your health care provider.  Pneumococcal polysaccharide (PPSV23) vaccine.** / 1 to 2 doses if you smoke cigarettes or if you have certain conditions.  Meningococcal vaccine.** / Consult your health care provider.  Hepatitis A vaccine.** / Consult your health care provider.  Hepatitis B vaccine.** / Consult your health care provider.  Haemophilus influenzae type b (Hib) vaccine.** / Consult your health care provider. Ages 65   years and over  Blood pressure check.** / Every 1 to 2 years.  Lipid and cholesterol check.** / Every 5 years beginning at age 22 years.  Lung cancer screening. / Every year if you are aged 73-80 years and have a 30-pack-year history of smoking and currently smoke or have quit within the past 15 years. Yearly screening is stopped once you have quit smoking for at least 15 years or develop a health problem that would prevent you from having lung cancer  treatment.  Clinical breast exam.** / Every year after age 4 years.  BRCA-related cancer risk assessment.** / For women who have family members with a BRCA-related cancer (breast, ovarian, tubal, or peritoneal cancers).  Mammogram.** / Every year beginning at age 40 years and continuing for as long as you are in good health. Consult with your health care provider.  Pap test.** / Every 3 years starting at age 9 years through age 34 or 91 years with 3 consecutive normal Pap tests. Testing can be stopped between 65 and 70 years with 3 consecutive normal Pap tests and no abnormal Pap or HPV tests in the past 10 years.  HPV screening.** / Every 3 years from ages 57 years through ages 64 or 45 years with a history of 3 consecutive normal Pap tests. Testing can be stopped between 65 and 70 years with 3 consecutive normal Pap tests and no abnormal Pap or HPV tests in the past 10 years.  Fecal occult blood test (FOBT) of stool. / Every year beginning at age 15 years and continuing until age 17 years. You may not need to do this test if you get a colonoscopy every 10 years.  Flexible sigmoidoscopy or colonoscopy.** / Every 5 years for a flexible sigmoidoscopy or every 10 years for a colonoscopy beginning at age 86 years and continuing until age 71 years.  Hepatitis C blood test.** / For all people born from [birth year] through 1963 and any individual with known risks for hepatitis C.  Osteoporosis screening.** / A one-time screening for women ages 83 years and over and women at risk for fractures or osteoporosis.  Skin self-exam. / Monthly.  Influenza vaccine. / Every year.  Tetanus, diphtheria, and acellular pertussis (Tdap/Td) vaccine.** / 1 dose of Td every 10 years.  Varicella vaccine.** / Consult your health care provider.  Zoster vaccine.** / 1 dose for adults aged 61 years or older.  Pneumococcal 13-valent conjugate (PCV13) vaccine.** / Consult your health care provider.  Pneumococcal  polysaccharide (PPSV23) vaccine.** / 1 dose for all adults aged 28 years and older.  Meningococcal vaccine.** / Consult your health care provider.  Hepatitis A vaccine.** / Consult your health care provider.  Hepatitis B vaccine.** / Consult your health care provider.  Haemophilus influenzae type b (Hib) vaccine.** / Consult your health care provider. ** Family history and personal history of risk and conditions may change your health care provider's recommendations. Document Released: 11/14/2001 Document Revised: 02/02/2014 Document Reviewed: 02/13/2011 [facility] Patient Information 2015 [place], [place]. This information is not intended to replace advice given to you by your health care provider. Make sure you discuss any questions you have with your health care provider.

## 2014-09-09 ENCOUNTER — Encounter (HOSPITAL_COMMUNITY): Payer: Self-pay | Admitting: Neurology

## 2014-09-09 ENCOUNTER — Emergency Department (HOSPITAL_COMMUNITY): Payer: Medicare Other

## 2014-09-09 ENCOUNTER — Observation Stay (HOSPITAL_COMMUNITY): Payer: Medicare Other

## 2014-09-09 ENCOUNTER — Observation Stay (HOSPITAL_COMMUNITY)
Admission: EM | Admit: 2014-09-09 | Discharge: 2014-09-10 | Disposition: A | Discharge status: Home / Self Care | Payer: Medicare Other | Attending: Internal Medicine | Admitting: Internal Medicine

## 2014-09-09 DIAGNOSIS — I2510 Atherosclerotic heart disease of native coronary artery without angina pectoris: Secondary | ICD-10-CM | POA: Diagnosis not present

## 2014-09-09 DIAGNOSIS — Z7982 Long term (current) use of aspirin: Secondary | ICD-10-CM | POA: Insufficient documentation

## 2014-09-09 DIAGNOSIS — I4821 Permanent atrial fibrillation: Secondary | ICD-10-CM | POA: Diagnosis present

## 2014-09-09 DIAGNOSIS — N3281 Overactive bladder: Secondary | ICD-10-CM | POA: Diagnosis not present

## 2014-09-09 DIAGNOSIS — J329 Chronic sinusitis, unspecified: Secondary | ICD-10-CM | POA: Insufficient documentation

## 2014-09-09 DIAGNOSIS — I10 Essential (primary) hypertension: Secondary | ICD-10-CM | POA: Diagnosis present

## 2014-09-09 DIAGNOSIS — I4891 Unspecified atrial fibrillation: Secondary | ICD-10-CM | POA: Diagnosis not present

## 2014-09-09 DIAGNOSIS — I5022 Chronic systolic (congestive) heart failure: Secondary | ICD-10-CM | POA: Diagnosis not present

## 2014-09-09 DIAGNOSIS — R410 Disorientation, unspecified: Secondary | ICD-10-CM | POA: Diagnosis present

## 2014-09-09 DIAGNOSIS — J90 Pleural effusion, not elsewhere classified: Secondary | ICD-10-CM | POA: Insufficient documentation

## 2014-09-09 DIAGNOSIS — M1990 Unspecified osteoarthritis, unspecified site: Secondary | ICD-10-CM | POA: Diagnosis not present

## 2014-09-09 DIAGNOSIS — R479 Unspecified speech disturbances: Secondary | ICD-10-CM

## 2014-09-09 DIAGNOSIS — F0391 Unspecified dementia with behavioral disturbance: Secondary | ICD-10-CM | POA: Insufficient documentation

## 2014-09-09 DIAGNOSIS — G459 Transient cerebral ischemic attack, unspecified: Secondary | ICD-10-CM | POA: Diagnosis not present

## 2014-09-09 DIAGNOSIS — I429 Cardiomyopathy, unspecified: Secondary | ICD-10-CM | POA: Insufficient documentation

## 2014-09-09 DIAGNOSIS — I428 Other cardiomyopathies: Secondary | ICD-10-CM

## 2014-09-09 DIAGNOSIS — M810 Age-related osteoporosis without current pathological fracture: Secondary | ICD-10-CM | POA: Insufficient documentation

## 2014-09-09 DIAGNOSIS — R4182 Altered mental status, unspecified: Secondary | ICD-10-CM | POA: Diagnosis present

## 2014-09-09 DIAGNOSIS — F03918 Unspecified dementia, unspecified severity, with other behavioral disturbance: Secondary | ICD-10-CM | POA: Diagnosis present

## 2014-09-09 DIAGNOSIS — I639 Cerebral infarction, unspecified: Secondary | ICD-10-CM

## 2014-09-09 DIAGNOSIS — Z8673 Personal history of transient ischemic attack (TIA), and cerebral infarction without residual deficits: Secondary | ICD-10-CM | POA: Diagnosis present

## 2014-09-09 LAB — COMPREHENSIVE METABOLIC PANEL
ALT: 21 U/L (ref 0–35)
ANION GAP: 13 (ref 5–15)
AST: 20 U/L (ref 0–37)
Albumin: 2.9 g/dL — ABNORMAL LOW (ref 3.5–5.2)
Alkaline Phosphatase: 102 U/L (ref 39–117)
BILIRUBIN TOTAL: 0.6 mg/dL (ref 0.3–1.2)
BUN: 21 mg/dL (ref 6–23)
CALCIUM: 9 mg/dL (ref 8.4–10.5)
CO2: 27 meq/L (ref 19–32)
CREATININE: 0.96 mg/dL (ref 0.50–1.10)
Chloride: 97 mEq/L (ref 96–112)
GFR, EST AFRICAN AMERICAN: 61 mL/min — AB (ref >90)
GFR, EST NON AFRICAN AMERICAN: 53 mL/min — AB (ref >90)
Glucose, Bld: 104 mg/dL — ABNORMAL HIGH (ref 70–99)
Potassium: 4.2 mEq/L (ref 3.7–5.3)
Sodium: 137 mEq/L (ref 137–147)
Total Protein: 6.4 g/dL (ref 6.0–8.3)

## 2014-09-09 LAB — CREATININE, SERUM
CREATININE: 0.91 mg/dL (ref 0.50–1.10)
GFR, EST AFRICAN AMERICAN: 65 mL/min — AB (ref >90)
GFR, EST NON AFRICAN AMERICAN: 56 mL/min — AB (ref >90)

## 2014-09-09 LAB — CBC WITH DIFFERENTIAL/PLATELET
Basophils Absolute: 0 10*3/uL (ref 0.0–0.1)
Basophils Relative: 0 % (ref 0–1)
EOS PCT: 4 % (ref 0–5)
Eosinophils Absolute: 0.5 10*3/uL (ref 0.0–0.7)
HEMATOCRIT: 39.7 % (ref 36.0–46.0)
HEMOGLOBIN: 13.3 g/dL (ref 12.0–15.0)
LYMPHS PCT: 12 % (ref 12–46)
Lymphs Abs: 1.3 10*3/uL (ref 0.7–4.0)
MCH: 30.4 pg (ref 26.0–34.0)
MCHC: 33.5 g/dL (ref 30.0–36.0)
MCV: 90.6 fL (ref 78.0–100.0)
MONO ABS: 1.3 10*3/uL — AB (ref 0.1–1.0)
MONOS PCT: 12 % (ref 3–12)
Neutro Abs: 8.4 10*3/uL — ABNORMAL HIGH (ref 1.7–7.7)
Neutrophils Relative %: 72 % (ref 43–77)
Platelets: 259 10*3/uL (ref 150–400)
RBC: 4.38 MIL/uL (ref 3.87–5.11)
RDW: 13.8 % (ref 11.5–15.5)
WBC: 11.5 10*3/uL — AB (ref 4.0–10.5)

## 2014-09-09 LAB — CBC
HCT: 36.4 % (ref 36.0–46.0)
Hemoglobin: 11.9 g/dL — ABNORMAL LOW (ref 12.0–15.0)
MCH: 29.7 pg (ref 26.0–34.0)
MCHC: 32.7 g/dL (ref 30.0–36.0)
MCV: 90.8 fL (ref 78.0–100.0)
PLATELETS: 250 10*3/uL (ref 150–400)
RBC: 4.01 MIL/uL (ref 3.87–5.11)
RDW: 13.7 % (ref 11.5–15.5)
WBC: 10.4 10*3/uL (ref 4.0–10.5)

## 2014-09-09 LAB — I-STAT CG4 LACTIC ACID, ED: LACTIC ACID, VENOUS: 1.19 mmol/L (ref 0.5–2.2)

## 2014-09-09 LAB — URINALYSIS, ROUTINE W REFLEX MICROSCOPIC
Bilirubin Urine: NEGATIVE
GLUCOSE, UA: NEGATIVE mg/dL
Hgb urine dipstick: NEGATIVE
Ketones, ur: NEGATIVE mg/dL
Leukocytes, UA: NEGATIVE
NITRITE: NEGATIVE
PH: 7.5 (ref 5.0–8.0)
Protein, ur: NEGATIVE mg/dL
SPECIFIC GRAVITY, URINE: 1.009 (ref 1.005–1.030)
Urobilinogen, UA: 0.2 mg/dL (ref 0.0–1.0)

## 2014-09-09 LAB — I-STAT TROPONIN, ED: TROPONIN I, POC: 0.04 ng/mL (ref 0.00–0.08)

## 2014-09-09 MED ORDER — ONDANSETRON HCL 4 MG/2ML IJ SOLN: 4.0000 mg | Freq: Four times a day (QID) | INTRAMUSCULAR | Status: DC | PRN

## 2014-09-09 MED ORDER — FUROSEMIDE 40 MG PO TABS
40.0000 mg | ORAL_TABLET | Freq: Every day | ORAL | Status: DC
  Administered 2014-09-09: 40 mg via ORAL
  Filled 2014-09-09: qty 1

## 2014-09-09 MED ORDER — POLYETHYLENE GLYCOL 3350 17 G PO PACK
17.0000 g | PACK | Freq: Every day | ORAL | Status: DC
  Administered 2014-09-10: 17 g via ORAL
  Filled 2014-09-09: qty 1

## 2014-09-09 MED ORDER — MORPHINE SULFATE 2 MG/ML IJ SOLN: 2.0000 mg | INTRAMUSCULAR | Status: DC | PRN

## 2014-09-09 MED ORDER — DONEPEZIL HCL 5 MG PO TABS
5.0000 mg | ORAL_TABLET | Freq: Every day | ORAL | Status: DC
  Administered 2014-09-09: 5 mg via ORAL
  Filled 2014-09-09: qty 1

## 2014-09-09 MED ORDER — ATORVASTATIN CALCIUM 10 MG PO TABS
20.0000 mg | ORAL_TABLET | Freq: Every day | ORAL | Status: DC
  Administered 2014-09-09: 20 mg via ORAL
  Filled 2014-09-09 (×2): qty 2

## 2014-09-09 MED ORDER — ACETAMINOPHEN 650 MG RE SUPP: 650.0000 mg | Freq: Four times a day (QID) | RECTAL | Status: DC | PRN

## 2014-09-09 MED ORDER — POLYETHYLENE GLYCOL 3350 17 GM/SCOOP PO POWD
17.0000 g | Freq: Every day | ORAL | Status: DC
  Filled 2014-09-09: qty 255

## 2014-09-09 MED ORDER — SODIUM CHLORIDE 0.9 % IJ SOLN
3.0000 mL | Freq: Two times a day (BID) | INTRAMUSCULAR | Status: DC
  Administered 2014-09-09 (×2): 3 mL via INTRAVENOUS

## 2014-09-09 MED ORDER — ONDANSETRON HCL 4 MG PO TABS: 4.0000 mg | ORAL_TABLET | Freq: Four times a day (QID) | ORAL | Status: DC | PRN

## 2014-09-09 MED ORDER — HEPARIN SODIUM (PORCINE) 5000 UNIT/ML IJ SOLN
5000.0000 [IU] | Freq: Three times a day (TID) | INTRAMUSCULAR | Status: DC
  Administered 2014-09-09 – 2014-09-10 (×3): 5000 [IU] via SUBCUTANEOUS
  Filled 2014-09-09 (×3): qty 1

## 2014-09-09 MED ORDER — DIGOXIN 125 MCG PO TABS
125.0000 ug | ORAL_TABLET | Freq: Every day | ORAL | Status: DC
  Administered 2014-09-10: 125 ug via ORAL
  Filled 2014-09-09: qty 1

## 2014-09-09 MED ORDER — HALOPERIDOL LACTATE 5 MG/ML IJ SOLN
5.0000 mg | INTRAMUSCULAR | Status: DC | PRN
Start: 2014-09-09 — End: 2014-09-10

## 2014-09-09 MED ORDER — ALBUTEROL SULFATE (2.5 MG/3ML) 0.083% IN NEBU: 3.0000 mL | INHALATION_SOLUTION | Freq: Four times a day (QID) | RESPIRATORY_TRACT | Status: DC | PRN

## 2014-09-09 MED ORDER — FUROSEMIDE 80 MG PO TABS
80.0000 mg | ORAL_TABLET | Freq: Every day | ORAL | Status: DC
  Administered 2014-09-10: 80 mg via ORAL
  Filled 2014-09-09: qty 1

## 2014-09-09 MED ORDER — OXYBUTYNIN CHLORIDE ER 5 MG PO TB24
5.0000 mg | ORAL_TABLET | Freq: Every day | ORAL | Status: DC
  Filled 2014-09-09: qty 1

## 2014-09-09 MED ORDER — ACETAMINOPHEN 325 MG PO TABS
650.0000 mg | ORAL_TABLET | Freq: Four times a day (QID) | ORAL | Status: DC | PRN
  Administered 2014-09-09 – 2014-09-10 (×2): 650 mg via ORAL
  Filled 2014-09-09 (×2): qty 2

## 2014-09-09 MED ORDER — ASPIRIN EC 81 MG PO TBEC
81.0000 mg | DELAYED_RELEASE_TABLET | Freq: Every day | ORAL | Status: DC
  Administered 2014-09-10: 81 mg via ORAL
  Filled 2014-09-09: qty 1

## 2014-09-09 MED ORDER — QUETIAPINE FUMARATE 50 MG PO TABS
100.0000 mg | ORAL_TABLET | Freq: Every day | ORAL | Status: DC
  Administered 2014-09-09: 100 mg via ORAL
  Filled 2014-09-09: qty 2

## 2014-09-09 MED ORDER — SODIUM CHLORIDE 0.9 % IV BOLUS (SEPSIS)
1000.0000 mL | Freq: Once | INTRAVENOUS | Status: AC
  Administered 2014-09-09: 1000 mL via INTRAVENOUS

## 2014-09-09 NOTE — H&P (Signed)
[facility] History and Physical  [name] CXF:[id number] DOB: November 05, 1927 DOA: 09/09/2014  Referring physician: Emergency Department PCP: [name], DO  Specialists:   Chief Complaint: Acute delerium, slurred speech  HPI: [name] is a 80 y.o. female  With a hx of dementia, HTN, afib not on anticoagulation, CAD who presents with acute delirium with acute slurred speech. The patient was brought to the ED where sx were resolved. The patient was noted to have an unremarkable head CT. Of note, the patient is at baseline dementia and an accurate history cannot be obtained.  Review of Systems: Cannot obtain from the pt secondary to baseline dementia  Past Medical History  Diagnosis Date  . Hypertension   . Osteoporosis   . Overactive bladder   . Chronic systolic heart failure 05/20/2013    a. Echo (8/14):  mild LVH, EF 35%, diff HK, mild MR  . Atrial fibrillation     not a coumadin candidate  . Dementia   . Arthritis   . CAD (coronary artery disease)     a. LHC (8/14):  prox to mid LAD 80, pRCA 30, pPDA 30-40, EF 50%  . Hyponatremia   . NICM (nonischemic cardiomyopathy)    Past Surgical History  Procedure Laterality Date  . Abdominal hysterectomy      TAH BSO  . Spine surgery  1980    Lumbar surgery   Social History:  reports that she has never smoked. She has never used smokeless tobacco. She reports that she does not drink alcohol or use illicit drugs.  where does patient live--home, ALF, SNF? and with whom if at home?  Can patient participate in ADLs?  No Known Allergies  Family History  Problem Relation Age of Onset  . Depression    . Heart attack    . Coronary artery disease    . Coronary artery disease Mother 2  . Heart attack Son 46    (be sure to complete)  Prior to Admission medications   Medication Sig Start Date End Date Taking? Authorizing Provider  acetaminophen (TYLENOL) 500 MG tablet Take 250 mg by mouth every 6 (six) hours as needed  for moderate pain.   Yes Historical Provider, MD  albuterol (PROAIR HFA) 108 (90 BASE) MCG/ACT inhaler Inhale 2 puffs into the lungs every 6 (six) hours as needed for wheezing or shortness of breath. 10/31/13  Yes Lelon Perla, DO  aspirin EC 81 MG EC tablet Take 1 tablet (81 mg total) by mouth daily. 05/29/13  Yes Jeralyn Bennett, MD  atorvastatin (LIPITOR) 20 MG tablet Take 20 mg by mouth daily. 08/21/14  Yes Historical Provider, MD  carvedilol (COREG) 3.125 MG tablet TAKE 1 TABLET BY MOUTH TWICE DAILY WITH MEALS 08/21/14  Yes Kathleene Hazel, MD  diclofenac sodium (VOLTAREN) 1 % GEL Apply 2 g topically 4 (four) times daily. 08/25/14  Yes Yvonne R Lowne, DO  DIGOX 125 MCG tablet TAKE 1 TABLET BY MOUTH DAILY 08/21/14  Yes Kathleene Hazel, MD  donepezil (ARICEPT) 5 MG tablet Take 5 mg by mouth at bedtime.   Yes Historical Provider, MD  furosemide (LASIX) 40 MG tablet Take 80 mg (2 tablets) in the am and 40 mg (1 tablet) in the afternoon 08/11/14  Yes Kathleene Hazel, MD  oxybutynin (DITROPAN-XL) 5 MG 24 hr tablet TAKE 1 TABLET BY MOUTH EVERY DAY   Yes Yvonne R Lowne, DO  polyethylene glycol powder (MIRALAX) powder Take 17 g by mouth  daily.    Yes Historical Provider, MD  QUEtiapine (SEROQUEL) 100 MG tablet TAKE 1 TABLET BY MOUTH AT BEDTIME 05/22/14  Yes Lelon Perla, DO   Physical Exam: Filed Vitals:   09/09/14 1201 09/09/14 1337 09/09/14 1345 09/09/14 1400  BP: 118/90 105/47 107/58 124/58  Pulse: 55 50 56 50  Temp:      TempSrc:      Resp: 22 24 20 11   SpO2: 97% 94% 97% 100%     General:  Awake, in nad  Eyes: PERRL B  ENT: membranes moist, dentition fair  Neck: trachea midline, neck supple  Cardiovascular: irregularly irregular, s1, s2  Respiratory: normal resp effort, no wheezing  Abdomen: soft,nondistended  Skin: normal skin turgor, no abnormal skin lesions seen  Musculoskeletal: perfused, no clubbing  Psychiatric: mood/affect, no auditory/visual  hallucinations  Neurologic: cn2-12 grossly intact, strength/sensation intact  Labs on Admission:  Basic Metabolic Panel:  Recent Labs Lab 09/09/14 1114  NA 137  K 4.2  CL 97  CO2 27  GLUCOSE 104*  BUN 21  CREATININE 0.96  CALCIUM 9.0   Liver Function Tests:  Recent Labs Lab 09/09/14 1114  AST 20  ALT 21  ALKPHOS 102  BILITOT 0.6  PROT 6.4  ALBUMIN 2.9*   No results for input(s): LIPASE, AMYLASE in the last 168 hours. No results for input(s): AMMONIA in the last 168 hours. CBC:  Recent Labs Lab 09/09/14 1114  WBC 11.5*  NEUTROABS 8.4*  HGB 13.3  HCT 39.7  MCV 90.6  PLT 259   Cardiac Enzymes: No results for input(s): CKTOTAL, CKMB, CKMBINDEX, TROPONINI in the last 168 hours.  BNP (last 3 results)  Recent Labs  12/02/13 1610  PROBNP 7689.0*   CBG: No results for input(s): GLUCAP in the last 168 hours.  Radiological Exams on Admission: Dg Chest 2 View  09/09/2014   CLINICAL DATA:  Confusion.  Shortness of breath.  EXAM: CHEST  2 VIEW  COMPARISON:  December 23, 2013.  FINDINGS: The heart size and mediastinal contours are within normal limits. Both lungs are clear. No pneumothorax is noted. Minimal bilateral pleural effusions are noted. Old right rib fractures are noted.  IMPRESSION: Minimal bilateral pleural effusions. No other significant abnormality seen in the chest.   Electronically Signed   By: Roque Lias M.D.   On: 09/09/2014 11:51   Ct Head Wo Contrast  09/09/2014   CLINICAL DATA:  Dementia.  Confusion  EXAM: CT HEAD WITHOUT CONTRAST  TECHNIQUE: Contiguous axial images were obtained from the base of the skull through the vertex without intravenous contrast.  COMPARISON:  01/20/2013  FINDINGS: There is diffuse cerebral atrophy. No acute intracranial abnormality. Specifically, no hemorrhage, hydrocephalus, mass lesion, acute infarction, or significant intracranial injury. No acute calvarial abnormality.  Air-fluid level in the right sphenoid sinus and  opacified posterior right ethmoid air cells. Remainder the paranasal sinuses and mastoids are clear.  IMPRESSION: No acute intracranial abnormality.  Right sinusitis, likely acute sinusitis in the right sphenoid sinus.   Electronically Signed   By: Charlett Nose M.D.   On: 09/09/2014 13:18    Assessment/Plan Active Problems:   Essential hypertension   Osteoarthritis   Senile osteoporosis   Atrial fibrillation with RVR   Dementia with behavioral disturbance   Non-ischemic cardiomyopathy   TIA (transient ischemic attack)  1. Acute delerium with slurred speech 1. Neurology consulted 2. Sx presently at baseline 3. CT head unremarkable 4. For now admit to med-tele, obs 5. Defer  further recs to Neurology for now 2. Osteoarthritis 1. Stable 2. Cont analgesics as tolerated 3. Afib 1. On ASA 2. Pt has a documented hx of heme pos stools, hgb stable currently, hold off on full dose anticoagulation for now, pending recs from Neurology 4. Dementia  1. Stable 5. Non-ischemic cardiomyopathy 1. Compensated 6. DVT prophylaxis 1. Heparin subQ  Code Status: Full Family Communication: Pt in room, family at bedside Disposition Plan: Pending  Time spent: 30min  Idali Lafever, Scheryl MartenSTEPHEN K [facility] Pager [phone]  If 7PM-7AM, please contact night-coverage [email or web address] Password [email or web address] 09/09/2014, 2:31 PM

## 2014-09-09 NOTE — ED Provider Notes (Signed)
CSN: [id number]     Arrival date & time 09/09/14  1033 History   First MD Initiated Contact with Patient 09/09/14 1035     Chief Complaint  Patient presents with  . Altered Mental Status     (Consider location/radiation/quality/duration/timing/severity/associated sxs/prior Treatment) HPI  This is an 80 year old female with a past medical history of dementia, cardiomyopathy, heart failure, A. fib and coronary artery disease who presents for episode of altered mental status. Patient was at home when her caretaker saw her eyes went back and she began having garbled speech. Patient was on the phone with her daughter (this occurred and daughter states she ran over to her house from work immediately. In total, the episode lasted approximately 5-10 minutes. It has completely resolved since that time. There have been no other reports of urinary frequency, foul odor, fevers, or other signs of infection. Past Medical History  Diagnosis Date  . Hypertension   . Osteoporosis   . Overactive bladder   . Chronic systolic heart failure 05/20/2013    a. Echo (8/14):  mild LVH, EF 35%, diff HK, mild MR  . Atrial fibrillation     not a coumadin candidate  . Dementia   . Arthritis   . CAD (coronary artery disease)     a. LHC (8/14):  prox to mid LAD 80, pRCA 30, pPDA 30-40, EF 50%  . Hyponatremia   . NICM (nonischemic cardiomyopathy)    Past Surgical History  Procedure Laterality Date  . Abdominal hysterectomy      TAH BSO  . Spine surgery  1980    Lumbar surgery   Family History  Problem Relation Age of Onset  . Depression    . Heart attack    . Coronary artery disease    . Coronary artery disease Mother 73  . Heart attack Son 26   History  Substance Use Topics  . Smoking status: Never Smoker   . Smokeless tobacco: Never Used  . Alcohol Use: No   OB History    No data available     Review of Systems  Unable to perform ROS   Ten systems reviewed and are negative for acute change,  except as noted in the HPI.   Allergies  Review of patient's allergies indicates no known allergies.  Home Medications   Prior to Admission medications   Medication Sig Start Date End Date Taking? Authorizing Provider  acetaminophen (TYLENOL) 500 MG tablet Take 250 mg by mouth every 6 (six) hours as needed for moderate pain.   Yes Historical Provider, MD  albuterol (PROAIR HFA) 108 (90 BASE) MCG/ACT inhaler Inhale 2 puffs into the lungs every 6 (six) hours as needed for wheezing or shortness of breath. 10/31/13  Yes Lelon Perla, DO  aspirin EC 81 MG EC tablet Take 1 tablet (81 mg total) by mouth daily. 05/29/13  Yes Jeralyn Bennett, MD  atorvastatin (LIPITOR) 20 MG tablet Take 20 mg by mouth daily. 08/21/14  Yes Historical Provider, MD  carvedilol (COREG) 3.125 MG tablet TAKE 1 TABLET BY MOUTH TWICE DAILY WITH MEALS 08/21/14  Yes Kathleene Hazel, MD  diclofenac sodium (VOLTAREN) 1 % GEL Apply 2 g topically 4 (four) times daily. 08/25/14  Yes Yvonne R Lowne, DO  DIGOX 125 MCG tablet TAKE 1 TABLET BY MOUTH DAILY 08/21/14  Yes Kathleene Hazel, MD  donepezil (ARICEPT) 5 MG tablet Take 5 mg by mouth at bedtime.   Yes Historical Provider, MD  furosemide (LASIX) 40  MG tablet Take 80 mg (2 tablets) in the am and 40 mg (1 tablet) in the afternoon 08/11/14  Yes Kathleene Hazelhristopher D McAlhany, MD  oxybutynin (DITROPAN-XL) 5 MG 24 hr tablet TAKE 1 TABLET BY MOUTH EVERY DAY   Yes Yvonne R Lowne, DO  polyethylene glycol powder (MIRALAX) powder Take 17 g by mouth daily.    Yes Historical Provider, MD  QUEtiapine (SEROQUEL) 100 MG tablet TAKE 1 TABLET BY MOUTH AT BEDTIME 05/22/14  Yes Yvonne R Lowne, DO   BP 106/86 mmHg  Pulse 56  Temp(Src) 99.3 F (37.4 C) (Rectal)  Resp 26  SpO2 99% Physical Exam  Constitutional: She appears well-developed and well-nourished. No distress.  HENT:  Head: Normocephalic and atraumatic.  Eyes: Conjunctivae are normal. No scleral icterus.  Neck: Normal range of  motion.  Cardiovascular: Normal rate, regular rhythm, normal heart sounds and intact distal pulses.  Exam reveals no gallop and no friction rub.   No murmur heard. Trace pitting edema in the bilateral lower extremities  Pulmonary/Chest: Effort normal and breath sounds normal. No respiratory distress.  Abdominal: Soft. Bowel sounds are normal. She exhibits no distension and no mass. There is no tenderness. There is no guarding.  Neurological: She is alert.  Speech is clear, she has difficulty following commands Major Cranial nerves without deficit, no facial droop Normal strength in upper and lower extremities bilaterally including dorsiflexion and plantar flexion, strong and equal grip strength Sensation normal to light and sharp touch Moves extremities without ataxia, coordination intact   Skin: Skin is warm and dry. She is not diaphoretic.    ED Course  Procedures (including critical care time) Labs Review Labs Reviewed  CULTURE, BLOOD (ROUTINE X 2)  CULTURE, BLOOD (ROUTINE X 2)  URINE CULTURE  CBC WITH DIFFERENTIAL  COMPREHENSIVE METABOLIC PANEL  URINALYSIS, ROUTINE W REFLEX MICROSCOPIC  I-STAT CG4 LACTIC ACID, ED  Rosezena SensorI-STAT TROPOININ, ED    Imaging Review Dg Chest 2 View  09/09/2014   CLINICAL DATA:  Confusion.  Shortness of breath.  EXAM: CHEST  2 VIEW  COMPARISON:  December 23, 2013.  FINDINGS: The heart size and mediastinal contours are within normal limits. Both lungs are clear. No pneumothorax is noted. Minimal bilateral pleural effusions are noted. Old right rib fractures are noted.  IMPRESSION: Minimal bilateral pleural effusions. No other significant abnormality seen in the chest.   Electronically Signed   By: Roque LiasJames  Green M.D.   On: 09/09/2014 11:51     EKG Interpretation   Date/Time:  Wednesday September 09 2014 10:51:51 EST Ventricular Rate:  53 PR Interval:    QRS Duration: 108 QT Interval:  444 QTC Calculation: 417 R Axis:   -36 Text Interpretation:  Atrial  fibrillation LVH with secondary  repolarization abnormality No significant change since last tracing  Confirmed by YAO  MD, DAVID ([id number]) on 09/09/2014 11:34:04 AM      MDM   Final diagnoses:  Confusion    Filed Vitals:   09/09/14 1049 09/09/14 1055 09/09/14 1100 09/09/14 1201  BP:   106/86 118/90  Pulse: 49  56 55  Temp:  99.3 F (37.4 C)    TempSrc:  Rectal    Resp: 10  26 22   SpO2: 100%  99% 97%   Patient here with episode of altered mental status. Differential includes seizure, TIA, syncope, infection. No code stroke called as patient's symptoms had resolved completely prior to arrival. Vision, also bradycardic on the monitor down into the 30s. This is associated  with her A. fib. Numbness might account for her episodes today. If she syncopized. Patient is followed by Dr.Mcelhany    Urine is clear. Her CT head shows an acute sinusitis on the right side. EKG shows A. fib. Numbness documentation showed pulse of 120 which is not correct. Repeat EKG done at 1:49 PM  Date: 09/09/2014  Rate: 52  Rhythm: atrial fibrillation  QRS Axis: left  Intervals: normal  ST/T Wave abnormalities: normal  Conduction Disutrbances:none  Narrative Interpretation:   Old EKG Reviewed: unchanged   Patient lives show mild anemia, slightly elevated leukocytosis of 11.5. This is mildly elevated. CT head is without acute intracranial abnormality. Does show a right acute sinusitis. Chest x-ray is unremarkable. I spoken with with Dr. Roseanne Reno  Who asks for admission for TIA workup.  Dr. Rhona Leavens will admit.  The patient appears reasonably stabilized for admission considering the current resources, flow, and capabilities available in the ED at this time, and I doubt any other [facility] requiring further screening and/or treatment in the ED prior to admission.   Arthor Captain, PA-C 09/09/14 1556  Richardean Canal, MD 09/10/14 [phone]

## 2014-09-09 NOTE — Progress Notes (Signed)
SLP Cancellation Note  Patient Details Name: ILER LO MRN: [id number] DOB: 03-20-1928   Cancelled treatment:       Reason Eval/Treat Not Completed: SLP screened, no needs identified, will sign off. Orders received for bedside swallow evaluation. Chart review indicates that patient has passed the RN stroke swallow screen and has been initiated on a regular diet. RN denies any concerns about swallowing at this time. Per protocol, will defer swallow evaluation at this time. Please re-order SLP as needed.    Maxcine Ham, M.A. CCC-SLP [phone]  Maxcine Ham 09/09/2014, 4:27 PM

## 2014-09-09 NOTE — Procedures (Signed)
EEG report.  Brief clinical history: 80 year old lady hypertension and atrial fibrillation as well as coronary artery disease presenting with transient reduced responsiveness as well as garbled speech and confusion on baseline afterwards. Etiology is unclear   Technique: this is a 17 channel routine scalp EEG performed at the bedside with bipolar and monopolar montages arranged in accordance to the international 10/20 system of electrode placement. One channel was dedicated to EKG recording.  No sleep was recorded. No activating procedures performed.  Description:In the wakeful state, the best background consisted of a medium amplitude, posterior dominant, well sustained, symmetric and reactive 8 Hz rhythm. No focal or generalized epileptiform discharges noted.  No pathologic areas of slowing seen.  EKG showed sinus rhythm.   Impression: this is a normal awake EEG. Please, be aware that a normal EEG does not exclude the possibility of epilepsy.  Clinical correlation is advised.   Wyatt Portela, MD

## 2014-09-09 NOTE — ED Notes (Signed)
Per EMS- Pt comes from home, where caretaker reports pt had 2 min period of garbled speech and her eyes rolled back. Pt noted to have urinated on self. At house temp was 99.5, was under blankets. AFIB with hx, BP 90/58, HR 61, CBG 135. No neuro deficits per EMS. Pt appears disoriented to time and date. Reports she wants to go home.

## 2014-09-09 NOTE — Progress Notes (Signed)
Patient's heart rate is running in the upper 20's and low 30's then fluctuating between 50's and 60's. MD notified. Patient is alert and asymptomatic at this time. Will continue to monitor.

## 2014-09-09 NOTE — Progress Notes (Signed)
EEG completed; results pending.    

## 2014-09-09 NOTE — Consult Note (Signed)
Reason for Consult: Transient altered mental status with slurred speech.  HPI:                                                                                                                                          [name] is an 80 y.o. female with a history of hypertension, or fibrillation not on anticoagulation, dementia and coronary artery disease brought to the emergency room following an episode of decreased responsiveness with eyes deviated upward and speech about 2 minutes. Patient was noted to be more confused than usual afterwards. She has since returned to her baseline cognitive functioning. No previous history of similar mental status changes. She's currently on aspirin daily. CT scan of her head showed no acute intracranial abnormality. The daughter thought to the face may have been slightly asymmetric at one ,but no clear focal weakness was noted.  Past Medical History  Diagnosis Date  . Hypertension   . Osteoporosis   . Overactive bladder   . Chronic systolic heart failure 3/33/8329    a. Echo (8/14):  mild LVH, EF 35%, diff HK, mild MR  . Atrial fibrillation     not a coumadin candidate  . Dementia   . Arthritis   . CAD (coronary artery disease)     a. LHC (8/14):  prox to mid LAD 80, pRCA 30, pPDA 30-40, EF 50%  . Hyponatremia   . NICM (nonischemic cardiomyopathy)     Past Surgical History  Procedure Laterality Date  . Abdominal hysterectomy      TAH BSO  . Spine surgery  1980    Lumbar surgery    Family History  Problem Relation Age of Onset  . Depression    . Heart attack    . Coronary artery disease    . Coronary artery disease Mother 34  . Heart attack Son 4    Social History:  reports that she has never smoked. She has never used smokeless tobacco. She reports that she does not drink alcohol or use illicit drugs.  No Known Allergies  MEDICATIONS:                                                                                                                      I have reviewed the patient's current medications.   ROS:  History obtained from patient's daughter  General ROS: negative for - chills, fatigue, fever, night sweats, weight gain or weight loss Psychological ROS: Chronic progressive dementia Ophthalmic ROS: negative for - blurry vision, double vision, eye pain or loss of vision ENT ROS: negative for - epistaxis, nasal discharge, oral lesions, sore throat, tinnitus or vertigo Allergy and Immunology ROS: negative for - hives or itchy/watery eyes Hematological and Lymphatic ROS: negative for - bleeding problems, bruising or swollen lymph nodes Endocrine ROS: negative for - galactorrhea, hair pattern changes, polydipsia/polyuria or temperature intolerance Respiratory ROS: negative for - cough, hemoptysis, shortness of breath or wheezing Cardiovascular ROS: negative for - chest pain, dyspnea on exertion, edema or irregular heartbeat Gastrointestinal ROS: negative for - abdominal pain, diarrhea, hematemesis, nausea/vomiting or stool incontinence Genito-Urinary ROS: negative for - dysuria, hematuria, incontinence or urinary frequency/urgency Musculoskeletal ROS: Able to transfer but unable to ambulate Neurological ROS: as noted in HPI Dermatological ROS: negative for rash and skin lesion changes   Blood pressure 120/51, pulse 65, temperature 99.3 F (37.4 C), temperature source Rectal, resp. rate 20, SpO2 100 %.   Neurologic Examination:                                                                                                      Mental Status: Alert, disoriented and moderately confused.  Speech fluent without evidence of aphasia. Able to follow commands without difficulty. Cranial Nerves: II-Visual fields were normal. III/IV/VI-Pupils were equal and reacted. Extraocular movements were  full and conjugate.    V/VII-no facial numbness and no facial weakness. VIII-normal. X-normal speech and symmetrical palatal movement. Motor: 5/5 bilaterally with normal tone and bulk Sensory: Normal throughout. Deep Tendon Reflexes: Trace to 1+ and upper extremities and absent in lower extremities; moderate active frontal release signs. Plantars: Mute bilaterally Cerebellar: Normal finger-to-nose testing except for mild intention tremor bilaterally.  Lab Results  Component Value Date/Time   CHOL 126 05/23/2013 05:00 AM    Results for orders placed or performed during the hospital encounter of 09/09/14 (from the past 48 hour(s))  CBC with Differential     Status: Abnormal   Collection Time: 09/09/14 11:14 AM  Result Value Ref Range   WBC 11.5 (H) 4.0 - 10.5 K/uL   RBC 4.38 3.87 - 5.11 MIL/uL   Hemoglobin 13.3 12.0 - 15.0 g/dL   HCT 39.7 36.0 - 46.0 %   MCV 90.6 78.0 - 100.0 fL   MCH 30.4 26.0 - 34.0 pg   MCHC 33.5 30.0 - 36.0 g/dL   RDW 13.8 11.5 - 15.5 %   Platelets 259 150 - 400 K/uL   Neutrophils Relative % 72 43 - 77 %   Neutro Abs 8.4 (H) 1.7 - 7.7 K/uL   Lymphocytes Relative 12 12 - 46 %   Lymphs Abs 1.3 0.7 - 4.0 K/uL   Monocytes Relative 12 3 - 12 %   Monocytes Absolute 1.3 (H) 0.1 - 1.0 K/uL   Eosinophils Relative 4 0 - 5 %   Eosinophils Absolute 0.5 0.0 - 0.7 K/uL   Basophils Relative  0 0 - 1 %   Basophils Absolute 0.0 0.0 - 0.1 K/uL  Comprehensive metabolic panel     Status: Abnormal   Collection Time: 09/09/14 11:14 AM  Result Value Ref Range   Sodium 137 137 - 147 mEq/L   Potassium 4.2 3.7 - 5.3 mEq/L   Chloride 97 96 - 112 mEq/L   CO2 27 19 - 32 mEq/L   Glucose, Bld 104 (H) 70 - 99 mg/dL   BUN 21 6 - 23 mg/dL   Creatinine, Ser 0.96 0.50 - 1.10 mg/dL   Calcium 9.0 8.4 - 10.5 mg/dL   Total Protein 6.4 6.0 - 8.3 g/dL   Albumin 2.9 (L) 3.5 - 5.2 g/dL   AST 20 0 - 37 U/L   ALT 21 0 - 35 U/L   Alkaline Phosphatase 102 39 - 117 U/L   Total Bilirubin 0.6  0.3 - 1.2 mg/dL   GFR calc non Af Amer 53 (L) >90 mL/min   GFR calc Af Amer 61 (L) >90 mL/min    Comment: (NOTE) The eGFR has been calculated using the CKD EPI equation. This calculation has not been validated in all clinical situations. eGFR's persistently <90 mL/min signify possible Chronic Kidney Disease.    Anion gap 13 5 - 15  I-stat troponin, ED     Status: None   Collection Time: 09/09/14 12:14 PM  Result Value Ref Range   Troponin i, poc 0.04 0.00 - 0.08 ng/mL   Comment 3            Comment: Due to the release kinetics of cTnI, a negative result within the first hours of the onset of symptoms does not rule out myocardial infarction with certainty. If myocardial infarction is still suspected, repeat the test at appropriate intervals.   I-Stat CG4 Lactic Acid, ED     Status: None   Collection Time: 09/09/14 12:16 PM  Result Value Ref Range   Lactic Acid, Venous 1.19 0.5 - 2.2 mmol/L  Urinalysis, Routine w reflex microscopic     Status: None   Collection Time: 09/09/14 12:22 PM  Result Value Ref Range   Color, Urine YELLOW YELLOW   APPearance CLEAR CLEAR   Specific Gravity, Urine 1.009 1.005 - 1.030   pH 7.5 5.0 - 8.0   Glucose, UA NEGATIVE NEGATIVE mg/dL   Hgb urine dipstick NEGATIVE NEGATIVE   Bilirubin Urine NEGATIVE NEGATIVE   Ketones, ur NEGATIVE NEGATIVE mg/dL   Protein, ur NEGATIVE NEGATIVE mg/dL   Urobilinogen, UA 0.2 0.0 - 1.0 mg/dL   Nitrite NEGATIVE NEGATIVE   Leukocytes, UA NEGATIVE NEGATIVE    Comment: MICROSCOPIC NOT DONE ON URINES WITH NEGATIVE PROTEIN, BLOOD, LEUKOCYTES, NITRITE, OR GLUCOSE <1000 mg/dL.    Dg Chest 2 View  09/09/2014   CLINICAL DATA:  Confusion.  Shortness of breath.  EXAM: CHEST  2 VIEW  COMPARISON:  December 23, 2013.  FINDINGS: The heart size and mediastinal contours are within normal limits. Both lungs are clear. No pneumothorax is noted. Minimal bilateral pleural effusions are noted. Old right rib fractures are noted.  IMPRESSION:  Minimal bilateral pleural effusions. No other significant abnormality seen in the chest.   Electronically Signed   By: Sabino Dick M.D.   On: 09/09/2014 11:51   Ct Head Wo Contrast  09/09/2014   CLINICAL DATA:  Dementia.  Confusion  EXAM: CT HEAD WITHOUT CONTRAST  TECHNIQUE: Contiguous axial images were obtained from the base of the skull through the vertex without intravenous  contrast.  COMPARISON:  01/20/2013  FINDINGS: There is diffuse cerebral atrophy. No acute intracranial abnormality. Specifically, no hemorrhage, hydrocephalus, mass lesion, acute infarction, or significant intracranial injury. No acute calvarial abnormality.  Air-fluid level in the right sphenoid sinus and opacified posterior right ethmoid air cells. Remainder the paranasal sinuses and mastoids are clear.  IMPRESSION: No acute intracranial abnormality.  Right sinusitis, likely acute sinusitis in the right sphenoid sinus.   Electronically Signed   By: Rolm Baptise M.D.   On: 09/09/2014 13:18   Assessment/Plan: 80 year old lady hypertension and atrial fibrillation as well as coronary artery disease presenting with transient reduced responsiveness as well as garbled speech and confusion on baseline afterwards. Etiology is unclear. Bradycardia with associated atrial fibrillation and near syncopal symptoms may account for patient's transient changes. New onset seizure disorder as a manifestation of progression of dementia is also a consideration.  Recommendations: 1. MRI of the brain to rule out possible acute stroke, given her current risk factors. 2. Stroke workup with risk assessment if MRI is acute stroke. 3. Continue aspirin daily. 4. EEG, routine adult study to rule out possible new onset seizure disorder.  We will continue to follow this patient with you.  C.R. Nicole Kindred, MD [facility] [phone]  09/09/2014, 3:22 PM

## 2014-09-09 NOTE — Plan of Care (Signed)
Problem: Consults Goal: Ischemic Stroke Patient Education See Patient Education Module for education specifics. Outcome: Progressing  Problem: Acute Treatment Outcomes Goal: Neuro exam at baseline or improved Outcome: Completed/Met Date Met:  09/09/14 Goal: BP within ordered parameters Outcome: Completed/Met Date Met:  09/09/14 Goal: Airway maintained/protected Outcome: Completed/Met Date Met:  09/09/14 Goal: 02 Sats > 94% Outcome: Completed/Met Date Met:  09/09/14 Goal: Hemodynamically stable Outcome: Progressing Goal: Prognosis discussed with family/patient as appropriate Outcome: Progressing Goal: Other Acute Treatment Outcomes Outcome: Completed/Met Date Met:  09/09/14

## 2014-09-09 NOTE — ED Notes (Signed)
Pt returned from CT °

## 2014-09-10 ENCOUNTER — Encounter (HOSPITAL_COMMUNITY): Payer: Self-pay | Admitting: Cardiovascular Disease

## 2014-09-10 LAB — CBC
HCT: 36.4 % (ref 36.0–46.0)
Hemoglobin: 12.1 g/dL (ref 12.0–15.0)
MCH: 30.1 pg (ref 26.0–34.0)
MCHC: 33.2 g/dL (ref 30.0–36.0)
MCV: 90.5 fL (ref 78.0–100.0)
PLATELETS: 259 10*3/uL (ref 150–400)
RBC: 4.02 MIL/uL (ref 3.87–5.11)
RDW: 13.7 % (ref 11.5–15.5)
WBC: 8.2 10*3/uL (ref 4.0–10.5)

## 2014-09-10 LAB — COMPREHENSIVE METABOLIC PANEL
ALT: 16 U/L (ref 0–35)
AST: 15 U/L (ref 0–37)
Albumin: 2.6 g/dL — ABNORMAL LOW (ref 3.5–5.2)
Alkaline Phosphatase: 91 U/L (ref 39–117)
Anion gap: 11 (ref 5–15)
BUN: 20 mg/dL (ref 6–23)
CALCIUM: 8.7 mg/dL (ref 8.4–10.5)
CO2: 25 mEq/L (ref 19–32)
Chloride: 101 mEq/L (ref 96–112)
Creatinine, Ser: 0.86 mg/dL (ref 0.50–1.10)
GFR calc non Af Amer: 60 mL/min — ABNORMAL LOW (ref >90)
GFR, EST AFRICAN AMERICAN: 70 mL/min — AB (ref >90)
Glucose, Bld: 88 mg/dL (ref 70–99)
Potassium: 4.1 mEq/L (ref 3.7–5.3)
SODIUM: 137 meq/L (ref 137–147)
TOTAL PROTEIN: 5.5 g/dL — AB (ref 6.0–8.3)
Total Bilirubin: 0.6 mg/dL (ref 0.3–1.2)

## 2014-09-10 LAB — URINE CULTURE
Colony Count: NO GROWTH
Culture: NO GROWTH

## 2014-09-10 MED ORDER — AZITHROMYCIN 500 MG PO TABS
500.0000 mg | ORAL_TABLET | Freq: Every day | ORAL | Status: AC
  Administered 2014-09-10: 500 mg via ORAL
  Filled 2014-09-10: qty 1

## 2014-09-10 MED ORDER — AZITHROMYCIN 250 MG PO TABS: 250.0000 mg | ORAL_TABLET | Freq: Every day | ORAL | Status: DC

## 2014-09-10 MED ORDER — AZITHROMYCIN 250 MG PO TABS: ORAL_TABLET | ORAL | Status: DC

## 2014-09-10 NOTE — Discharge Summary (Signed)
Physician Discharge Summary  [name]:[id number] DOB: Aug 07, 1928 DOA: 09/09/2014  PCP: [name], DO  Admit date: 09/09/2014 Discharge date: 09/10/2014  Time spent: 30 minutes  Recommendations for Outpatient Follow-up:  1. Follow up with PCP in 1-2 weeks 2. Follow up with Dr. Clifton James at earliest appt  Discharge Diagnoses:  Active Problems:   Essential hypertension   Osteoarthritis   Senile osteoporosis   Atrial fibrillation with RVR   Dementia with behavioral disturbance   Non-ischemic cardiomyopathy   TIA (transient ischemic attack)   Altered mental status   Discharge Condition: Improved  Diet recommendation: Regular  Filed Weights   09/09/14 1540  Weight: 63.912 kg (140 lb 14.4 oz)    History of present illness:  Please see admit h and p from 12/9 for details. Briefly, pt presents with acute delirium with slurred speech. Pt was admitted for further work up.  Hospital Course:  The patient was admitted to the floor. MRI was neg for acute CVA. EEG was found to be unremarkable. Of note, the patient was found to have bradycardia with hr as low as the 20-30's. She seemed asymptomatic on the floor. Scheduled coreg was stopped with improvement in blood pressure as well as normalization of heart rate. The patient remained medically stable overnight. Neurology had been following and per Neurology, the patient is medically stable for discharge with close follow up with her Cardiologist.  Consultations:  Cardiology  Discharge Exam: Filed Vitals:   09/10/14 0000 09/10/14 0200 09/10/14 0400 09/10/14 0600  BP: 100/25 112/56 112/55 109/59  Pulse: 61 57 61 61  Temp: 98 F (36.7 C) 98.1 F (36.7 C) 98.1 F (36.7 C) 98.2 F (36.8 C)  TempSrc: Oral Oral Oral Oral  Resp: 16 16 16 16   Height:      Weight:      SpO2:  98% 98% 98%    General: Awake, in nad Cardiovascular: Regular, s1, s2 Respiratory: normal resp effort, no wheezing  Discharge Instructions     Medication List    STOP taking these medications        carvedilol 3.125 MG tablet  Commonly known as:  COREG      TAKE these medications        acetaminophen 500 MG tablet  Commonly known as:  TYLENOL  Take 250 mg by mouth every 6 (six) hours as needed for moderate pain.     albuterol 108 (90 BASE) MCG/ACT inhaler  Commonly known as:  PROAIR HFA  Inhale 2 puffs into the lungs every 6 (six) hours as needed for wheezing or shortness of breath.     aspirin 81 MG EC tablet  Take 1 tablet (81 mg total) by mouth daily.     atorvastatin 20 MG tablet  Commonly known as:  LIPITOR  Take 20 mg by mouth daily.     azithromycin 250 MG tablet  Commonly known as:  ZITHROMAX  2 tabs on day one, then 1 tab po daily for a total of 5 days of treatment, zero refills     diclofenac sodium 1 % Gel  Commonly known as:  VOLTAREN  Apply 2 g topically 4 (four) times daily.     DIGOX 0.125 MG tablet  Generic drug:  digoxin  TAKE 1 TABLET BY MOUTH DAILY     donepezil 5 MG tablet  Commonly known as:  ARICEPT  Take 5 mg by mouth at bedtime.     furosemide 40 MG tablet  Commonly known as:  LASIX  Take 80 mg (2 tablets) in the am and 40 mg (1 tablet) in the afternoon     MIRALAX powder  Generic drug:  polyethylene glycol powder  Take 17 g by mouth daily.     oxybutynin 5 MG 24 hr tablet  Commonly known as:  DITROPAN-XL  TAKE 1 TABLET BY MOUTH EVERY DAY     QUEtiapine 100 MG tablet  Commonly known as:  SEROQUEL  TAKE 1 TABLET BY MOUTH AT BEDTIME       No Known Allergies Follow-up Information    Follow up with [name], DO. Schedule an appointment as soon as possible for a visit in 1 week.   Specialty:  Family Medicine   Contact information:   [address] [place] [address] [phone]       Follow up with [name], MD.   Specialty:  Cardiology   Why:  09/14/14 at 11:15am   Contact information:   [address] [place]  [place] [address] [phone]        The results of significant diagnostics from this hospitalization (including imaging, microbiology, ancillary and laboratory) are listed below for reference.    Significant Diagnostic Studies: Dg Chest 2 View  09/09/2014   CLINICAL DATA:  Confusion.  Shortness of breath.  EXAM: CHEST  2 VIEW  COMPARISON:  December 23, 2013.  FINDINGS: The heart size and mediastinal contours are within normal limits. Both lungs are clear. No pneumothorax is noted. Minimal bilateral pleural effusions are noted. Old right rib fractures are noted.  IMPRESSION: Minimal bilateral pleural effusions. No other significant abnormality seen in the chest.   Electronically Signed   By: Roque Lias M.D.   On: 09/09/2014 11:51   Ct Head Wo Contrast  09/09/2014   CLINICAL DATA:  Dementia.  Confusion  EXAM: CT HEAD WITHOUT CONTRAST  TECHNIQUE: Contiguous axial images were obtained from the base of the skull through the vertex without intravenous contrast.  COMPARISON:  01/20/2013  FINDINGS: There is diffuse cerebral atrophy. No acute intracranial abnormality. Specifically, no hemorrhage, hydrocephalus, mass lesion, acute infarction, or significant intracranial injury. No acute calvarial abnormality.  Air-fluid level in the right sphenoid sinus and opacified posterior right ethmoid air cells. Remainder the paranasal sinuses and mastoids are clear.  IMPRESSION: No acute intracranial abnormality.  Right sinusitis, likely acute sinusitis in the right sphenoid sinus.   Electronically Signed   By: Charlett Nose M.D.   On: 09/09/2014 13:18   Mr [name] Head Wo Contrast  09/09/2014   CLINICAL DATA:  Episode of decreased responsiveness and abnormal speech. The patient was subsequently confused. She has since returned to baseline. TIA.  EXAM: MRI HEAD WITHOUT CONTRAST  MRA HEAD WITHOUT CONTRAST  TECHNIQUE: Multiplanar, multiecho pulse sequences of the brain and surrounding structures were obtained without intravenous  contrast. Angiographic images of the head were obtained using MRA technique without contrast.  COMPARISON:  CT head from the same day.  FINDINGS: MRI HEAD FINDINGS  The study is moderately degraded by patient motion. The diffusion-weighted images demonstrate no evidence for acute or subacute infarction. Mild to moderate generalized atrophy and white matter disease is present bilaterally. No acute hemorrhage or mass lesion is present. The ventricles are proportionate to the degree of atrophy. Flow is present in the major intracranial arteries. The patient is status post bilateral lens replacements. There is chronic opacification of the right sphenoid sinus and posterior ethmoid air cells. Mild mucosal  thickening is present in the anterior ethmoid air cells bilaterally, worse on the right as well as the inferior right frontal sinus. The mastoid air cells are clear.  MRA HEAD FINDINGS  Patient motion degrades imaging of the small vessels.  Mild atherosclerotic changes are noted within the cavernous and ophthalmic segments of the internal carotid arteries bilaterally without significant stenosis. The A1 and M1 segments are normal. The anterior communicating artery is patent. The MCA bifurcations are intact. There is moderate attenuation of MCA branch vessels bilaterally.  The left vertebral artery is dominant. The left PICA origin is visualized. There is marked attenuation of the left PICA. The right PICA is not visualized. The basilar artery is normal. Both posterior cerebral arteries originate from the basilar tip. There is mild attenuation of distal PCA branch vessels.  IMPRESSION: 1. No acute infarct. 2. Mild to moderate generalized atrophy and white matter disease is present bilaterally. 3. Moderate small vessel disease, particularly in the anterior circulation. This may be some exaggerated by patient motion on the MRA. 4. No significant proximal stenosis, aneurysm, or branch vessel occlusion. 5. Acute on chronic  right sphenoid sinusitis. 6. More mild mucosal thickening in the right greater than left ethmoid air cells and inferior right frontal sinus.   Electronically Signed   By: Gennette Pachris  Mattern M.D.   On: 09/09/2014 20:26   Mr Brain Wo Contrast  09/09/2014   CLINICAL DATA:  Episode of decreased responsiveness and abnormal speech. The patient was subsequently confused. She has since returned to baseline. TIA.  EXAM: MRI HEAD WITHOUT CONTRAST  MRA HEAD WITHOUT CONTRAST  TECHNIQUE: Multiplanar, multiecho pulse sequences of the brain and surrounding structures were obtained without intravenous contrast. Angiographic images of the head were obtained using MRA technique without contrast.  COMPARISON:  CT head from the same day.  FINDINGS: MRI HEAD FINDINGS  The study is moderately degraded by patient motion. The diffusion-weighted images demonstrate no evidence for acute or subacute infarction. Mild to moderate generalized atrophy and white matter disease is present bilaterally. No acute hemorrhage or mass lesion is present. The ventricles are proportionate to the degree of atrophy. Flow is present in the major intracranial arteries. The patient is status post bilateral lens replacements. There is chronic opacification of the right sphenoid sinus and posterior ethmoid air cells. Mild mucosal thickening is present in the anterior ethmoid air cells bilaterally, worse on the right as well as the inferior right frontal sinus. The mastoid air cells are clear.  MRA HEAD FINDINGS  Patient motion degrades imaging of the small vessels.  Mild atherosclerotic changes are noted within the cavernous and ophthalmic segments of the internal carotid arteries bilaterally without significant stenosis. The A1 and M1 segments are normal. The anterior communicating artery is patent. The MCA bifurcations are intact. There is moderate attenuation of MCA branch vessels bilaterally.  The left vertebral artery is dominant. The left PICA origin is  visualized. There is marked attenuation of the left PICA. The right PICA is not visualized. The basilar artery is normal. Both posterior cerebral arteries originate from the basilar tip. There is mild attenuation of distal PCA branch vessels.  IMPRESSION: 1. No acute infarct. 2. Mild to moderate generalized atrophy and white matter disease is present bilaterally. 3. Moderate small vessel disease, particularly in the anterior circulation. This may be some exaggerated by patient motion on the MRA. 4. No significant proximal stenosis, aneurysm, or branch vessel occlusion. 5. Acute on chronic right sphenoid sinusitis. 6. More mild mucosal  thickening in the right greater than left ethmoid air cells and inferior right frontal sinus.   Electronically Signed   By: Gennette Pac M.D.   On: 09/09/2014 20:26    Microbiology: No results found for this or any previous visit (from the past 240 hour(s)).   Labs: Basic Metabolic Panel:  Recent Labs Lab 09/09/14 1114 09/09/14 1459 09/10/14 0605  NA 137  --  137  K 4.2  --  4.1  CL 97  --  101  CO2 27  --  25  GLUCOSE 104*  --  88  BUN 21  --  20  CREATININE 0.96 0.91 0.86  CALCIUM 9.0  --  8.7   Liver Function Tests:  Recent Labs Lab 09/09/14 1114 09/10/14 0605  AST 20 15  ALT 21 16  ALKPHOS 102 91  BILITOT 0.6 0.6  PROT 6.4 5.5*  ALBUMIN 2.9* 2.6*   No results for input(s): LIPASE, AMYLASE in the last 168 hours. No results for input(s): AMMONIA in the last 168 hours. CBC:  Recent Labs Lab 09/09/14 1114 09/09/14 1459 09/10/14 0605  WBC 11.5* 10.4 8.2  NEUTROABS 8.4*  --   --   HGB 13.3 11.9* 12.1  HCT 39.7 36.4 36.4  MCV 90.6 90.8 90.5  PLT 259 250 259   Cardiac Enzymes: No results for input(s): CKTOTAL, CKMB, CKMBINDEX, TROPONINI in the last 168 hours. BNP: BNP (last 3 results)  Recent Labs  12/02/13 1610  PROBNP 7689.0*   CBG: No results for input(s): GLUCAP in the last 168 hours.  Signed:  CHIU, Scheryl Marten  [facility] 09/10/2014, 9:54 AM

## 2014-09-10 NOTE — Progress Notes (Signed)
Subjective:  Patient is currently back to baseline per daughter who is at bedside. The facial asymmetry has resolved.   Objective: Current vital signs: BP 109/59 mmHg  Pulse 61  Temp(Src) 98.2 F (36.8 C) (Oral)  Resp 16  Ht 5\' 4"  (1.626 m)  Wt 63.912 kg (140 lb 14.4 oz)  BMI 24.17 kg/m2  SpO2 98% Vital signs in last 24 hours: Temp:  [97.6 F (36.4 C)-99.3 F (37.4 C)] 98.2 F (36.8 C) (12/10 0600) Pulse Rate:  [37-102] 61 (12/10 0600) Resp:  [10-26] 16 (12/10 0600) BP: (100-129)/(25-90) 109/59 mmHg (12/10 0600) SpO2:  [94 %-100 %] 98 % (12/10 0600) Weight:  [63.912 kg (140 lb 14.4 oz)] 63.912 kg (140 lb 14.4 oz) (12/09 1540)  Intake/Output from previous day:   Intake/Output this shift:   Nutritional status: Diet regular  Neurologic Exam: General: NAD Mental Status: Alert, oriented to year, hospital and daughter at bedside.  thought content appropriate.  Speech fluent without evidence of aphasia.  Able to follow 3 step commands without difficulty. Cranial Nerves: II:  Visual fields grossly normal, pupils equal, round, reactive to light and accommodation III,IV, VI: ptosis not present, extra-ocular motions intact bilaterally V,VII: smile symmetric, facial light touch sensation normal bilaterally VIII: hearing normal bilaterally IX,X: gag reflex present XI: bilateral shoulder shrug XII: midline tongue extension without atrophy or fasciculations  Motor: Moving all extremities antigravity.  Sensory: Pinprick and light touch intact throughout, bilaterally Deep Tendon Reflexes:  1+ bilateral UE and no LE DTR.   Plantars: Mute bilaterally    Lab Results: Basic Metabolic Panel:  Recent Labs Lab 09/09/14 1114 09/09/14 1459 09/10/14 0605  NA 137  --  137  K 4.2  --  4.1  CL 97  --  101  CO2 27  --  25  GLUCOSE 104*  --  88  BUN 21  --  20  CREATININE 0.96 0.91 0.86  CALCIUM 9.0  --  8.7    Liver Function Tests:  Recent Labs Lab 09/09/14 1114  09/10/14 0605  AST 20 15  ALT 21 16  ALKPHOS 102 91  BILITOT 0.6 0.6  PROT 6.4 5.5*  ALBUMIN 2.9* 2.6*   No results for input(s): LIPASE, AMYLASE in the last 168 hours. No results for input(s): AMMONIA in the last 168 hours.  CBC:  Recent Labs Lab 09/09/14 1114 09/09/14 1459 09/10/14 0605  WBC 11.5* 10.4 8.2  NEUTROABS 8.4*  --   --   HGB 13.3 11.9* 12.1  HCT 39.7 36.4 36.4  MCV 90.6 90.8 90.5  PLT 259 250 259    Cardiac Enzymes: No results for input(s): CKTOTAL, CKMB, CKMBINDEX, TROPONINI in the last 168 hours.  Lipid Panel: No results for input(s): CHOL, TRIG, HDL, CHOLHDL, VLDL, LDLCALC in the last 168 hours.  CBG: No results for input(s): GLUCAP in the last 168 hours.  Microbiology: Results for orders placed or performed during the hospital encounter of 05/20/13  Blood culture (routine x 2)     Status: None   Collection Time: 05/20/13  5:30 PM  Result Value Ref Range Status   Specimen Description BLOOD RIGHT ANTECUBITAL  Final   Special Requests BOTTLES DRAWN AEROBIC AND ANAEROBIC 10CC  Final   Culture  Setup Time   Final    05/21/2013 01:29 Performed at [facility]   Culture   Final    NO GROWTH 5 DAYS Performed at [facility]   Report Status 05/27/2013 FINAL  Final  Blood  culture (routine x 2)     Status: None   Collection Time: 05/20/13  5:45 PM  Result Value Ref Range Status   Specimen Description BLOOD LEFT ANTECUBITAL  Final   Special Requests BOTTLES DRAWN AEROBIC AND ANAEROBIC 10CC  Final   Culture  Setup Time   Final    05/21/2013 02:12 Performed at [facility]   Culture   Final    NO GROWTH 5 DAYS Performed at [facility]   Report Status 05/27/2013 FINAL  Final  Urine culture     Status: None   Collection Time: 05/21/13  2:30 AM  Result Value Ref Range Status   Specimen Description URINE, CLEAN CATCH  Final   Special Requests NONE  Final   Culture  Setup Time   Final    05/21/2013 05:30 Performed at  [facility]   Colony Count NO GROWTH Performed at [facility]  Final   Culture NO GROWTH Performed at [facility]  Final   Report Status 05/22/2013 FINAL  Final    Coagulation Studies: No results for input(s): LABPROT, INR in the last 72 hours.  Imaging: Dg Chest 2 View  09/09/2014   CLINICAL DATA:  Confusion.  Shortness of breath.  EXAM: CHEST  2 VIEW  COMPARISON:  December 23, 2013.  FINDINGS: The heart size and mediastinal contours are within normal limits. Both lungs are clear. No pneumothorax is noted. Minimal bilateral pleural effusions are noted. Old right rib fractures are noted.  IMPRESSION: Minimal bilateral pleural effusions. No other significant abnormality seen in the chest.   Electronically Signed   By: Roque Lias M.D.   On: 09/09/2014 11:51   Ct Head Wo Contrast  09/09/2014   CLINICAL DATA:  Dementia.  Confusion  EXAM: CT HEAD WITHOUT CONTRAST  TECHNIQUE: Contiguous axial images were obtained from the base of the skull through the vertex without intravenous contrast.  COMPARISON:  01/20/2013  FINDINGS: There is diffuse cerebral atrophy. No acute intracranial abnormality. Specifically, no hemorrhage, hydrocephalus, mass lesion, acute infarction, or significant intracranial injury. No acute calvarial abnormality.  Air-fluid level in the right sphenoid sinus and opacified posterior right ethmoid air cells. Remainder the paranasal sinuses and mastoids are clear.  IMPRESSION: No acute intracranial abnormality.  Right sinusitis, likely acute sinusitis in the right sphenoid sinus.   Electronically Signed   By: Charlett Nose M.D.   On: 09/09/2014 13:18   Mr [name] Head Wo Contrast  09/09/2014   CLINICAL DATA:  Episode of decreased responsiveness and abnormal speech. The patient was subsequently confused. She has since returned to baseline. TIA.  EXAM: MRI HEAD WITHOUT CONTRAST  MRA HEAD WITHOUT CONTRAST  TECHNIQUE: Multiplanar, multiecho pulse sequences of the brain  and surrounding structures were obtained without intravenous contrast. Angiographic images of the head were obtained using MRA technique without contrast.  COMPARISON:  CT head from the same day.  FINDINGS: MRI HEAD FINDINGS  The study is moderately degraded by patient motion. The diffusion-weighted images demonstrate no evidence for acute or subacute infarction. Mild to moderate generalized atrophy and white matter disease is present bilaterally. No acute hemorrhage or mass lesion is present. The ventricles are proportionate to the degree of atrophy. Flow is present in the major intracranial arteries. The patient is status post bilateral lens replacements. There is chronic opacification of the right sphenoid sinus and posterior ethmoid air cells. Mild mucosal thickening is present in the anterior ethmoid air cells bilaterally, worse on the  right as well as the inferior right frontal sinus. The mastoid air cells are clear.  MRA HEAD FINDINGS  Patient motion degrades imaging of the small vessels.  Mild atherosclerotic changes are noted within the cavernous and ophthalmic segments of the internal carotid arteries bilaterally without significant stenosis. The A1 and M1 segments are normal. The anterior communicating artery is patent. The MCA bifurcations are intact. There is moderate attenuation of MCA branch vessels bilaterally.  The left vertebral artery is dominant. The left PICA origin is visualized. There is marked attenuation of the left PICA. The right PICA is not visualized. The basilar artery is normal. Both posterior cerebral arteries originate from the basilar tip. There is mild attenuation of distal PCA branch vessels.  IMPRESSION: 1. No acute infarct. 2. Mild to moderate generalized atrophy and white matter disease is present bilaterally. 3. Moderate small vessel disease, particularly in the anterior circulation. This may be some exaggerated by patient motion on the MRA. 4. No significant proximal  stenosis, aneurysm, or branch vessel occlusion. 5. Acute on chronic right sphenoid sinusitis. 6. More mild mucosal thickening in the right greater than left ethmoid air cells and inferior right frontal sinus.   Electronically Signed   By: Gennette Pac M.D.   On: 09/09/2014 20:26   Mr Brain Wo Contrast  09/09/2014   CLINICAL DATA:  Episode of decreased responsiveness and abnormal speech. The patient was subsequently confused. She has since returned to baseline. TIA.  EXAM: MRI HEAD WITHOUT CONTRAST  MRA HEAD WITHOUT CONTRAST  TECHNIQUE: Multiplanar, multiecho pulse sequences of the brain and surrounding structures were obtained without intravenous contrast. Angiographic images of the head were obtained using MRA technique without contrast.  COMPARISON:  CT head from the same day.  FINDINGS: MRI HEAD FINDINGS  The study is moderately degraded by patient motion. The diffusion-weighted images demonstrate no evidence for acute or subacute infarction. Mild to moderate generalized atrophy and white matter disease is present bilaterally. No acute hemorrhage or mass lesion is present. The ventricles are proportionate to the degree of atrophy. Flow is present in the major intracranial arteries. The patient is status post bilateral lens replacements. There is chronic opacification of the right sphenoid sinus and posterior ethmoid air cells. Mild mucosal thickening is present in the anterior ethmoid air cells bilaterally, worse on the right as well as the inferior right frontal sinus. The mastoid air cells are clear.  MRA HEAD FINDINGS  Patient motion degrades imaging of the small vessels.  Mild atherosclerotic changes are noted within the cavernous and ophthalmic segments of the internal carotid arteries bilaterally without significant stenosis. The A1 and M1 segments are normal. The anterior communicating artery is patent. The MCA bifurcations are intact. There is moderate attenuation of MCA branch vessels bilaterally.   The left vertebral artery is dominant. The left PICA origin is visualized. There is marked attenuation of the left PICA. The right PICA is not visualized. The basilar artery is normal. Both posterior cerebral arteries originate from the basilar tip. There is mild attenuation of distal PCA branch vessels.  IMPRESSION: 1. No acute infarct. 2. Mild to moderate generalized atrophy and white matter disease is present bilaterally. 3. Moderate small vessel disease, particularly in the anterior circulation. This may be some exaggerated by patient motion on the MRA. 4. No significant proximal stenosis, aneurysm, or branch vessel occlusion. 5. Acute on chronic right sphenoid sinusitis. 6. More mild mucosal thickening in the right greater than left ethmoid air cells and inferior right  frontal sinus.   Electronically Signed   By: Gennette Pachris  Mattern M.D.   On: 09/09/2014 20:26    Medications:  Scheduled: . aspirin EC  81 mg Oral Daily  . atorvastatin  20 mg Oral Daily  . azithromycin  500 mg Oral Daily   Followed by  . [START ON 09/11/2014] azithromycin  250 mg Oral Daily  . digoxin  125 mcg Oral Daily  . donepezil  5 mg Oral QHS  . furosemide  40 mg Oral Q2000  . furosemide  80 mg Oral Daily  . heparin  5,000 Units Subcutaneous 3 times per day  . oxybutynin  5 mg Oral Daily  . polyethylene glycol  17 g Oral Daily  . QUEtiapine  100 mg Oral QHS  . sodium chloride  3 mL Intravenous Q12H    Assessment/Plan: [age] year old lady hypertension and atrial fibrillation as well as coronary artery disease presenting with transient reduced responsiveness as well as garbled speech and confusion on baseline afterwards. Etiology is unclear. Bradycardia with associated atrial fibrillation and near syncopal symptoms likely cause for patient's transient changes. MRI/MRA head shows no acute infarct. EEG was normal.    At this time would not place patient on AED. No further inpatient neurological work up recommended.     Neurology will S/O   Felicie MornDavid Shakil Dirk PA-C [facility] [phone]  09/10/2014, 9:15 AM

## 2014-09-10 NOTE — Progress Notes (Signed)
Discharge instructions reviewed with patient/family. All questions answered at this time. No other distress or concerns voice. Transport by family.  Sim Boast, RN

## 2014-09-11 ENCOUNTER — Telehealth: Payer: Self-pay | Admitting: Cardiovascular Disease

## 2014-09-11 ENCOUNTER — Ambulatory Visit (INDEPENDENT_AMBULATORY_CARE_PROVIDER_SITE_OTHER): Payer: Medicare Other | Admitting: Cardiology

## 2014-09-11 ENCOUNTER — Encounter: Payer: Self-pay | Admitting: Cardiology

## 2014-09-11 VITALS — BP 115/62 | HR 69 | Ht 64.0 in | Wt 140.0 lb

## 2014-09-11 DIAGNOSIS — I482 Chronic atrial fibrillation: Secondary | ICD-10-CM

## 2014-09-11 DIAGNOSIS — R001 Bradycardia, unspecified: Secondary | ICD-10-CM

## 2014-09-11 DIAGNOSIS — F03918 Unspecified dementia, unspecified severity, with other behavioral disturbance: Secondary | ICD-10-CM

## 2014-09-11 DIAGNOSIS — I4821 Permanent atrial fibrillation: Secondary | ICD-10-CM

## 2014-09-11 DIAGNOSIS — R0609 Other forms of dyspnea: Secondary | ICD-10-CM

## 2014-09-11 DIAGNOSIS — I429 Cardiomyopathy, unspecified: Secondary | ICD-10-CM

## 2014-09-11 DIAGNOSIS — G458 Other transient cerebral ischemic attacks and related syndromes: Secondary | ICD-10-CM

## 2014-09-11 DIAGNOSIS — R0600 Dyspnea, unspecified: Secondary | ICD-10-CM

## 2014-09-11 DIAGNOSIS — I428 Other cardiomyopathies: Secondary | ICD-10-CM

## 2014-09-11 DIAGNOSIS — F0391 Unspecified dementia with behavioral disturbance: Secondary | ICD-10-CM

## 2014-09-11 NOTE — Telephone Encounter (Signed)
Follow up     Patient did take her medications this am at 7:30.

## 2014-09-11 NOTE — Assessment & Plan Note (Signed)
No complaints of angina- med Rx

## 2014-09-11 NOTE — Patient Instructions (Signed)
Your physician recommends that you continue on your current medications as directed. Please refer to the Current Medication list given to you today.  Your physician recommends that you schedule a follow-up appointment in: January 2016

## 2014-09-11 NOTE — Assessment & Plan Note (Signed)
Hospitalized 09/09/14 with MS changes ultimately felt to be secondary to slow HR- not TIA or CVA

## 2014-09-11 NOTE — Telephone Encounter (Signed)
Agree. Thanks. cdm 

## 2014-09-11 NOTE — Telephone Encounter (Signed)
Son calls in today b/c his sister ( who is a Therapist, music) reported to him "mom is short of breath, her breathing is shallow & is huffing & puffing to breathe".  No chest discomfort or angina according to son at this time. Son is not at pts home.  He states pt was discharged yesterday from the hospital b/c they thought she had a stroke but it was found to be sinusitis.  He states the MD did discontinue the carvedilol b/c her blood pressure & heart rate were too low " they normalized over night"  He will check with sister if their nonweight bearing mother has any increased edema & if she has taken her medications. Forwarded to Dr. Gibson Ramp nurse Dennie Bible.  Mylo Red RN

## 2014-09-11 NOTE — Assessment & Plan Note (Signed)
Family reports she was "breathing funny" this am

## 2014-09-11 NOTE — Assessment & Plan Note (Signed)
Reportedly she had sustained low HR in the hospital though I could not find an EKG or telemetry strip demonstrating this.

## 2014-09-11 NOTE — Telephone Encounter (Signed)
Spoke with pt's son. He reports pt did not have shortness of breath when in the ED.  Was at baseline until this AM.  No increased swelling in feet, ankles or abdomen.  Taking Lasix 80 mg in the AM and 40 in the PM.  Son is not with pt but he reports he does not think shortness of breath is severe enough that pt needs to go to ED.  Son concerned about pt's low heart rate in ED and Coreg being discontinued.  Appt made for pt to see Corine Shelter, PA today at 11:30

## 2014-09-11 NOTE — Progress Notes (Signed)
09/11/2014 [name]   21-Mar-1928  [id number]  Primary Physician Deborah Freud, DO Primary Cardiologist: Dr Deborah Kava  HPI:  Pleasant 80 y/o female with CAF, CAD with an 80% LAD, CM with an EF of 35%, and significant dementia. She was evaluated in Aug 2014. It was decided she was not a candidate for anticoagulation. She was treated with rate control and ASA. She was not having angina and her CAD has been treated medically as well. She was recently admitted to [facility] 09/09/14 with MS changes. Initially there was concern she had had a TIA or CVA. Neuro w/u was negative. She was reported to have slow HR "into the 20's". Her Coreg was stopped. She has done well since discharge. This am the family thought she was "breathing funny" with short shallow respirations. She is seen no as an add on.    Current Outpatient Prescriptions  Medication Sig Dispense Refill  . acetaminophen (TYLENOL) 500 MG tablet Take 250 mg by mouth every 6 (six) hours as needed for moderate pain.    Marland Kitchen albuterol (PROAIR HFA) 108 (90 BASE) MCG/ACT inhaler Inhale 2 puffs into the lungs every 6 (six) hours as needed for wheezing or shortness of breath. 1 Inhaler 5  . aspirin EC 81 MG EC tablet Take 1 tablet (81 mg total) by mouth daily. 30 tablet 0  . atorvastatin (LIPITOR) 20 MG tablet Take 20 mg by mouth daily.    . diclofenac sodium (VOLTAREN) 1 % GEL Apply 2 g topically 4 (four) times daily. 100 g 3  . DIGOX 125 MCG tablet TAKE 1 TABLET BY MOUTH DAILY 30 tablet 0  . donepezil (ARICEPT) 5 MG tablet Take 5 mg by mouth at bedtime.    . furosemide (LASIX) 40 MG tablet Take 80 mg (2 tablets) in the am and 40 mg (1 tablet) in the afternoon 90 tablet 3  . oxybutynin (DITROPAN-XL) 5 MG 24 hr tablet TAKE 1 TABLET BY MOUTH EVERY DAY 30 tablet 11  . polyethylene glycol powder (MIRALAX) powder Take 17 g by mouth daily.     . QUEtiapine (SEROQUEL) 100 MG tablet TAKE 1 TABLET BY MOUTH AT BEDTIME 30 tablet 11   No current  facility-administered medications for this visit.    No Known Allergies  History   Social History  . Marital Status: Married    Spouse Name: N/A    Number of Children: N/A  . Years of Education: N/A   Occupational History  . retired     Engineer, civil (consulting)  . volunteers at hospice    Social History Main Topics  . Smoking status: Never Smoker   . Smokeless tobacco: Never Used  . Alcohol Use: No  . Drug Use: No  . Sexual Activity: No   Other Topics Concern  . Not on file   Social History Narrative   Pt lives at home with around the clock care     Review of Systems: Unobtainable secondary to dementia    Blood pressure 115/62, pulse 69, height 5\' 4"  (1.626 m), weight 140 lb (63.504 kg), SpO2 97 %.  General appearance: alert, cooperative, cachectic, no distress and dementia Lungs: few crackles Lt base Heart: irregularly irregular rhythm Extremities: no edema   ASSESSMENT AND PLAN:   Bradycardia- Coreg stopped Dec 2015 Reportedly she had sustained low HR in the hospital though I could not find an EKG or telemetry strip demonstrating this.  TIA (transient ischemic attack) Hospitalized 09/09/14 with MS changes ultimately felt to  be secondary to slow HR- not TIA or CVA  DOE (dyspnea on exertion) Family reports she was "breathing funny" this am  CAD- 80% LAD Aug 2014- med Rx No complaints of angina- med Rx  Dementia-not felt to be a candidate for anticoagulation Significant dementia  NICM- EF 35% Aug 2014 No CHF on exam  Permanent atrial fibrillation Rate controlled   PLAN  I do not think Ms Deborah MansonSevera is in CHF. I reassured the family that her HR is OK off Coreg. I asked them to monitor this and let us know if her HR is sustained > 100. I'll see if we can move up her apt with Dr Deborah KavaMcAlhaney to [address].   [name] [facility]-C 09/11/2014 12:08 PM

## 2014-09-11 NOTE — Telephone Encounter (Signed)
New problem    Pt is having SOB per pt's son calling

## 2014-09-11 NOTE — Assessment & Plan Note (Signed)
Rate controlled 

## 2014-09-11 NOTE — Assessment & Plan Note (Signed)
No CHF on exam 

## 2014-09-11 NOTE — Assessment & Plan Note (Signed)
Significant dementia

## 2014-09-14 ENCOUNTER — Telehealth: Payer: Self-pay

## 2014-09-14 ENCOUNTER — Encounter: Payer: Medicare Other | Admitting: Physician Assistant

## 2014-09-14 NOTE — Telephone Encounter (Signed)
Admit date: 09/09/2014 Discharge date: 09/10/2014  Reason for admission:  TIA  Recommendations for Outpatient Follow-up:  1. Follow up with PCP in 1-2 weeks 2. Follow up with Dr. Clifton James at earliest appt   Information below provided by Sunny Schlein, patient's son.    Transition Care Management Follow-up Telephone Call  How have you been since you were released from the hospital? Son states mother is doing okay.  HR is normalized and this has helped.    Do you understand why you were in the hospital? yes   Do you understand the discharge instructions? yes  Items Reviewed:  Medications reviewed: yes  Allergies reviewed: yes  Dietary changes reviewed: yes  Referrals reviewed: yes, has appt with Cardiologist   Functional Questionnaire:   Activities of Daily Living (ADLs):   She states they are independent in the following: requires assistance States they require assistance with the following: requires assistance    Any transportation issues/concerns?: no   Any patient concerns? no   Confirmed importance and date/time of follow-up visits scheduled: yes   Confirmed with patient if condition begins to worsen call PCP or go to the ER: yes  Hospital follow up appointment scheduled with Dr. Laury Axon on 09/21/14 @ 11:30 am.

## 2014-09-15 LAB — CULTURE, BLOOD (ROUTINE X 2)
CULTURE: NO GROWTH
CULTURE: NO GROWTH

## 2014-09-21 ENCOUNTER — Encounter: Payer: Self-pay | Admitting: Family Medicine

## 2014-09-21 ENCOUNTER — Ambulatory Visit (INDEPENDENT_AMBULATORY_CARE_PROVIDER_SITE_OTHER): Payer: Medicare Other | Admitting: Family Medicine

## 2014-09-21 VITALS — BP 120/64 | HR 84 | Temp 97.7°F

## 2014-09-21 DIAGNOSIS — R609 Edema, unspecified: Secondary | ICD-10-CM

## 2014-09-21 DIAGNOSIS — R001 Bradycardia, unspecified: Secondary | ICD-10-CM

## 2014-09-21 DIAGNOSIS — I5043 Acute on chronic combined systolic (congestive) and diastolic (congestive) heart failure: Secondary | ICD-10-CM

## 2014-09-21 DIAGNOSIS — M150 Primary generalized (osteo)arthritis: Secondary | ICD-10-CM

## 2014-09-21 DIAGNOSIS — I10 Essential (primary) hypertension: Secondary | ICD-10-CM

## 2014-09-21 DIAGNOSIS — R413 Other amnesia: Secondary | ICD-10-CM

## 2014-09-21 DIAGNOSIS — M159 Polyosteoarthritis, unspecified: Secondary | ICD-10-CM

## 2014-09-21 NOTE — Patient Instructions (Signed)

## 2014-09-21 NOTE — Assessment & Plan Note (Signed)
Improved with inc lasix

## 2014-09-21 NOTE — Progress Notes (Signed)
Subjective:    Patient ID: [name], female    DOB: [birth year], 80 y.o.   MRN: [id number]  HPI Pt here with her son and caretaker for hospital f/u -- pt was admitted after episode of acute delirium and slurred speech.  She was found to be bradycardic.  Coreg was stopped and hr and htn improved.  .  She saw cardiology on Thursday and will f/u with them in January.  Past Medical History  Diagnosis Date  . Hypertension   . Osteoporosis   . Overactive bladder   . Chronic systolic heart failure 05/20/2013    a. Echo (8/14):  mild LVH, EF 35%, diff HK, mild MR  . Atrial fibrillation     not a coumadin candidate  . Dementia   . Arthritis   . CAD (coronary artery disease)     a. LHC (8/14):  prox to mid LAD 80, pRCA 30, pPDA 30-40, EF 50%  . Hyponatremia   . NICM (nonischemic cardiomyopathy)    History   Social History  . Marital Status: Married    Spouse Name: N/A    Number of Children: N/A  . Years of Education: N/A   Occupational History  . retired     Engineer, civil (consulting)  . volunteers at hospice    Social History Main Topics  . Smoking status: Never Smoker   . Smokeless tobacco: Never Used  . Alcohol Use: No  . Drug Use: No  . Sexual Activity: No   Other Topics Concern  . Not on file   Social History Narrative   Pt lives at home with around the clock care   Current Outpatient Prescriptions  Medication Sig Dispense Refill  . acetaminophen (TYLENOL) 500 MG tablet Take 250 mg by mouth every 6 (six) hours as needed for moderate pain.    Marland Kitchen albuterol (PROAIR HFA) 108 (90 BASE) MCG/ACT inhaler Inhale 2 puffs into the lungs every 6 (six) hours as needed for wheezing or shortness of breath. 1 Inhaler 5  . aspirin EC 81 MG EC tablet Take 1 tablet (81 mg total) by mouth daily. 30 tablet 0  . atorvastatin (LIPITOR) 20 MG tablet Take 20 mg by mouth daily.    . diclofenac sodium (VOLTAREN) 1 % GEL Apply 2 g topically 4 (four) times daily. 100 g 3  . DIGOX 125 MCG tablet TAKE 1 TABLET BY  MOUTH DAILY 30 tablet 0  . donepezil (ARICEPT) 5 MG tablet Take 5 mg by mouth at bedtime.    . furosemide (LASIX) 40 MG tablet Take 80 mg (2 tablets) in the am and 40 mg (1 tablet) in the afternoon 90 tablet 3  . oxybutynin (DITROPAN-XL) 5 MG 24 hr tablet TAKE 1 TABLET BY MOUTH EVERY DAY 30 tablet 11  . polyethylene glycol powder (MIRALAX) powder Take 17 g by mouth daily.     . QUEtiapine (SEROQUEL) 100 MG tablet TAKE 1 TABLET BY MOUTH AT BEDTIME 30 tablet 11   No current facility-administered medications for this visit.   Family History  Problem Relation Age of Onset  . Depression    . Heart attack    . Coronary artery disease    . Coronary artery disease Mother 32  . Heart attack Son 51     Review of Systems As above    Objective:   Physical Exam  BP 120/64 mmHg  Pulse 84  Temp(Src) 97.7 F (36.5 C) (Oral)  Wt   SpO2 95% General appearance:  alert, cooperative, appears stated age and no distress Lungs: clear to auscultation bilaterally Heart: irreg, irreg Extremities: edema improved-- compression stockings on      Assessment & Plan:  1. Acute on chronic combined systolic and diastolic CHF, NYHA class 2 Per cardioogy Doing better on inc lasix  2. Bradycardia- Coreg stopped Dec 2015 Resolved off coreg  3. EDEMA, ANKLES Improved with lasix and compression hose  4. Essential hypertension Stable, con' t meds minus coreg  5. Primary osteoarthritis involving multiple joints voltaren gel working well

## 2014-09-21 NOTE — Assessment & Plan Note (Signed)
Not a candidate for meds

## 2014-09-21 NOTE — Assessment & Plan Note (Signed)
Good off coreg con't other meds

## 2014-09-21 NOTE — Assessment & Plan Note (Signed)
Resolved with d/c coreg con't to monitor

## 2014-09-21 NOTE — Assessment & Plan Note (Signed)
voltaren gel helping per caretaker

## 2014-09-21 NOTE — Assessment & Plan Note (Signed)
Lasix inc by cardiology F/u in January

## 2014-09-22 ENCOUNTER — Other Ambulatory Visit: Payer: Self-pay | Admitting: Cardiovascular Disease

## 2014-10-07 ENCOUNTER — Telehealth: Payer: Self-pay | Admitting: Family Medicine

## 2014-10-07 NOTE — Telephone Encounter (Signed)
Caller name: brittany from [facility] Relation to pt: Call back number: [phone] Pharmacy:  Reason for call:   Requesting a refill of aricept for patient

## 2014-10-08 MED ORDER — DONEPEZIL HCL 5 MG PO TABS: 5.0000 mg | ORAL_TABLET | Freq: Every day | ORAL | Status: DC

## 2014-10-08 NOTE — Telephone Encounter (Signed)
Rx faxed.    KP 

## 2014-10-14 ENCOUNTER — Encounter: Payer: Self-pay | Admitting: Cardiovascular Disease

## 2014-10-14 ENCOUNTER — Ambulatory Visit (INDEPENDENT_AMBULATORY_CARE_PROVIDER_SITE_OTHER): Payer: Medicare Other | Admitting: Cardiovascular Disease

## 2014-10-14 VITALS — BP 110/70 | HR 65

## 2014-10-14 DIAGNOSIS — I2510 Atherosclerotic heart disease of native coronary artery without angina pectoris: Secondary | ICD-10-CM

## 2014-10-14 DIAGNOSIS — I429 Cardiomyopathy, unspecified: Secondary | ICD-10-CM

## 2014-10-14 DIAGNOSIS — I4819 Other persistent atrial fibrillation: Secondary | ICD-10-CM

## 2014-10-14 DIAGNOSIS — I428 Other cardiomyopathies: Secondary | ICD-10-CM

## 2014-10-14 DIAGNOSIS — I481 Persistent atrial fibrillation: Secondary | ICD-10-CM

## 2014-10-14 DIAGNOSIS — I5022 Chronic systolic (congestive) heart failure: Secondary | ICD-10-CM

## 2014-10-14 NOTE — Progress Notes (Signed)
History of Present Illness: 81 yo female with history of HTN, atrial fibrillation, dementia, CAD, non-ischemic cardiomyopathy here today for follow up. She was admitted to [facility] 05/20/13 with volume overload, atrial fibrillation with RVR. Echo 05/21/13 with dilated LV cavity, LVEF 35%, mild MR. Cardiac cath 05/22/13 per Dr. [place] with 80% proximal LAD stenosis, 30% RCA stenosis. Medical management was recommended. She was diuresed with IV Lasix and was rate controlled with low dose Coreg and Digoxin. She was not felt to be a good candidate for long term anti-coagulation given advanced age and dementia. She did develop pulmonary edema at home which improved with higher dose of Lasix. Admitted to [facility] 09/09/14 with bradycardia and mental status changes. No evidence of TIA or CVA. Her Coreg was stopped and her HR improved. Seen in our office 09/11/14 by Corine Shelter, PA-C and was felt to be doing well.   She is here today for follow up. She is in a wheelchair. She is with her son and caregiver. No chest pain or SOB. Feels well. No complaints. They cannot weight her at home. No LE edema.    Primary Care Physician: Laury Axon  Last Lipid Profile:Lipid Panel     Component Value Date/Time   CHOL 126 05/23/2013 0500   TRIG 57 05/23/2013 0500   HDL 57 05/23/2013 0500   CHOLHDL 2.2 05/23/2013 0500   VLDL 11 05/23/2013 0500   LDLCALC 58 05/23/2013 0500    Past Medical History  Diagnosis Date  . Hypertension   . Osteoporosis   . Overactive bladder   . Chronic systolic heart failure 05/20/2013    a. Echo (8/14):  mild LVH, EF 35%, diff HK, mild MR  . Atrial fibrillation     not a coumadin candidate  . Dementia   . Arthritis   . CAD (coronary artery disease)     a. LHC (8/14):  prox to mid LAD 80, pRCA 30, pPDA 30-40, EF 50%  . Hyponatremia   . NICM (nonischemic cardiomyopathy)     Past Surgical History  Procedure Laterality Date  . Abdominal hysterectomy      TAH BSO  . Spine surgery  1980   Lumbar surgery  . Left heart catheterization with coronary angiogram N/A 05/22/2013    Procedure: LEFT HEART CATHETERIZATION WITH CORONARY ANGIOGRAM;  Surgeon: Tonny Bollman, MD;  Location: [facility] LAB;  Service: Cardiovascular;  Laterality: N/A;    Current Outpatient Prescriptions  Medication Sig Dispense Refill  . acetaminophen (TYLENOL) 500 MG tablet Take 250 mg by mouth every 6 (six) hours as needed for moderate pain.    Marland Kitchen albuterol (PROAIR HFA) 108 (90 BASE) MCG/ACT inhaler Inhale 2 puffs into the lungs every 6 (six) hours as needed for wheezing or shortness of breath. 1 Inhaler 5  . aspirin EC 81 MG EC tablet Take 1 tablet (81 mg total) by mouth daily. 30 tablet 0  . atorvastatin (LIPITOR) 20 MG tablet TAKE 1 TABLET BY MOUTH DAILY AT 6PM 30 tablet 0  . carvedilol (COREG) 3.125 MG tablet TAKE 1 TABLET BY MOUTH TWICE DAILY WITH MEALS 60 tablet 0  . diclofenac sodium (VOLTAREN) 1 % GEL Apply 2 g topically 4 (four) times daily. 100 g 3  . DIGOX 125 MCG tablet TAKE 1 TABLET BY MOUTH DAILY 30 tablet 0  . donepezil (ARICEPT) 5 MG tablet Take 1 tablet (5 mg total) by mouth at bedtime. 30 tablet 11  . furosemide (LASIX) 40 MG tablet Take 80 mg (  2 tablets) in the am and 40 mg (1 tablet) in the afternoon 90 tablet 3  . oxybutynin (DITROPAN-XL) 5 MG 24 hr tablet TAKE 1 TABLET BY MOUTH EVERY DAY 30 tablet 11  . polyethylene glycol powder (MIRALAX) powder Take 17 g by mouth daily.     . QUEtiapine (SEROQUEL) 100 MG tablet TAKE 1 TABLET BY MOUTH AT BEDTIME 30 tablet 11   No current facility-administered medications for this visit.    No Known Allergies  History   Social History  . Marital Status: Married    Spouse Name: N/A    Number of Children: N/A  . Years of Education: N/A   Occupational History  . retired     Engineer, civil (consulting)  . volunteers at hospice    Social History Main Topics  . Smoking status: Never Smoker   . Smokeless tobacco: Never Used  . Alcohol Use: No  . Drug Use: No  . Sexual  Activity: No   Other Topics Concern  . Not on file   Social History Narrative   Pt lives at home with around the clock care    Family History  Problem Relation Age of Onset  . Depression    . Heart attack    . Coronary artery disease    . Coronary artery disease Mother 75  . Heart attack Son 29    Review of Systems:  As stated in the HPI and otherwise negative.   BP 110/70 mmHg  Pulse 65  SpO2 98%  Physical Examination: General: Well developed, well nourished, NAD HEENT: OP clear, mucus membranes moist SKIN: warm, dry. No rashes. Neuro: No focal deficits Musculoskeletal: Muscle strength 5/5 all ext Psychiatric: Mood and affect normal Neck: No JVD, no carotid bruits, no thyromegaly, no lymphadenopathy. Lungs:Clear on left with scattered wheezes right lung. No rhonci, crackles Cardiovascular: Irregular irregular. No murmurs, gallops or rubs. Abdomen:Soft. Bowel sounds present. Non-tender.  Extremities: No lower extremity edema. Pulses are 2 + in the bilateral DP/PT.  Echo 05/21/13:  Left ventricle: The cavity size was mildly dilated. Wall thickness was increased in a pattern of mild LVH. The estimated ejection fraction was 35%. Diffuse hypokinesis. - Mitral valve: Mild regurgitation. - Atrial septum: No defect or patent foramen ovale was Identified.   Cardiac cath 05/22/13:  Left mainstem: Normal  Left anterior descending (LAD): The LAD is moderately calcified. There is an 80% stenosis in the proximal to mid vessel. The distal LAD appears relatively small.  There is a large ramus intermediate branch that trifurcates on the lateral wall. It is normal.  Left circumflex (LCx): The left circumflex is a tiny vessel that terminates early in the AV groove.  Right coronary artery (RCA): The RCA is a large dominant vessel. It gives rise to 3 posterolateral branches and the PDA. There is 30% disease in the proximal RCA. There is 30-40% disease in the proximal PDA.  Left  ventriculography: Left ventricular systolic function is abnormal, LVEF is estimated at 50%, there is mild global hypokinesis.There is no significant mitral regurgitation  Final Conclusions:  1. Single vessel obstructive CAD involving the LAD.  2. Low normal LV systolic function. EF 50%.  3. Low LV filling pressures.   Assessment and Plan:   1. CAD: Stable. No angina. Continue current meds.   2. Atrial fibrillation: Rate controlled on digoxin. Coreg stopped due to bradycardia in December 2015. She is not on anti-coagulation after long discussions regarding this with pt and family. Risk of anti-coagulation felt  to outweigh potential benefit given advanced age and dementia.   3. Chronic systolic CHF: LVEF is 35%. Will continue Lasix 80 mg am and 40 mg po BID. 1.5 liter fluid restriction.  4. Non-ischemic Cardiomyopathy: Medical management. Not ICD candidate given advanced age and dementia.

## 2014-10-14 NOTE — Patient Instructions (Signed)
Your physician wants you to follow-up in:  6 months. You will receive a reminder letter in the mail two months in advance. If you don't receive a letter, please call our office to schedule the follow-up appointment.   

## 2014-10-16 ENCOUNTER — Telehealth: Payer: Self-pay | Admitting: Cardiovascular Disease

## 2014-10-16 NOTE — Telephone Encounter (Deleted)
Error

## 2014-10-19 ENCOUNTER — Telehealth: Payer: Self-pay | Admitting: *Deleted

## 2014-10-19 NOTE — Telephone Encounter (Signed)
Could you please clarify how this patient is to be taking the furosemide? The med list has 80mg  in the a.m and 40mg  in the p.m, but Dr Gibson Ramp dictation states that the patient is to continue 80mg  in the a.m and 40mg  bid. Please advise. Thanks, MI

## 2014-10-20 NOTE — Telephone Encounter (Signed)
Furosemide dose is 80 mg in the AM and 40 mg in the PM

## 2014-10-20 NOTE — Telephone Encounter (Signed)
Patients son, [name] is aware of what current dose should be.

## 2014-10-25 ENCOUNTER — Other Ambulatory Visit: Payer: Self-pay | Admitting: Cardiovascular Disease

## 2014-11-18 ENCOUNTER — Other Ambulatory Visit: Payer: Self-pay | Admitting: Cardiovascular Disease

## 2014-11-19 ENCOUNTER — Ambulatory Visit: Payer: Medicare Other | Admitting: Cardiovascular Disease

## 2014-11-30 ENCOUNTER — Other Ambulatory Visit: Payer: Self-pay

## 2014-11-30 MED ORDER — OXYBUTYNIN CHLORIDE ER 5 MG PO TB24: 5.0000 mg | ORAL_TABLET | Freq: Every day | ORAL | Status: DC

## 2014-12-28 ENCOUNTER — Telehealth: Payer: Self-pay | Admitting: *Deleted

## 2014-12-28 NOTE — Telephone Encounter (Signed)
Prior authorization for Voltaren gel initiated. Awaiting determination. JG//CMA

## 2015-01-15 ENCOUNTER — Telehealth: Payer: Self-pay | Admitting: Family Medicine

## 2015-01-15 MED ORDER — CLONAZEPAM 0.5 MG PO TABS: ORAL_TABLET | ORAL | Status: DC

## 2015-01-15 NOTE — Telephone Encounter (Signed)
I would be careful with seroquel doses in a patient with dementia.  Recommend Klonopin 0.5 mg up to TID for agitation or sleep.  Short Rx sent to pharmacy.  They need to see Lowne next week.  If anything worsens, they are to take her to ER.

## 2015-01-15 NOTE — Telephone Encounter (Signed)
Caller name: [name]  Relation to [id number] Call back number: [phone] [facility]acy: [facility]  Reason for call: Pt's daughter came in office and brought in Power of [place] where it is stating [name] and [name]  For [name], left copy at front desk. Pt's daughter state that mother is not sleeping for like 7 days and is requesting the below as her brother [name] did this morning, she also states mother is on rx seroquel 100 mg and is wanting to see if she can have a higher dose since mother is not sleeping at all.  Please advise urgent.

## 2015-01-15 NOTE — Telephone Encounter (Signed)
Copy of POA is at front desk ( to be scanned) and they are requesting mother's Seroquel dosage be increased.   Please advise.

## 2015-01-15 NOTE — Telephone Encounter (Signed)
Rx faxed and daughter notified.

## 2015-01-15 NOTE — Telephone Encounter (Signed)
Caller name:  [name] Relation to pt: son Call back number: [phone] Pharmacy: [facility] [place]  Reason for call:   Patient son states that patient has not slept for the past 10 days. [name] is thinking that patient seroquel dosage needs to be adjusted

## 2015-01-18 NOTE — Telephone Encounter (Signed)
Called [facility] pharmacist who stated it was ready for pickup.  Notified son, [name], who stated understanding.

## 2015-01-18 NOTE — Telephone Encounter (Signed)
Patient son states that pharmacy did not receive this. Please resend.

## 2015-01-18 NOTE — Telephone Encounter (Signed)
PA approved effective through 12/28/2015. JG//CMA

## 2015-01-19 ENCOUNTER — Other Ambulatory Visit: Payer: Self-pay | Admitting: Family Medicine

## 2015-01-19 ENCOUNTER — Other Ambulatory Visit: Payer: Self-pay | Admitting: Cardiovascular Disease

## 2015-01-21 ENCOUNTER — Encounter: Payer: Self-pay | Admitting: Family Medicine

## 2015-01-21 ENCOUNTER — Ambulatory Visit (HOSPITAL_BASED_OUTPATIENT_CLINIC_OR_DEPARTMENT_OTHER)
Admission: RE | Admit: 2015-01-21 | Discharge: 2015-01-21 | Disposition: A | Discharge status: Home / Self Care | Payer: Medicare Other | Source: Ambulatory Visit | Attending: Family Medicine | Admitting: Family Medicine

## 2015-01-21 ENCOUNTER — Ambulatory Visit (INDEPENDENT_AMBULATORY_CARE_PROVIDER_SITE_OTHER): Payer: Medicare Other | Admitting: Family Medicine

## 2015-01-21 VITALS — BP 130/80 | HR 77 | Temp 98.2°F | Resp 20 | Ht 67.0 in | Wt 139.0 lb

## 2015-01-21 DIAGNOSIS — J208 Acute bronchitis due to other specified organisms: Secondary | ICD-10-CM

## 2015-01-21 DIAGNOSIS — J9811 Atelectasis: Secondary | ICD-10-CM | POA: Diagnosis not present

## 2015-01-21 DIAGNOSIS — I517 Cardiomegaly: Secondary | ICD-10-CM | POA: Diagnosis not present

## 2015-01-21 DIAGNOSIS — R05 Cough: Secondary | ICD-10-CM | POA: Insufficient documentation

## 2015-01-21 MED ORDER — AMOXICILLIN-POT CLAVULANATE 875-125 MG PO TABS: 1.0000 | ORAL_TABLET | Freq: Two times a day (BID) | ORAL | Status: DC

## 2015-01-21 NOTE — Progress Notes (Signed)
Subjective:    Patient ID: [name], female    DOB: 01/18/1928, 81 y.o.   MRN: [id number]  HPI  Patient here with care taker and son c/o cough x several days.  Wet cough. No fever.  + mucinex with no relief..    Past Medical History  Diagnosis Date  . Hypertension   . Osteoporosis   . Overactive bladder   . Chronic systolic heart failure 05/20/2013    a. Echo (8/14):  mild LVH, EF 35%, diff HK, mild MR  . Atrial fibrillation     not a coumadin candidate  . Dementia   . Arthritis   . CAD (coronary artery disease)     a. LHC (8/14):  prox to mid LAD 80, pRCA 30, pPDA 30-40, EF 50%  . Hyponatremia   . NICM (nonischemic cardiomyopathy)     Review of Systems  Constitutional: Positive for chills. Negative for fever.  HENT: Positive for congestion. Negative for postnasal drip, rhinorrhea and sinus pressure.   Respiratory: Positive for cough and shortness of breath. Negative for chest tightness, wheezing and stridor.   Cardiovascular: Negative for chest pain, palpitations and leg swelling.  Allergic/Immunologic: Negative for environmental allergies and food allergies.  Psychiatric/Behavioral: Positive for confusion, sleep disturbance and agitation. Negative for hallucinations. The patient is nervous/anxious.     Current Outpatient Prescriptions on File Prior to Visit  Medication Sig Dispense Refill  . acetaminophen (TYLENOL) 500 MG tablet Take 250 mg by mouth every 6 (six) hours as needed for moderate pain.    Marland Kitchen albuterol (PROAIR HFA) 108 (90 BASE) MCG/ACT inhaler Inhale 2 puffs into the lungs every 6 (six) hours as needed for wheezing or shortness of breath. 1 Inhaler 5  . aspirin EC 81 MG EC tablet Take 1 tablet (81 mg total) by mouth daily. 30 tablet 0  . atorvastatin (LIPITOR) 20 MG tablet TAKE 1 TABLET BY MOUTH DAILY AT 6PM 30 tablet 0  . clonazePAM (KLONOPIN) 0.5 MG tablet 1 tablet by mouth up to TID for anxiety, agitation or sleep. 30 tablet 1  . diclofenac sodium  (VOLTAREN) 1 % GEL Apply 2 g topically 4 (four) times daily. 100 g 3  . DIGOX 125 MCG tablet TAKE 1 TABLET BY MOUTH EVERY DAY 30 tablet 5  . donepezil (ARICEPT) 5 MG tablet Take 1 tablet (5 mg total) by mouth at bedtime. 30 tablet 11  . furosemide (LASIX) 40 MG tablet TAKE 2 TABLETS BY MOUTH EVERY AM AND TAKE 1 TABLET EVERY AFTERNOON 90 tablet 2  . oxybutynin (DITROPAN-XL) 5 MG 24 hr tablet Take 1 tablet (5 mg total) by mouth daily. 30 tablet 11  . polyethylene glycol powder (MIRALAX) powder Take 17 g by mouth daily.     . QUEtiapine (SEROQUEL) 100 MG tablet TAKE 1 TABLET BY MOUTH EVERY NIGHT AT BEDTIME 30 tablet 5   No current facility-administered medications on file prior to visit.       Objective:    Physical Exam  Constitutional: She appears well-developed and well-nourished. No distress.  HENT:  Head: Normocephalic and atraumatic.  TMs normal bilaterally Mild nasal congestion Throat w/out erythema, edema, or exudate  Eyes: Conjunctivae and EOM are normal. Pupils are equal, round, and reactive to light.  Neck: Normal range of motion. Neck supple.  Cardiovascular: Normal rate, regular rhythm, normal heart sounds and intact distal pulses.   No murmur heard. Pulmonary/Chest: Effort normal. No respiratory distress. She has no wheezes. She has rales.  +  hacking cough  Lymphadenopathy:    She has no cervical adenopathy.  Neurological: She is disoriented.  Demented-- no change Severe anxiety    BP 130/80 mmHg  Pulse 77  Temp(Src) 98.2 F (36.8 C) (Oral)  Resp 20  Ht 5\' 7"  (1.702 m)  Wt 139 lb (63.05 kg)  BMI 21.77 kg/m2  SpO2 97% Wt Readings from Last 3 Encounters:  01/21/15 139 lb (63.05 kg)  09/11/14 140 lb (63.504 kg)  09/09/14 140 lb 14.4 oz (63.912 kg)     Lab Results  Component Value Date   WBC 8.2 09/10/2014   HGB 12.1 09/10/2014   HCT 36.4 09/10/2014   PLT 259 09/10/2014   GLUCOSE 88 09/10/2014   CHOL 126 05/23/2013   TRIG 57 05/23/2013   HDL 57  05/23/2013   LDLDIRECT 91.4 06/29/2011   LDLCALC 58 05/23/2013   ALT 16 09/10/2014   AST 15 09/10/2014   NA 137 09/10/2014   K 4.1 09/10/2014   CL 101 09/10/2014   CREATININE 0.86 09/10/2014   BUN 20 09/10/2014   CO2 25 09/10/2014   TSH 1.013 05/20/2013   INR 1.03 01/20/2013       Assessment & Plan:   Problem List Items Addressed This Visit    None    Visit Diagnoses    Acute bronchitis due to other specified organisms    -  Primary    Relevant Medications    amoxicillin-clavulanate (AUGMENTIN) 875-125 MG per tablet    Other Relevant Orders    DG Chest 2 View (Completed)       I am having [name] start on amoxicillin-clavulanate. I am also having her maintain her polyethylene glycol powder, aspirin, albuterol, acetaminophen, diclofenac sodium, atorvastatin, donepezil, DIGOX, oxybutynin, clonazePAM, QUEtiapine, and furosemide.  Meds ordered this encounter  Medications  . amoxicillin-clavulanate (AUGMENTIN) 875-125 MG per tablet    Sig: Take 1 tablet by mouth 2 (two) times daily.    Dispense:  20 tablet    Refill:  0     Deborah Freud, DO

## 2015-01-21 NOTE — Patient Instructions (Signed)
Klonopin 0.5 mg --- try 1/2 tab if she wakes up and if it does not work you can give her 1 tab the next night  Get a cxr today Take antibiotics as instructed F/u if no better in 3-4 days

## 2015-01-21 NOTE — Progress Notes (Signed)
Pre visit review using our clinic review tool, if applicable. No additional management support is needed unless otherwise documented below in the visit note. 

## 2015-01-28 IMAGING — CR DG CHEST 1V PORT
1 series · 1 of 1 positions shown · non-contrast
Comparison: 05/20/2013

CLINICAL DATA: Congestive heart failure

PORTABLE CHEST - 1 VIEW

[AP]
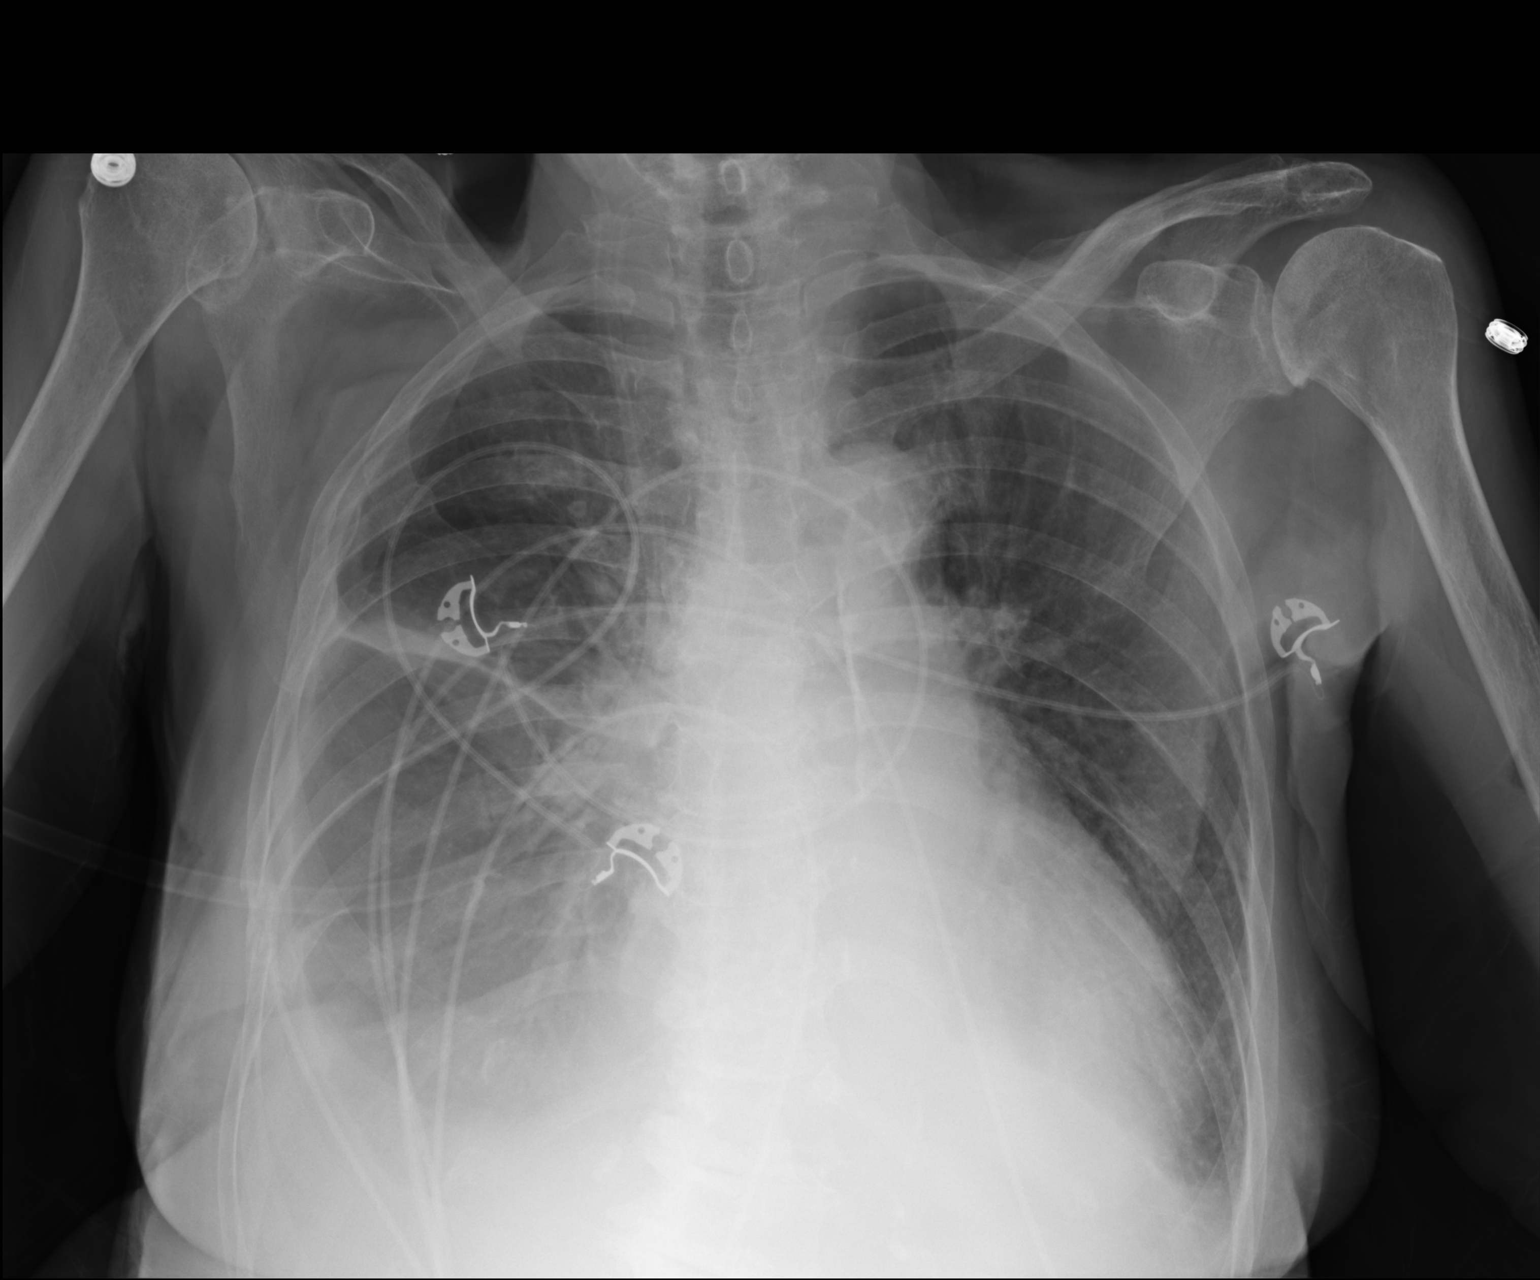

[1 of 1 positions shown; findings below may reference images not displayed]

FINDINGS: Cardiac enlargement.  Pulmonary vascular congestion with
mild edema.  Progression of right pleural effusion.  Small left
effusion unchanged.

Bibasilar atelectasis has progressed. Subacute and chronic right
rib fractures noted.
IMPRESSION: Progression of congestive heart failure.  Progression of right
pleural effusion.

## 2015-01-30 IMAGING — CR DG CHEST 2V
2 series · 2 of 2 positions shown · non-contrast
Comparison: 05/21/2013 and 05/20/2013

CLINICAL DATA: Bilateral pleural effusions.

CHEST - 2 VIEW

[w chest lat]
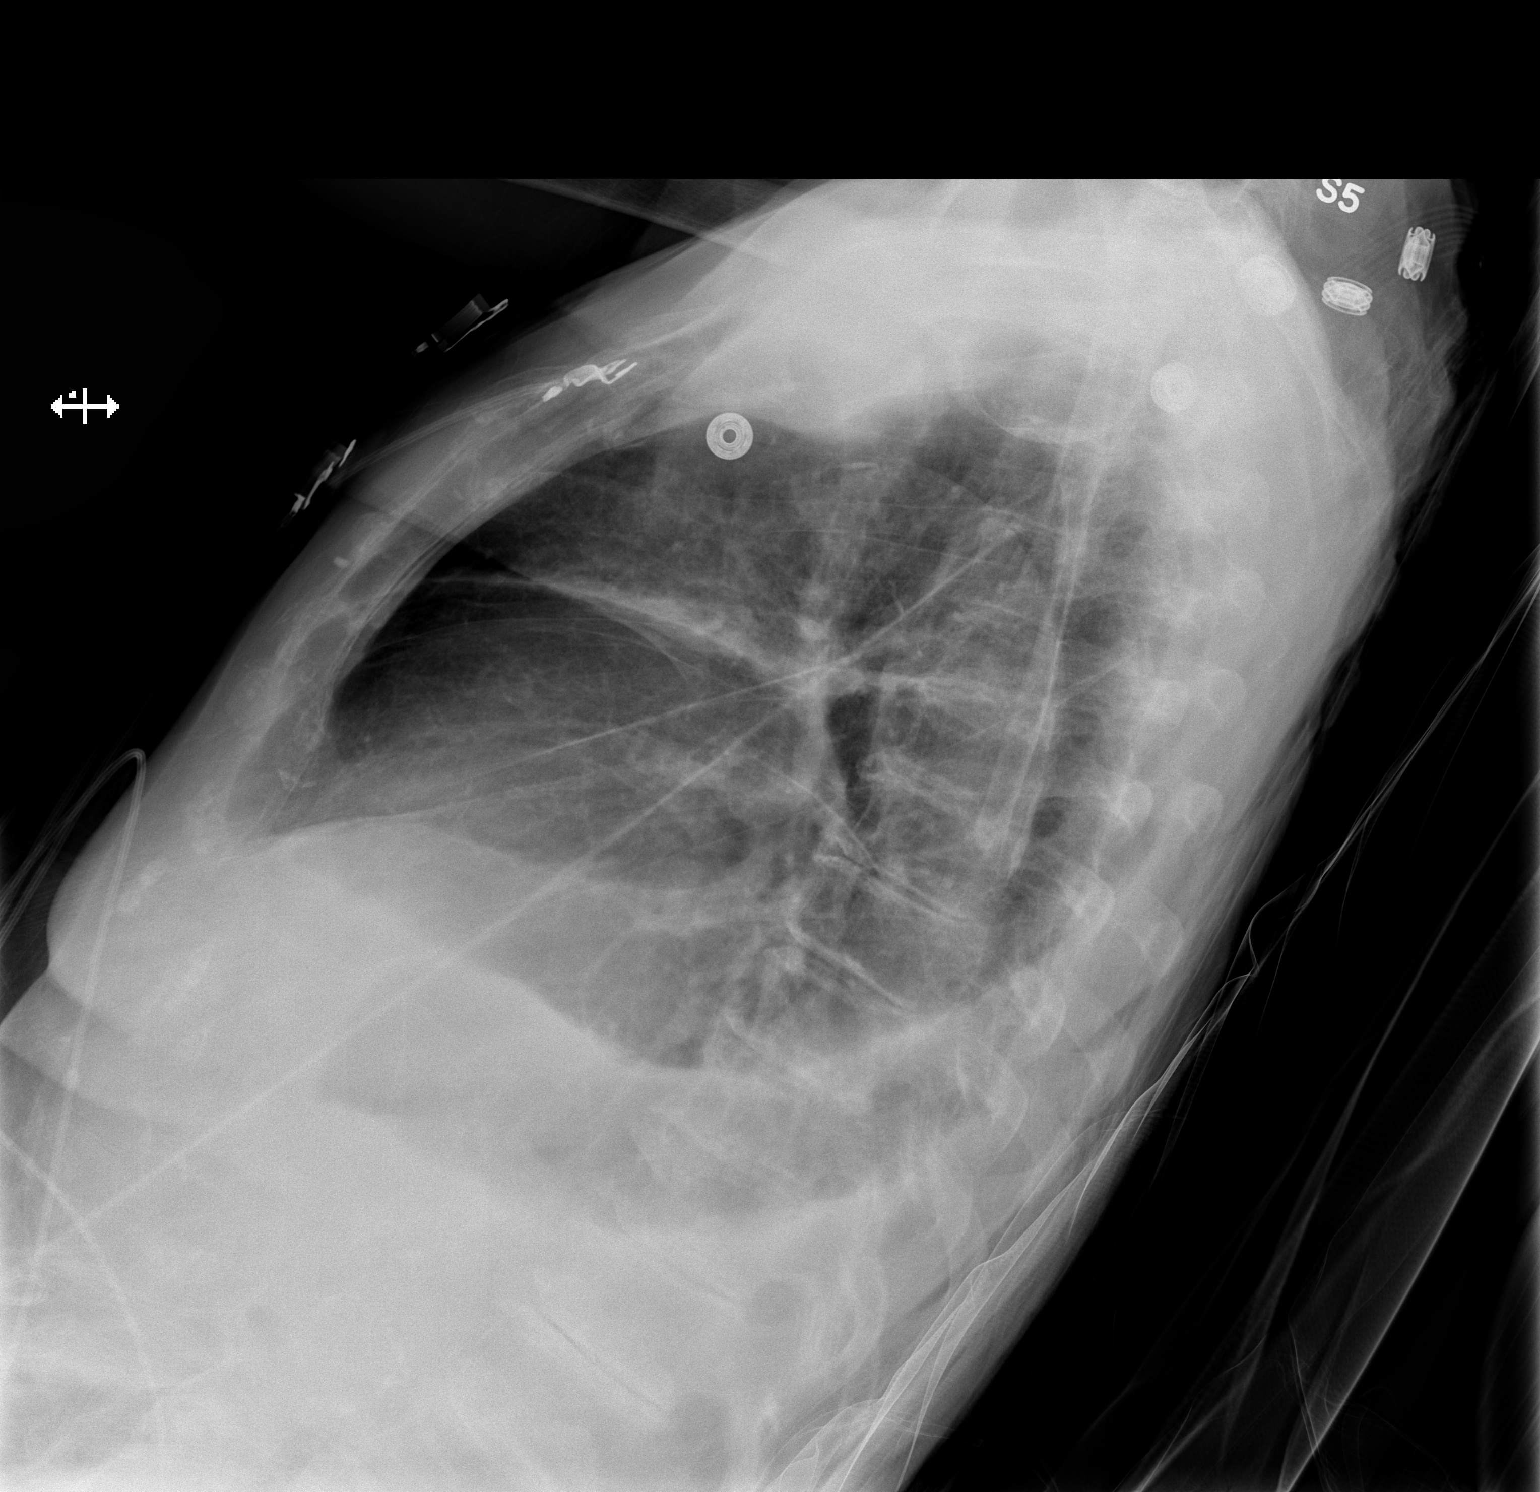

[x chest ap]
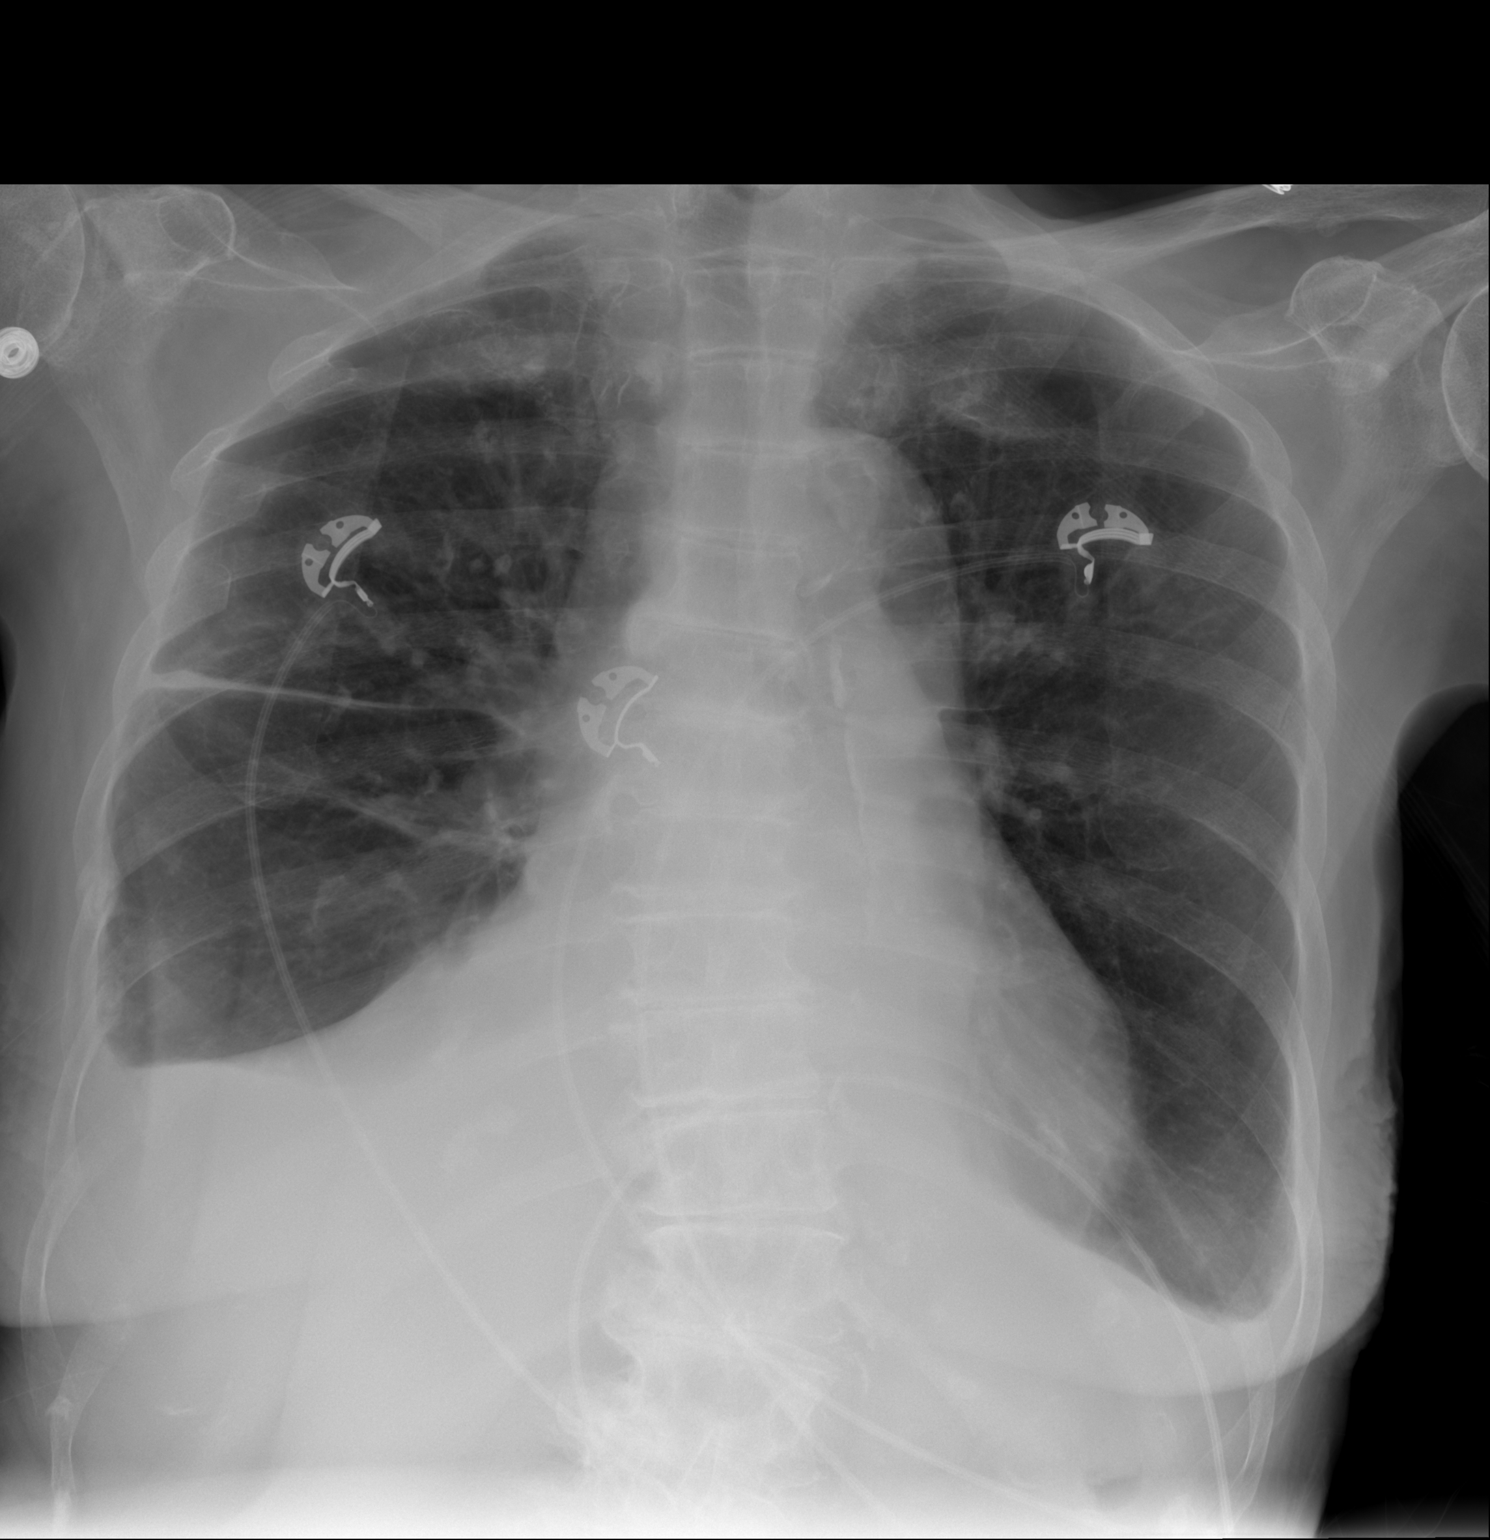

[2 of 2 positions shown; findings below may reference images not displayed]

FINDINGS: Two views of the chest demonstrate bilateral pleural
effusions, right side greater than left.  Right pleural effusion is
small-moderate in size.  Heart size is stable and within normal
limits.  Thoracic aorta is heavily calcified.  There is fluid
within the right minor fissure.  Probable atelectasis or
consolidation along the medial right lung base.  Few linear
densities in the right lower lung are suggestive for atelectasis.
There are old right rib fractures.  No evidence for a pneumothorax.
IMPRESSION: Bilateral pleural effusions, right side greater than left. The
pleural effusions have minimally changed since 05/20/2013.

## 2015-02-18 ENCOUNTER — Other Ambulatory Visit: Payer: Self-pay | Admitting: Family Medicine

## 2015-03-10 ENCOUNTER — Telehealth: Payer: Self-pay | Admitting: Cardiovascular Disease

## 2015-03-10 DIAGNOSIS — R0609 Other forms of dyspnea: Principal | ICD-10-CM

## 2015-03-10 DIAGNOSIS — I5043 Acute on chronic combined systolic (congestive) and diastolic (congestive) heart failure: Secondary | ICD-10-CM

## 2015-03-10 NOTE — Telephone Encounter (Signed)
Son calling to report that patient has been experiencing increased SOB (constant and moderate) x 2 days. Patient is wheelchair bound and mostly sits in recliner, so most of the time her swelling is in her mid-section. They are unable to weigh her accurately, however they note that it "appears" that she has some swelling to her hips/abdomen area and "puffy eyes". They are unaware of any other symptoms. Patient also has dementia, therefore a unreliable historian. Based on patient having a couple of new caregivers in the home, son is unsure of compliance to her 1.5 l fluid restriction and low Na+ diet. He will follow up with the caregivers on that part of her home instructions. Patient's daughter has added an extra dose of Lasix 40 mg in the evenings x 2 days. The patient is urinating (per son's report).   Information reviewed by Dr. Graciela Husbands. Patient advised to either go to PCP for evaluation to r/o fluid retention or patient can take Lasix 120 mg twice daily x 2 days only and then resume normal dosing. If this alleviates her symptoms, she should get a Dig level and BMET lab draw. If sx are not alleviated, she needs to be seen by her PCP Friday (or sooner if sx worsen) or Urgent Care.  Also patient needs to schedule her 6 month f/u appointment with Dr. Clifton James (she will be due to see him in July).  Notified son of above information. Discussed all above issues and advisements with him. He will discuss with his sister, however feels they will probably try the extra dosing of Lasix x 2 days to see if that alleviates current complaints/sx of SOB/swelling. Also advised patient that should the sx remain unchanged or worsen, please call back.

## 2015-03-10 NOTE — Telephone Encounter (Signed)
New message     Pt c/o Shortness Of Breath: STAT if SOB developed within the last 24 hours or pt is noticeably SOB on the phone  1. Are you currently SOB (can you hear that pt is SOB on the phone)?  Talking to son 2. How long have you been experiencing SOB?  Son says since monday  3. Are you SOB when sitting or when up moving around?  Sitting----pt does not walk 4. Are you currently experiencing any other symptoms?  Swelling  In abd/hips Pt was given extra lasix Monday and tues but it did not help the sob

## 2015-03-12 ENCOUNTER — Other Ambulatory Visit (INDEPENDENT_AMBULATORY_CARE_PROVIDER_SITE_OTHER): Payer: Medicare Other | Admitting: *Deleted

## 2015-03-12 ENCOUNTER — Telehealth: Payer: Self-pay | Admitting: Cardiovascular Disease

## 2015-03-12 DIAGNOSIS — I5043 Acute on chronic combined systolic (congestive) and diastolic (congestive) heart failure: Secondary | ICD-10-CM | POA: Diagnosis not present

## 2015-03-12 DIAGNOSIS — R0609 Other forms of dyspnea: Secondary | ICD-10-CM

## 2015-03-12 LAB — BASIC METABOLIC PANEL
BUN: 20 mg/dL (ref 6–23)
CO2: 30 meq/L (ref 19–32)
CREATININE: 1.1 mg/dL (ref 0.40–1.20)
Calcium: 9.2 mg/dL (ref 8.4–10.5)
Chloride: 98 mEq/L (ref 96–112)
GFR: 50.14 mL/min — ABNORMAL LOW (ref >60.00)
Glucose, Bld: 138 mg/dL — ABNORMAL HIGH (ref 70–99)
Potassium: 3.7 mEq/L (ref 3.5–5.1)
Sodium: 137 mEq/L (ref 135–145)

## 2015-03-12 NOTE — Telephone Encounter (Signed)
Pt's son calling back and needs to know if he needs to increase pt's Lasix's or stay at original dose?

## 2015-03-12 NOTE — Telephone Encounter (Signed)
Called patient's son back. According to notes from yesterday, patient is to resume normal dose of lasix. Informed patient's son that labs have not been reviewed by the doctor. Informed him that when labs result, the office will give them a call. Patient's son verbalized understanding.

## 2015-03-12 NOTE — Telephone Encounter (Signed)
Pt.s son called and informed that Dr. Eldridge Dace has to review the labs before we can give out the results.

## 2015-03-12 NOTE — Telephone Encounter (Signed)
New message      Want lab results from this am?  And, will the lasix dosage change?

## 2015-03-13 LAB — DIGOXIN LEVEL: DIGOXIN LVL: 1.2 ng/mL (ref 0.8–2.0)

## 2015-03-22 ENCOUNTER — Other Ambulatory Visit: Payer: Self-pay | Admitting: Family Medicine

## 2015-03-22 ENCOUNTER — Telehealth: Payer: Self-pay

## 2015-03-22 ENCOUNTER — Telehealth: Payer: Self-pay | Admitting: Family Medicine

## 2015-03-22 NOTE — Telephone Encounter (Signed)
Caller name: Nicholos Johns Relation to pt: Call back number: daughter Pharmacy: [facility] in [place]  Reason for call:   Requesting klonopin refill

## 2015-03-22 NOTE — Telephone Encounter (Signed)
Encounter opened in error

## 2015-03-22 NOTE — Patient Instructions (Signed)
Called left message to return call.  Message is to let her know that her labs are stable

## 2015-03-22 NOTE — Telephone Encounter (Signed)
Duplicate request. Rx faxed.     KP

## 2015-03-22 NOTE — Telephone Encounter (Signed)
Last seen 01/21/15 and filled 01/15/15 #30 with 1 refill   Please advise     KP

## 2015-03-28 ENCOUNTER — Encounter (HOSPITAL_BASED_OUTPATIENT_CLINIC_OR_DEPARTMENT_OTHER): Payer: Self-pay | Admitting: Emergency Medicine

## 2015-03-28 ENCOUNTER — Observation Stay (HOSPITAL_BASED_OUTPATIENT_CLINIC_OR_DEPARTMENT_OTHER)
Admission: EM | Admit: 2015-03-28 | Discharge: 2015-03-29 | Disposition: A | Discharge status: Home / Self Care | Payer: Medicare Other | Attending: Cardiovascular Disease | Admitting: Cardiovascular Disease

## 2015-03-28 ENCOUNTER — Emergency Department (HOSPITAL_BASED_OUTPATIENT_CLINIC_OR_DEPARTMENT_OTHER): Payer: Medicare Other

## 2015-03-28 ENCOUNTER — Telehealth: Payer: Self-pay | Admitting: Nurse Practitioner

## 2015-03-28 DIAGNOSIS — I2510 Atherosclerotic heart disease of native coronary artery without angina pectoris: Secondary | ICD-10-CM | POA: Diagnosis present

## 2015-03-28 DIAGNOSIS — F0391 Unspecified dementia with behavioral disturbance: Secondary | ICD-10-CM | POA: Diagnosis not present

## 2015-03-28 DIAGNOSIS — I509 Heart failure, unspecified: Secondary | ICD-10-CM | POA: Diagnosis not present

## 2015-03-28 DIAGNOSIS — R0602 Shortness of breath: Secondary | ICD-10-CM | POA: Diagnosis not present

## 2015-03-28 DIAGNOSIS — I5043 Acute on chronic combined systolic (congestive) and diastolic (congestive) heart failure: Secondary | ICD-10-CM | POA: Diagnosis not present

## 2015-03-28 DIAGNOSIS — I10 Essential (primary) hypertension: Secondary | ICD-10-CM | POA: Diagnosis not present

## 2015-03-28 DIAGNOSIS — I482 Chronic atrial fibrillation: Secondary | ICD-10-CM | POA: Diagnosis not present

## 2015-03-28 DIAGNOSIS — I428 Other cardiomyopathies: Secondary | ICD-10-CM

## 2015-03-28 DIAGNOSIS — I4821 Permanent atrial fibrillation: Secondary | ICD-10-CM | POA: Diagnosis present

## 2015-03-28 DIAGNOSIS — R0600 Dyspnea, unspecified: Secondary | ICD-10-CM | POA: Diagnosis present

## 2015-03-28 DIAGNOSIS — F03918 Unspecified dementia, unspecified severity, with other behavioral disturbance: Secondary | ICD-10-CM | POA: Diagnosis present

## 2015-03-28 HISTORY — DX: Permanent atrial fibrillation: I48.21

## 2015-03-28 HISTORY — DX: Chronic combined systolic (congestive) and diastolic (congestive) heart failure: I50.42

## 2015-03-28 HISTORY — DX: Chronic kidney disease, stage 3 (moderate): N18.3

## 2015-03-28 HISTORY — DX: Chronic kidney disease, stage 3 unspecified: N18.30

## 2015-03-28 LAB — CBC WITH DIFFERENTIAL/PLATELET
BASOS PCT: 1 % (ref 0–1)
Basophils Absolute: 0.1 10*3/uL (ref 0.0–0.1)
EOS ABS: 0.7 10*3/uL (ref 0.0–0.7)
Eosinophils Relative: 6 % — ABNORMAL HIGH (ref 0–5)
HEMATOCRIT: 44.1 % (ref 36.0–46.0)
HEMOGLOBIN: 14.7 g/dL (ref 12.0–15.0)
LYMPHS PCT: 14 % (ref 12–46)
Lymphs Abs: 1.7 10*3/uL (ref 0.7–4.0)
MCH: 29.3 pg (ref 26.0–34.0)
MCHC: 33.3 g/dL (ref 30.0–36.0)
MCV: 88 fL (ref 78.0–100.0)
MONO ABS: 1.4 10*3/uL — AB (ref 0.1–1.0)
MONOS PCT: 12 % (ref 3–12)
NEUTROS ABS: 8.5 10*3/uL — AB (ref 1.7–7.7)
Neutrophils Relative %: 67 % (ref 43–77)
PLATELETS: 319 10*3/uL (ref 150–400)
RBC: 5.01 MIL/uL (ref 3.87–5.11)
RDW: 15.4 % (ref 11.5–15.5)
WBC: 12.4 10*3/uL — AB (ref 4.0–10.5)

## 2015-03-28 LAB — COMPREHENSIVE METABOLIC PANEL
ALBUMIN: 3.2 g/dL — AB (ref 3.5–5.0)
ALT: 21 U/L (ref 14–54)
ANION GAP: 12 (ref 5–15)
AST: 28 U/L (ref 15–41)
Alkaline Phosphatase: 99 U/L (ref 38–126)
BUN: 20 mg/dL (ref 6–20)
CO2: 26 mmol/L (ref 22–32)
Calcium: 8.9 mg/dL (ref 8.9–10.3)
Chloride: 98 mmol/L — ABNORMAL LOW (ref 101–111)
Creatinine, Ser: 1.02 mg/dL — ABNORMAL HIGH (ref 0.44–1.00)
GFR, EST AFRICAN AMERICAN: 56 mL/min — AB (ref >60)
GFR, EST NON AFRICAN AMERICAN: 49 mL/min — AB (ref >60)
GLUCOSE: 108 mg/dL — AB (ref 65–99)
Potassium: 3.9 mmol/L (ref 3.5–5.1)
SODIUM: 136 mmol/L (ref 135–145)
TOTAL PROTEIN: 6.6 g/dL (ref 6.5–8.1)
Total Bilirubin: 0.7 mg/dL (ref 0.3–1.2)

## 2015-03-28 LAB — TROPONIN I: TROPONIN I: 0.07 ng/mL — AB (ref <0.031)

## 2015-03-28 LAB — CBC
HCT: 42 % (ref 36.0–46.0)
Hemoglobin: 13.7 g/dL (ref 12.0–15.0)
MCH: 28.7 pg (ref 26.0–34.0)
MCHC: 32.6 g/dL (ref 30.0–36.0)
MCV: 87.9 fL (ref 78.0–100.0)
PLATELETS: 285 10*3/uL (ref 150–400)
RBC: 4.78 MIL/uL (ref 3.87–5.11)
RDW: 15.1 % (ref 11.5–15.5)
WBC: 10.5 10*3/uL (ref 4.0–10.5)

## 2015-03-28 LAB — URINALYSIS, ROUTINE W REFLEX MICROSCOPIC
Bilirubin Urine: NEGATIVE
GLUCOSE, UA: NEGATIVE mg/dL
HGB URINE DIPSTICK: NEGATIVE
Ketones, ur: NEGATIVE mg/dL
LEUKOCYTES UA: NEGATIVE
Nitrite: NEGATIVE
PH: 7.5 (ref 5.0–8.0)
Protein, ur: NEGATIVE mg/dL
Specific Gravity, Urine: 1.008 (ref 1.005–1.030)
UROBILINOGEN UA: 0.2 mg/dL (ref 0.0–1.0)

## 2015-03-28 LAB — DIGOXIN LEVEL: Digoxin Level: 1.3 ng/mL (ref 0.8–2.0)

## 2015-03-28 LAB — CREATININE, SERUM
CREATININE: 1.2 mg/dL — AB (ref 0.44–1.00)
GFR calc non Af Amer: 40 mL/min — ABNORMAL LOW (ref >60)
GFR, EST AFRICAN AMERICAN: 46 mL/min — AB (ref >60)

## 2015-03-28 LAB — BRAIN NATRIURETIC PEPTIDE: B NATRIURETIC PEPTIDE 5: 569.8 pg/mL — AB (ref 0.0–100.0)

## 2015-03-28 MED ORDER — ATORVASTATIN CALCIUM 20 MG PO TABS: 20.0000 mg | ORAL_TABLET | Freq: Every day | ORAL | Status: DC

## 2015-03-28 MED ORDER — FUROSEMIDE 10 MG/ML IJ SOLN
40.0000 mg | Freq: Two times a day (BID) | INTRAMUSCULAR | Status: DC
Start: 2015-03-28 — End: 2015-03-29
  Administered 2015-03-28: 40 mg via INTRAVENOUS
  Filled 2015-03-28: qty 4

## 2015-03-28 MED ORDER — ASPIRIN EC 81 MG PO TBEC
81.0000 mg | DELAYED_RELEASE_TABLET | Freq: Every day | ORAL | Status: DC
  Administered 2015-03-29: 81 mg via ORAL
  Filled 2015-03-28 (×2): qty 1

## 2015-03-28 MED ORDER — DONEPEZIL HCL 5 MG PO TABS
5.0000 mg | ORAL_TABLET | Freq: Every day | ORAL | Status: DC
  Administered 2015-03-28: 5 mg via ORAL
  Filled 2015-03-28: qty 1

## 2015-03-28 MED ORDER — QUETIAPINE FUMARATE 50 MG PO TABS
100.0000 mg | ORAL_TABLET | Freq: Every day | ORAL | Status: DC
  Administered 2015-03-28: 100 mg via ORAL
  Filled 2015-03-28: qty 2

## 2015-03-28 MED ORDER — ACETAMINOPHEN 325 MG PO TABS: 650.0000 mg | ORAL_TABLET | ORAL | Status: DC | PRN

## 2015-03-28 MED ORDER — SODIUM CHLORIDE 0.9 % IV SOLN: 250.0000 mL | INTRAVENOUS | Status: DC | PRN

## 2015-03-28 MED ORDER — POLYETHYLENE GLYCOL 3350 17 G PO PACK
17.0000 g | PACK | Freq: Every day | ORAL | Status: DC
  Administered 2015-03-29: 17 g via ORAL
  Filled 2015-03-28: qty 1

## 2015-03-28 MED ORDER — POLYETHYLENE GLYCOL 3350 17 GM/SCOOP PO POWD
17.0000 g | Freq: Every day | ORAL | Status: DC
  Filled 2015-03-28: qty 255

## 2015-03-28 MED ORDER — CLONAZEPAM 0.5 MG PO TABS: 0.2500 mg | ORAL_TABLET | Freq: Three times a day (TID) | ORAL | Status: DC | PRN

## 2015-03-28 MED ORDER — OXYBUTYNIN CHLORIDE ER 5 MG PO TB24
5.0000 mg | ORAL_TABLET | Freq: Every day | ORAL | Status: DC
  Administered 2015-03-29: 5 mg via ORAL
  Filled 2015-03-28: qty 1

## 2015-03-28 MED ORDER — ONDANSETRON HCL 4 MG/2ML IJ SOLN: 4.0000 mg | Freq: Four times a day (QID) | INTRAMUSCULAR | Status: DC | PRN

## 2015-03-28 MED ORDER — ENOXAPARIN SODIUM 40 MG/0.4ML ~~LOC~~ SOLN
40.0000 mg | SUBCUTANEOUS | Status: DC
  Administered 2015-03-28: 40 mg via SUBCUTANEOUS
  Filled 2015-03-28: qty 0.4

## 2015-03-28 MED ORDER — SODIUM CHLORIDE 0.9 % IJ SOLN: 3.0000 mL | INTRAMUSCULAR | Status: DC | PRN

## 2015-03-28 MED ORDER — DIGOXIN 125 MCG PO TABS
125.0000 ug | ORAL_TABLET | Freq: Every day | ORAL | Status: DC
  Administered 2015-03-29: 125 ug via ORAL
  Filled 2015-03-28: qty 1

## 2015-03-28 MED ORDER — ENSURE ENLIVE PO LIQD
237.0000 mL | Freq: Two times a day (BID) | ORAL | Status: DC
  Administered 2015-03-29: 237 mL via ORAL

## 2015-03-28 MED ORDER — SODIUM CHLORIDE 0.9 % IJ SOLN: 3.0000 mL | Freq: Two times a day (BID) | INTRAMUSCULAR | Status: DC

## 2015-03-28 NOTE — ED Notes (Signed)
Per family has been SOB, also appears week with onset today.

## 2015-03-28 NOTE — ED Notes (Signed)
Pt resting quietly w/ eyes closed, family and caregiver at bedside

## 2015-03-28 NOTE — ED Notes (Signed)
Pt resting quietly, family at bedside.

## 2015-03-28 NOTE — ED Notes (Signed)
Patient transported to X-ray via stretcher, sr x 2 up 

## 2015-03-28 NOTE — Telephone Encounter (Signed)
   Pts son called to report that pts aide noted that she was more confused today and also has had zero urine output despite receiving 80 mg of lasix this am.  He has not yet seen his mother today.  I rec that pt be seen in either urgent care or ED as AMS with oliguria may represent UTI or other infection/sepsis that may result in rapid deterioration.  Caller verbalized understanding and was grateful for the call back.  Nicolasa Ducking, NP 03/28/2015, 1:40 PM

## 2015-03-28 NOTE — ED Notes (Signed)
Pt high fall risk, bed in lowest position, callbell within reach, fall risk bracelet on, door open,

## 2015-03-28 NOTE — H&P (Signed)
History and Physical   Admit date: 03/28/2015 Name:  [name] Medical record number: [id number] DOB/Age:  02/20/1928  81 y.o. female  Referring Physician:  [facility]  Primary Cardiologist:  Dr. Sanjuana Kava   Primary Physician:   Dr. Laury Axon  Chief complaint/reason for admission: Reduced urine output, shortness of breath   HPI:  This 81 year old female has severe dementia and is essentially homebound.  She is able to get out only with a wheelchair and has to pivot to stand.  She has a history of chronic atrial fibrillation and is not felt to be a candidate for anticoagulation due to her dementia.  She requires round-the-clock care at home.  She was hospitalized in 2014 with atrial fibrillation and also found to be in heart failure.  The echo showed an ejection fraction of 35%.  Catheterization reportedly low normal ejection fraction of 50% with an 80% LAD stenosis that has been treated medically.  She was hospitalized with a TIA in December of this year.  She has been at home and had taken an extra dose of Lasix recently and her caregivers noted reduced urine output and some sacral edema as well as worsening shortness of breath.  She was advised to go to the emergency room in the ER doctor felt that she should be admitted.  Essentially no history is obtainable from the patient.  Her caregivers are with her as well as her family.  She may have had some chest pain a couple of weeks ago.  She is a no CODE BLUE.  She is not acutely short of breath but according to the family may have been more short of breath and the more concerning thing is edema in her sacral area.  Again the urine output has been reduced and there is a question about more confusion.    Past Medical History  Diagnosis Date  . Hypertension   . Osteoporosis   . Overactive bladder   . Chronic systolic heart failure 05/20/2013    a. Echo (8/14):  mild LVH, EF 35%, diff HK, mild MR  . Atrial fibrillation     not a coumadin  candidate  . Dementia   . Arthritis   . CAD (coronary artery disease)     a. LHC (8/14):  prox to mid LAD 80, pRCA 30, pPDA 30-40, EF 50%  . Hyponatremia   . NICM (nonischemic cardiomyopathy)      Past Surgical History  Procedure Laterality Date  . Abdominal hysterectomy      TAH BSO  . Spine surgery  1980    Lumbar surgery  . Left heart catheterization with coronary angiogram N/A 05/22/2013    Procedure: LEFT HEART CATHETERIZATION WITH CORONARY ANGIOGRAM;  Surgeon: Tonny Bollman, MD;  Location: [facility];  Service: Cardiovascular;  Laterality: N/A;   Allergies: has No Known Allergies.   Medications: Prior to Admission medications   Medication Sig Start Date End Date Taking? Authorizing Provider  acetaminophen (TYLENOL) 500 MG tablet Take 250 mg by mouth every 6 (six) hours as needed for moderate pain.    Historical Provider, MD  amoxicillin-clavulanate (AUGMENTIN) 875-125 MG per tablet Take 1 tablet by mouth 2 (two) times daily. 01/21/15   Lelon Perla, DO  aspirin EC 81 MG EC tablet Take 1 tablet (81 mg total) by mouth daily. 05/29/13   Jeralyn Bennett, MD  atorvastatin (LIPITOR) 20 MG tablet TAKE 1 TABLET BY MOUTH DAILY AT [facility] 09/22/14   Nile Dear  McAlhany, MD  clonazePAM (KLONOPIN) 0.5 MG tablet TAKE 1 TABLET BY MOUTH THREE TIMES DAILY FOR ANXIETY, AGITATION, OR SLEEP 03/22/15   Lelon Perla, DO  diclofenac sodium (VOLTAREN) 1 % GEL Apply 2 g topically 4 (four) times daily. 08/25/14   Lelon Perla, DO  DIGOX 125 MCG tablet TAKE 1 TABLET BY MOUTH EVERY DAY 10/26/14   Kathleene Hazel, MD  donepezil (ARICEPT) 5 MG tablet Take 1 tablet (5 mg total) by mouth at bedtime. 10/08/14   Grayling Congress Lowne, DO  furosemide (LASIX) 40 MG tablet TAKE 2 TABLETS BY MOUTH EVERY AM AND TAKE 1 TABLET EVERY AFTERNOON 01/20/15   Kathleene Hazel, MD  oxybutynin (DITROPAN-XL) 5 MG 24 hr tablet Take 1 tablet (5 mg total) by mouth daily. 11/30/14   Lelon Perla, DO  polyethylene glycol  powder (MIRALAX) powder Take 17 g by mouth daily.     Historical Provider, MD  PROAIR HFA 108 (90 BASE) MCG/ACT inhaler INHALE 2 PUFFS INTO LUNGS EVERY 6 HOURS AS NEEDED FOR WHEEZING OR SHORTNESS OF BREATH 02/18/15   Lelon Perla, DO  QUEtiapine (SEROQUEL) 100 MG tablet TAKE 1 TABLET BY MOUTH EVERY NIGHT AT BEDTIME 01/19/15   Lelon Perla, DO    Family History:  No family status information on file.    Social History:   reports that she has never smoked. She has never used smokeless tobacco. She reports that she does not drink alcohol or use illicit drugs.   History   Social History Narrative   Pt lives at home with around the clock care     Review of Systems: Not able to obtain due to significant dementia.  Is very hard of hearing.  Has had reduced urine output but no recent vomiting or other GI complaints.  No skin breakdown Other than as noted above, the remainder of the review of systems is normal  Physical Exam: BP 105/58 mmHg  Pulse 70  Temp(Src) 97.5 F (36.4 C) (Oral)  Resp 16  Ht 5\' 5"  (1.651 m)  Wt 62.007 kg (136 lb 11.2 oz)  BMI 22.75 kg/m2  SpO2 98%  General appearance: Elderly pleasant woman lying quietly in bed not in acute shortness of breath and very hard of hearing Head: Normocephalic, without obvious abnormality, atraumatic Eyes: conjunctivae/corneas clear. PERRL, EOM's intact. Fundi not examined  Neck: no adenopathy, no carotid bruit, no JVD and supple, symmetrical, trachea midline Lungs: Mild crackles at bases Heart: Irregular rhythm, normal S1 and S2, no S3, no murmur Abdomen: soft, non-tender; bowel sounds normal; no masses,  no organomegaly Pelvic: deferred Extremities: Wearing support stockings, 1+ edema, 2+ sacral edema noted Pulses: 2+ and symmetric Skin: Skin color, texture, turgor normal. No rashes or lesions Neurologic: Not oriented to date but is alert and will answer questions, not able to walk  Labs: CBC  Recent Labs  03/28/15 1530   WBC 12.4*  RBC 5.01  HGB 14.7  HCT 44.1  PLT 319  MCV 88.0  MCH 29.3  MCHC 33.3  RDW 15.4  LYMPHSABS 1.7  MONOABS 1.4*  EOSABS 0.7  BASOSABS 0.1   CMP   Recent Labs  03/28/15 1530  NA 136  K 3.9  CL 98*  CO2 26  GLUCOSE 108*  BUN 20  CREATININE 1.02*  CALCIUM 8.9  PROT 6.6  ALBUMIN 3.2*  AST 28  ALT 21  ALKPHOS 99  BILITOT 0.7  GFRNONAA 49*  GFRAA 56*   BNP (last  3 results) BNP    Component Value Date/Time   BNP 569.8* 03/28/2015 1530    ProBNP    Component Value Date/Time   PROBNP 7689.0* 12/02/2013 1610     Cardiac Panel (last 3 results)  Recent Labs  03/28/15 1530  TROPONINI 0.07*   Thyroid  Lab Results  Component Value Date   TSH 1.013 05/20/2013    EKG: Atrial fibrillation with controlled response, possible old anteroseptal infarction, nonspecific ST changes laterally  Radiology: Mild cardiomegaly, mildly underinflated lungs, no acute heart failure   IMPRESSIONS: 1.  Acute on chronic heart failure-unclear whether is systolic or diastolic according to old records, 1 EF is recorded as 35% another's recorded is 50% 2.  Chronic atrial fibrillation not felt to be good candidate for anticoagulation 3.  Dementia severe requiring 24-hour care 4.  Hypertension  PLAN: IV Lasix for 2 doses and follow-up again tomorrow probably obtain echo to determine if EF is improved as was reported higher and cath and on previous echo.  Possible early discharge.  She will be a no CODE BLUE.  Signed: Darden Palmer MD [facility]  03/28/2015, 9:21 PM

## 2015-03-28 NOTE — ED Notes (Signed)
High Fall Risk measures remain in place

## 2015-03-28 NOTE — ED Notes (Signed)
Pt placed on cont cardiac monitor with cont POX reading, 12 lead EKG done

## 2015-03-28 NOTE — ED Provider Notes (Signed)
CSN: [id number]     Arrival date & time 03/28/15  1458 History   First MD Initiated Contact with Patient 03/28/15 1508     Chief Complaint  Patient presents with  . Shortness of Breath     Patient is a 81 y.o. female presenting with shortness of breath. The history is provided by the patient. No language interpreter was used.  Shortness of Breath  [name] presents for evaluation of shortness of breath. Level V cavity out due to dementia. History is provided by daughter and caregiver. Per report she has had decreased urinary output since yesterday, slightly darker urine usual. She also appears to be more short of breath with a slight cough for the last 2 days. She has a history of congestive heart failure and takes Lasix twice a day. She's had no changes to her medications. No complaints of fevers, chest pain, abdominal pain, vomiting, diarrhea, no change in chronic lower extremity edema. She does appear to have a decreased appetite recently.  Past Medical History  Diagnosis Date  . Hypertension   . Osteoporosis   . Overactive bladder   . Chronic systolic heart failure 05/20/2013    a. Echo (8/14):  mild LVH, EF 35%, diff HK, mild MR  . Atrial fibrillation     not a coumadin candidate  . Dementia   . Arthritis   . CAD (coronary artery disease)     a. LHC (8/14):  prox to mid LAD 80, pRCA 30, pPDA 30-40, EF 50%  . Hyponatremia   . NICM (nonischemic cardiomyopathy)    Past Surgical History  Procedure Laterality Date  . Abdominal hysterectomy      TAH BSO  . Spine surgery  1980    Lumbar surgery  . Left heart catheterization with coronary angiogram N/A 05/22/2013    Procedure: LEFT HEART CATHETERIZATION WITH CORONARY ANGIOGRAM;  Surgeon: Tonny Bollman, MD;  Location: [facility] CATH LAB;  Service: Cardiovascular;  Laterality: N/A;   Family History  Problem Relation Age of Onset  . Depression    . Heart attack    . Coronary artery disease    . Coronary artery disease Mother 26  .  Heart attack Son 33   History  Substance Use Topics  . Smoking status: Never Smoker   . Smokeless tobacco: Never Used  . Alcohol Use: No   OB History    No data available     Review of Systems  Unable to perform ROS Respiratory: Positive for shortness of breath.       Allergies  Review of patient's allergies indicates no known allergies.  Home Medications   Prior to Admission medications   Medication Sig Start Date End Date Taking? Authorizing Provider  acetaminophen (TYLENOL) 500 MG tablet Take 250 mg by mouth every 6 (six) hours as needed for moderate pain.    Historical Provider, MD  amoxicillin-clavulanate (AUGMENTIN) 875-125 MG per tablet Take 1 tablet by mouth 2 (two) times daily. 01/21/15   Lelon Perla, DO  aspirin EC 81 MG EC tablet Take 1 tablet (81 mg total) by mouth daily. 05/29/13   Jeralyn Bennett, MD  atorvastatin (LIPITOR) 20 MG tablet TAKE 1 TABLET BY MOUTH DAILY AT [facility] 09/22/14   Kathleene Hazel, MD  clonazePAM (KLONOPIN) 0.5 MG tablet TAKE 1 TABLET BY MOUTH THREE TIMES DAILY FOR ANXIETY, AGITATION, OR SLEEP 03/22/15   Lelon Perla, DO  diclofenac sodium (VOLTAREN) 1 % GEL Apply 2 g topically 4 (four) times  daily. 08/25/14   Lelon Perla, DO  DIGOX 125 MCG tablet TAKE 1 TABLET BY MOUTH EVERY DAY 10/26/14   Kathleene Hazel, MD  donepezil (ARICEPT) 5 MG tablet Take 1 tablet (5 mg total) by mouth at bedtime. 10/08/14   Grayling Congress Lowne, DO  furosemide (LASIX) 40 MG tablet TAKE 2 TABLETS BY MOUTH EVERY AM AND TAKE 1 TABLET EVERY AFTERNOON 01/20/15   Kathleene Hazel, MD  oxybutynin (DITROPAN-XL) 5 MG 24 hr tablet Take 1 tablet (5 mg total) by mouth daily. 11/30/14   Lelon Perla, DO  polyethylene glycol powder (MIRALAX) powder Take 17 g by mouth daily.     Historical Provider, MD  PROAIR HFA 108 (90 BASE) MCG/ACT inhaler INHALE 2 PUFFS INTO LUNGS EVERY 6 HOURS AS NEEDED FOR WHEEZING OR SHORTNESS OF BREATH 02/18/15   Grayling Congress Lowne, DO  QUEtiapine  (SEROQUEL) 100 MG tablet TAKE 1 TABLET BY MOUTH EVERY NIGHT AT BEDTIME 01/19/15   Yvonne R Lowne, DO   BP 121/56 mmHg  Pulse 81  Temp(Src) 98.5 F (36.9 C) (Oral)  Resp 18  Ht 5\' 5"  (1.651 m)  SpO2 98% Physical Exam  Constitutional: She appears well-developed and well-nourished.  HENT:  Head: Normocephalic and atraumatic.  Cardiovascular: Normal rate and regular rhythm.   No murmur heard. Pulmonary/Chest: Effort normal. No respiratory distress.  Occasional crackles in bilateral bases  Abdominal: Soft. There is no tenderness. There is no rebound and no guarding.  Musculoskeletal: She exhibits no tenderness.  Mild pitting edema in BLE, right greater than left  Neurological: She is alert.  Confused, hard of hearing.   Skin: Skin is warm and dry.  Psychiatric: She has a normal mood and affect. Her behavior is normal.  Nursing note and vitals reviewed.   ED Course  Procedures (including critical care time) Labs Review Labs Reviewed  COMPREHENSIVE METABOLIC PANEL - Abnormal; Notable for the following:    Chloride 98 (*)    Glucose, Bld 108 (*)    Creatinine, Ser 1.02 (*)    Albumin 3.2 (*)    GFR calc non Af Amer 49 (*)    GFR calc Af Amer 56 (*)    All other components within normal limits  CBC WITH DIFFERENTIAL/PLATELET - Abnormal; Notable for the following:    WBC 12.4 (*)    Neutro Abs 8.5 (*)    Monocytes Absolute 1.4 (*)    Eosinophils Relative 6 (*)    All other components within normal limits  TROPONIN I - Abnormal; Notable for the following:    Troponin I 0.07 (*)    All other components within normal limits  BRAIN NATRIURETIC PEPTIDE - Abnormal; Notable for the following:    B Natriuretic Peptide 569.8 (*)    All other components within normal limits  CREATININE, SERUM - Abnormal; Notable for the following:    Creatinine, Ser 1.20 (*)    GFR calc non Af Amer 40 (*)    GFR calc Af Amer 46 (*)    All other components within normal limits  URINE CULTURE   URINALYSIS, ROUTINE W REFLEX MICROSCOPIC (NOT AT [facility])  DIGOXIN LEVEL  CBC  BASIC METABOLIC PANEL    Imaging Review Dg Chest 2 View  03/28/2015   CLINICAL DATA:  Increase shortness of breath with weakness and lower extremity swelling. Decreased urine output.  EXAM: CHEST  2 VIEW  COMPARISON:  01/21/2015  FINDINGS: Patient is rotated to the right. Lungs are hypoinflated without  focal consolidation or effusion. There is mild stable cardiomegaly. There is calcified plaque over the thoracoabdominal aorta. There are degenerative changes of the spine with curvature of the thoracolumbar spine convex left unchanged.  IMPRESSION: Hypoinflation without acute cardiopulmonary disease.   Electronically Signed   By: Elberta Fortis M.D.   On: 03/28/2015 15:54     EKG Interpretation   Date/Time:  Sunday March 28 2015 15:23:42 EDT Ventricular Rate:  75 PR Interval:    QRS Duration: 102 QT Interval:  392 QTC Calculation: 437 R Axis:   -26 Text Interpretation:  Atrial fibrillation Incomplete right bundle branch  block Cannot rule out Anteroseptal infarct , age undetermined Marked ST  abnormality, possible lateral subendocardial injury Abnormal ECG Confirmed  by Lincoln Brigham [phone]) on 03/28/2015 3:34:06 PM      MDM   Final diagnoses:  Dyspnea    Patient with history of dementia here for progressive shortness of breath and decreased urinary output. She does have a history of congestive heart failure, she is not volume overloaded on examination. Discussed with Dr. Donnie Aho regarding elevated troponin, EKG with ST changes, similar to priors but slightly worse. Dr. Donnie Aho will see the patient in transfer.      Tilden Fossa, MD 03/28/15 [phone]

## 2015-03-28 NOTE — ED Notes (Signed)
Pt in c/o SOB and scant urine output. Pt has dementia and CHF, takes lasix. Breathing equal and unlabored in triage.

## 2015-03-28 NOTE — ED Notes (Signed)
High Fall Risk safety measures remain in place

## 2015-03-28 NOTE — ED Notes (Signed)
MD at bedside. 

## 2015-03-29 ENCOUNTER — Encounter (HOSPITAL_COMMUNITY): Payer: Self-pay | Admitting: Physician Assistant

## 2015-03-29 ENCOUNTER — Ambulatory Visit (INDEPENDENT_AMBULATORY_CARE_PROVIDER_SITE_OTHER): Payer: Medicare Other

## 2015-03-29 DIAGNOSIS — I429 Cardiomyopathy, unspecified: Secondary | ICD-10-CM | POA: Diagnosis not present

## 2015-03-29 DIAGNOSIS — I5043 Acute on chronic combined systolic (congestive) and diastolic (congestive) heart failure: Principal | ICD-10-CM

## 2015-03-29 DIAGNOSIS — F0391 Unspecified dementia with behavioral disturbance: Secondary | ICD-10-CM

## 2015-03-29 DIAGNOSIS — I482 Chronic atrial fibrillation: Secondary | ICD-10-CM

## 2015-03-29 DIAGNOSIS — I509 Heart failure, unspecified: Secondary | ICD-10-CM | POA: Diagnosis not present

## 2015-03-29 LAB — BASIC METABOLIC PANEL
ANION GAP: 8 (ref 5–15)
BUN: 16 mg/dL (ref 6–20)
CHLORIDE: 100 mmol/L — AB (ref 101–111)
CO2: 29 mmol/L (ref 22–32)
CREATININE: 1.09 mg/dL — AB (ref 0.44–1.00)
Calcium: 8.7 mg/dL — ABNORMAL LOW (ref 8.9–10.3)
GFR calc Af Amer: 52 mL/min — ABNORMAL LOW (ref >60)
GFR calc non Af Amer: 45 mL/min — ABNORMAL LOW (ref >60)
Glucose, Bld: 115 mg/dL — ABNORMAL HIGH (ref 65–99)
POTASSIUM: 3.5 mmol/L (ref 3.5–5.1)
Sodium: 137 mmol/L (ref 135–145)

## 2015-03-29 MED ORDER — FUROSEMIDE 40 MG PO TABS
40.0000 mg | ORAL_TABLET | Freq: Two times a day (BID) | ORAL | Status: DC
  Administered 2015-03-29: 40 mg via ORAL
  Filled 2015-03-29: qty 1

## 2015-03-29 MED ORDER — FUROSEMIDE 80 MG PO TABS: 80.0000 mg | ORAL_TABLET | Freq: Two times a day (BID) | ORAL | Status: DC

## 2015-03-29 MED ORDER — PERFLUTREN LIPID MICROSPHERE
1.0000 mL | INTRAVENOUS | Status: AC | PRN
  Administered 2015-03-29: 2 mL via INTRAVENOUS
  Filled 2015-03-29: qty 10

## 2015-03-29 MED ORDER — FUROSEMIDE 40 MG PO TABS: ORAL_TABLET | ORAL | Status: DC

## 2015-03-29 NOTE — Progress Notes (Signed)
Echocardiogram 2D Echocardiogram with Definity has been performed.  [name] 03/29/2015, 11:14 AM

## 2015-03-29 NOTE — Progress Notes (Signed)
Subjective: No obvious issues  Objective: Vital signs in last 24 hours: Temp:  [97.5 F (36.4 C)-98.5 F (36.9 C)] 97.8 F (36.6 C) (06/27 0500) Pulse Rate:  [54-81] 64 (06/27 0500) Resp:  [16-18] 16 (06/27 0500) BP: (102-126)/(51-58) 117/52 mmHg (06/27 0500) SpO2:  [96 %-98 %] 98 % (06/27 0500) Weight:  [136 lb 11.2 oz (62.007 kg)] 136 lb 11.2 oz (62.007 kg) (06/26 2051)    Intake/Output from previous day: 06/26 0701 - 06/27 0700 In: -  Out: 50 [Urine:50] Intake/Output this shift:    Medications Scheduled Meds: . aspirin EC  81 mg Oral Daily  . atorvastatin  20 mg Oral q1800  . digoxin  125 mcg Oral Daily  . donepezil  5 mg Oral QHS  . enoxaparin (LOVENOX) injection  40 mg Subcutaneous Q24H  . feeding supplement (ENSURE ENLIVE)  237 mL Oral BID BM  . furosemide  40 mg Intravenous Q12H  . oxybutynin  5 mg Oral Daily  . polyethylene glycol  17 g Oral Daily  . QUEtiapine  100 mg Oral QHS  . sodium chloride  3 mL Intravenous Q12H   Continuous Infusions:  PRN Meds:.sodium chloride, acetaminophen, clonazePAM, ondansetron (ZOFRAN) IV, sodium chloride  PE: General appearance: alert, cooperative and no distress Neck: No JVD Lungs: Mild basilar crackles.  Heart: irregularly irregular rhythm and No MRG Abdomen: +BS, nontender Extremities: No LEE Pulses: 2+ and symmetric Skin: warm and dry Neurologic: Grossly normal  Lab Results:   Recent Labs  03/28/15 1530 03/28/15 2200  WBC 12.4* 10.5  HGB 14.7 13.7  HCT 44.1 42.0  PLT 319 285   BMET  Recent Labs  03/28/15 1530 03/28/15 2200 03/29/15 0325  NA 136  --  137  K 3.9  --  3.5  CL 98*  --  100*  CO2 26  --  29  GLUCOSE 108*  --  115*  BUN 20  --  16  CREATININE 1.02* 1.20* 1.09*  CALCIUM 8.9  --  8.7*    Assessment/Plan 81 year old female has severe dementia and is essentially homebound. She is able to get out only with a wheelchair and has to pivot to stand. She has a history of chronic  atrial fibrillation and is not felt to be a candidate for anticoagulation due to her dementia. She requires round-the-clock care at home. She was hospitalized in 2014 with atrial fibrillation and also found to be in heart failure. The echo(05/2013) showed an ejection fraction of 35%. Catheterization reportedly low normal ejection fraction of 50% with an 80% LAD stenosis that has been treated medically. She was hospitalized with a TIA in December of this year. She has been at home and had taken an extra dose of Lasix recently and her caregivers noted reduced urine output and some sacral edema as well as worsening shortness of breath.  Principal Problem:   Acute on chronic combined systolic and diastolic CHF, NYHA class 2 Net fluids: ?.   UOP x 5.  She appears comfortable and looks euvolemic.  Mild basilar crackles which could be atelectasis.  No JVD or LEE.  SP one dose  IV lasix.  Home lasix is 80/40.  Put 40BID PO.  Echo pending.   SCr stable.     Essential hypertension  Controlled   Permanent atrial fibrillation  Rate controlled.    Dementia-not felt to be a candidate for anticoagulation   CAD- 80% LAD Aug 2014- med Rx   Nonischemic cardiomyopathy  She can  likely be discharged later today.       Wilburt Finlay PA-C 03/29/2015 8:04 AM  Patient seen and examined and history reviewed. Agree with above findings and plan. Patient unable to give history. Echo in process. Difficult to judge diuretic effect. I/O incomplete and incontinent. Increased urine output noted. She is comfortable. Afib rate is well controlled. She has mild acute on chronic CHF. I think we can DC to home later today but would favor increasing lasix to 80 mg bid for 4 days then resuming 80/40 mg dose she was taking. Slight troponin elevation of unclear significance but probably due to demand ischemia. She is a NO CODE BLUE and mostly bed bound at home with around the clock care.   Peter [place], MDFACC 03/29/2015 10:37  AM

## 2015-03-29 NOTE — Discharge Summary (Signed)
Discharge Summary   Patient ID: [name] MRN: [id number], DOB/AGE: 05-28-1928 81 y.o. Admit date: 03/28/2015 D/C date:     03/29/2015  Primary Care Provider: [name], DO Primary Cardiologist: Clifton James   Primary Discharge Diagnoses:  1. Acute on chronic combined CHF 2. Essential HTN 3. Permanent atrial fibrillation, rate controlled 4. Dementia-not felt to be a candidate for anticoagulation 5. CAD- 80% LAD Aug 2014- med Rx - elevated troponin felt due to demand ischemia 6. Reported h/o nonischemic cardiomyopathy 7. Probable CKD stage III based on historical Cr data  PMH:  Past Medical History  Diagnosis Date  . Hypertension   . Osteoporosis   . Overactive bladder   . Chronic combined systolic and diastolic CHF (congestive heart failure) 05/20/2013    a. Echo (8/14):  mild LVH, EF 35%, diff HK, mild MR  . Permanent atrial fibrillation     not a coumadin candidate  . Dementia   . Arthritis   . CAD (coronary artery disease)     a. LHC (8/14):  prox to mid LAD 80, pRCA 30, pPDA 30-40, EF 50%  . Hyponatremia   . NICM (nonischemic cardiomyopathy)   . CKD (chronic kidney disease), stage III     Hospital Course: [name] is an 81 y/o female with severe dementia, permanent atrial fibrillation, CAD, TIA, arthritis, TN, NICM who presented to [facility] with edema and SOB. She is essentially homebound.She is able to get out only with a wheelchair and has to pivot to stand.She has a history of chronic atrial fibrillation and is not felt to be a candidate for anticoagulation due to her dementia. She requires round-the-clock care at home.She was hospitalized in 2014 with atrial fibrillation and also found to be in heart failure with EF 35%. Catheterization reportedly low normal ejection fraction of 50% with an 80% LAD stenosis that has been treated medically.She was hospitalized with a TIA in December of last year.She is a no CODE BLUE.She has been at home and had taken  an extra dose of Lasix recently and her caregivers noted reduced urine output and some sacral edema as well as worsening shortness of breath.She was advised to go to the emergency room in the ER doctor felt that she should be admitted. Essentially no history was obtainable from the patient.Her caregivers were with her as well as her family.She may have had some chest pain a couple of weeks ago. She was not acutely short of breath during cardiology evaluation yesterday, but according to the family she may have been more short of breath with edema in her sacral area. Urine output was reduced. CXR showed mild cardiomegaly, mildly underinflated lungs, no acute heart failure. EKG showed atrial fibrillation with controlled response, possible old anteroseptal infarction, nonspecific ST changes laterally. WBC was 12.4 but normalized oday. Initial labs showed BNP 569, troponin 0.07 - troponin was felt due to demand ischemia and in light of comorbidities and absence of further chest pain, continued medical therapy was recommended. She was treated with IV Lasix x 2 doses and reassessed this morning. SCr was stable (given age and weight, range appears CKD stage III). She reported increased urine output (incontinent). 2D echo was performed showing EF 40-45%, normal wall thickness, moderate hypokinesis of anteroseptal myocardium. Dr. Swaziland recommended Lasix 80mg  twice a day for 4 days then return to 80mg  QAM/40mg  QPM. She has a follow-up appointment already on Wednesday with Dr. Clifton James and Dr. Swaziland would like for her to keep this.  Dr. Swaziland has seen and examined the patient today and feels she is stable for discharge. Discharge weight 136lb.  Discharge Vitals: Blood pressure 117/52, pulse 64, temperature 97.8 F (36.6 C), temperature source Oral, resp. rate 16, height 5\' 5"  (1.651 m), weight 136 lb 11.2 oz (62.007 kg), SpO2 98 %.  Labs: Lab Results  Component Value Date   WBC 10.5 03/28/2015   HGB 13.7  03/28/2015   HCT 42.0 03/28/2015   MCV 87.9 03/28/2015   PLT 285 03/28/2015    Recent Labs Lab 03/28/15 1530  03/29/15 0325  NA 136  --  137  K 3.9  --  3.5  CL 98*  --  100*  CO2 26  --  29  BUN 20  --  16  CREATININE 1.02*  < > 1.09*  CALCIUM 8.9  --  8.7*  PROT 6.6  --   --   BILITOT 0.7  --   --   ALKPHOS 99  --   --   ALT 21  --   --   AST 28  --   --   GLUCOSE 108*  --  115*  < > = values in this interval not displayed.  Recent Labs  03/28/15 1530  TROPONINI 0.07*   Lab Results  Component Value Date   CHOL 126 05/23/2013   HDL 57 05/23/2013   LDLCALC 58 05/23/2013   TRIG 57 05/23/2013   Diagnostic Studies/Procedures   Dg Chest 2 View  03/28/2015   CLINICAL DATA:  Increase shortness of breath with weakness and lower extremity swelling. Decreased urine output.  EXAM: CHEST  2 VIEW  COMPARISON:  01/21/2015  FINDINGS: Patient is rotated to the right. Lungs are hypoinflated without focal consolidation or effusion. There is mild stable cardiomegaly. There is calcified plaque over the thoracoabdominal aorta. There are degenerative changes of the spine with curvature of the thoracolumbar spine convex left unchanged.  IMPRESSION: Hypoinflation without acute cardiopulmonary disease.   Electronically Signed   By: Elberta Fortis M.D.   On: 03/28/2015 15:54   2D Echo 03/29/15 Left ventricle: The cavity size was normal. Wall thickness was normal. Systolic function was mildly to moderately reduced. The estimated ejection fraction was in the range of 40% to 45%. There is moderate hypokinesis of the anteroseptal myocardium.  Discharge Medications   Current Discharge Medication List    CONTINUE these medications which have CHANGED   Details  furosemide (LASIX) 40 MG tablet Take 2 tablets by mouth by mouth twice a day for 4 days, then decrease to 2 tablets in the morning & 1 tablet in the evening. Qty: 90 tablet, Refills: 0      CONTINUE these medications which have  NOT CHANGED   Details  acetaminophen (TYLENOL) 500 MG tablet Take 250 mg by mouth every 6 (six) hours as needed for moderate pain.    aspirin EC 81 MG EC tablet Take 1 tablet (81 mg total) by mouth daily.     atorvastatin (LIPITOR) 20 MG tablet TAKE 1 TABLET BY MOUTH DAILY AT 6PM     clonazePAM (KLONOPIN) 0.5 MG tablet TAKE 1 TABLET BY MOUTH THREE TIMES DAILY FOR ANXIETY, AGITATION, OR SLEEP     diclofenac sodium (VOLTAREN) 1 % GEL Apply 2 g topically 4 (four) times daily.    Associated Diagnoses: Primary osteoarthritis of both knees    DIGOX 125 MCG tablet TAKE 1 TABLET BY MOUTH EVERY DAY     donepezil (ARICEPT) 5 MG tablet  Take 1 tablet (5 mg total) by mouth at bedtime.     oxybutynin (DITROPAN-XL) 5 MG 24 hr tablet Take 1 tablet (5 mg total) by mouth daily.     polyethylene glycol powder (MIRALAX) powder Take 17 g by mouth daily.     PROAIR HFA 108 (90 BASE) MCG/ACT inhaler INHALE 2 PUFFS INTO LUNGS EVERY 6 HOURS AS NEEDED FOR WHEEZING OR SHORTNESS OF BREATH     QUEtiapine (SEROQUEL) 100 MG tablet TAKE 1 TABLET BY MOUTH EVERY NIGHT AT BEDTIME       STOP taking these medications     amoxicillin-clavulanate (AUGMENTIN) 875-125 MG per tablet - finished course prior to admission         Disposition   The patient will be discharged in stable condition to home. Discharge Instructions    Diet - low sodium heart healthy    Complete by:  As directed      Increase activity slowly    Complete by:  As directed   See new Lasix instructions.          Follow-up Information    Follow up with [name], MD.   Specialty:  Cardiology   Why:  03/31/15 at 9am   Contact information:   [address] [place] [address] [phone]         Duration of Discharge Encounter: Greater than 30 minutes including physician and PA time.  Thomasene Mohair PA-C 03/29/2015, 12:51 PM

## 2015-03-29 NOTE — Care Management Note (Signed)
Case Management Note  Patient Details  Name: [name] MRN: [id number] Date of Birth: May 11, 1928  Subjective/Objective:  Pt admitted for Acute on Chronic HF. Pt is from home with 24 hr supervision.                   Action/Plan: Per daughter pt will not need any HH at this time. Pt has DME RW and 3n1. Sitters assist with medications. CM did make family aware that if pt were to need any services to contact PCP for further needs and orders. No further needs at this time.   Expected Discharge Date:                  Expected Discharge Plan:  Home/Self Care  In-House Referral:     Discharge planning Services  CM Consult  Post Acute Care Choice:    Choice offered to:     DME Arranged:    DME Agency:     HH Arranged:    HH Agency:     Status of Service:  Completed, signed off  Medicare Important Message Given:    Date Medicare IM Given:    Medicare IM give by:    Date Additional Medicare IM Given:    Additional Medicare Important Message give by:     If discussed at Long Length of Stay Meetings, dates discussed:    Additional Comments:  Gala Lewandowsky, RN 03/29/2015, 12:56 PM

## 2015-03-30 ENCOUNTER — Telehealth: Payer: Self-pay | Admitting: *Deleted

## 2015-03-30 LAB — URINE CULTURE: Culture: NO GROWTH

## 2015-03-30 NOTE — Telephone Encounter (Signed)
No -- she can f/u as scheduled

## 2015-03-30 NOTE — Telephone Encounter (Signed)
Patient admitted to hospital for dyspnea/ CHF exacerbation and discharge instructions state Follow up with [facility], MD.  Would you like for her to follow-up here as well?

## 2015-03-31 ENCOUNTER — Encounter: Payer: Self-pay | Admitting: Cardiovascular Disease

## 2015-03-31 ENCOUNTER — Ambulatory Visit (INDEPENDENT_AMBULATORY_CARE_PROVIDER_SITE_OTHER): Payer: Medicare Other | Admitting: Cardiovascular Disease

## 2015-03-31 VITALS — BP 102/62 | HR 64 | Ht 64.0 in | Wt 136.0 lb

## 2015-03-31 DIAGNOSIS — I2510 Atherosclerotic heart disease of native coronary artery without angina pectoris: Secondary | ICD-10-CM | POA: Diagnosis not present

## 2015-03-31 DIAGNOSIS — I4819 Other persistent atrial fibrillation: Secondary | ICD-10-CM

## 2015-03-31 DIAGNOSIS — I5022 Chronic systolic (congestive) heart failure: Secondary | ICD-10-CM | POA: Diagnosis not present

## 2015-03-31 DIAGNOSIS — I428 Other cardiomyopathies: Secondary | ICD-10-CM

## 2015-03-31 DIAGNOSIS — I481 Persistent atrial fibrillation: Secondary | ICD-10-CM

## 2015-03-31 DIAGNOSIS — I429 Cardiomyopathy, unspecified: Secondary | ICD-10-CM

## 2015-03-31 LAB — BASIC METABOLIC PANEL
BUN: 19 mg/dL (ref 6–23)
CALCIUM: 9.1 mg/dL (ref 8.4–10.5)
CO2: 30 meq/L (ref 19–32)
Chloride: 97 mEq/L (ref 96–112)
Creatinine, Ser: 1.1 mg/dL (ref 0.40–1.20)
GFR: 50.13 mL/min — AB (ref >60.00)
Glucose, Bld: 110 mg/dL — ABNORMAL HIGH (ref 70–99)
Potassium: 3.7 mEq/L (ref 3.5–5.1)
Sodium: 136 mEq/L (ref 135–145)

## 2015-03-31 MED ORDER — FUROSEMIDE 40 MG PO TABS: 80.0000 mg | ORAL_TABLET | Freq: Two times a day (BID) | ORAL | Status: DC

## 2015-03-31 NOTE — Patient Instructions (Addendum)
Medication Instructions:  Furosemide dose is 80 mg by mouth twice daily  Labwork: Lab work to be done today.  Your physician recommends that you return for lab work in: 3 weeks.  Scheduled for April 21, 2015.  The lab opens at 7:30 AM   Testing/Procedures: none  Follow-Up: Your physician wants you to follow-up in: 6 months You will receive a reminder letter in the mail two months in advance. If you don't receive a letter, please call our office to schedule the follow-up appointment.

## 2015-03-31 NOTE — Progress Notes (Signed)
Chief Complaint  Patient presents with  . Follow-up      History of Present Illness: 81 yo female with history of HTN, persistent atrial fibrillation, severe dementia, CAD, non-ischemic cardiomyopathy here today for follow up. She was admitted to [facility]/20/13 with volume overload, atrial fibrillation with RVR. Echo 05/21/13 with dilated LV cavity, LVEF 35%, mild MR. Cardiac cath 05/22/13 per Dr. Swaziland with 80% proximal LAD stenosis, 30% RCA stenosis. Medical management was recommended. She was diuresed with IV Lasix and was rate controlled with low dose Coreg and Digoxin. She was not felt to be a good candidate for long term anti-coagulation given advanced age and dementia. She did develop pulmonary edema at home which improved with higher dose of Lasix. Admitted to [facility] 09/09/14 with bradycardia and mental status changes. No evidence of TIA or CVA. Her Coreg was stopped and her HR improved. Admitted to [facility] 03/28/15-03/29/15 with mild volume overload. Urine output was reduced. CXR showed mild cardiomegaly, mildly underinflated lungs, no acute heart failure. EKG showed atrial fibrillation with controlled response, possible old anteroseptal infarction, nonspecific ST changes laterally. Initial labs showed BNP 569, troponin 0.07 - troponin was felt due to demand ischemia and in light of comorbidities and absence of further chest pain, continued medical therapy was recommended. She was treated with IV Lasix x 2 doses with improvement. 2D echo was performed showing EF 40-45%, normal wall thickness, moderate hypokinesis of anteroseptal myocardium. Her Lasix was increased to 80 mg twice a day for 4 days with plan to return to 80mg  QAM/40mg  QPM.   She is here today for follow up. She is in a wheelchair. She is with her son and caregiver. No chest pain or SOB. Breathing much better since discharge. No complaints. No LE edema.    Primary Care Physician: Laury Axon  Last Lipid Profile:Lipid Panel     Component  Value Date/Time   CHOL 126 05/23/2013 0500   TRIG 57 05/23/2013 0500   HDL 57 05/23/2013 0500   CHOLHDL 2.2 05/23/2013 0500   VLDL 11 05/23/2013 0500   LDLCALC 58 05/23/2013 0500    Past Medical History  Diagnosis Date  . Hypertension   . Osteoporosis   . Overactive bladder   . Chronic combined systolic and diastolic CHF (congestive heart failure) 05/20/2013    a. Echo (8/14):  mild LVH, EF 35%, diff HK, mild MR. EF 50% by cath. b. Echo 03/2014: EF 40-45%.  Marland Kitchen Permanent atrial fibrillation     not a coumadin candidate  . Dementia   . Arthritis   . CAD (coronary artery disease)     a. LHC (8/14):  prox to mid LAD 80, pRCA 30, pPDA 30-40, EF 50%  . Hyponatremia   . NICM (nonischemic cardiomyopathy)   . CKD (chronic kidney disease), stage III     Past Surgical History  Procedure Laterality Date  . Abdominal hysterectomy      TAH BSO  . Spine surgery  1980    Lumbar surgery  . Left heart catheterization with coronary angiogram N/A 05/22/2013    Procedure: LEFT HEART CATHETERIZATION WITH CORONARY ANGIOGRAM;  Surgeon: Tonny Bollman, MD;  Location[facility] C[facility] LAB;  Service: Cardiovascular;  Laterality: N/A;    Current Outpatient Prescriptions  Medication Sig Dispense Refill  . acetaminophen (TYLENOL) 500 MG tablet Take 250 mg by mouth every 6 (six) hours as needed for moderate pain.    Marland Kitchen aspirin EC 81 MG EC tablet Take 1 tablet (81 mg total)  by mouth daily. 30 tablet 0  . atorvastatin (LIPITOR) 20 MG tablet TAKE 1 TABLET BY MOUTH DAILY AT 6PM 30 tablet 0  . clonazePAM (KLONOPIN) 0.5 MG tablet TAKE 1 TABLET BY MOUTH THREE TIMES DAILY FOR ANXIETY, AGITATION, OR SLEEP 30 tablet 0  . diclofenac sodium (VOLTAREN) 1 % GEL Apply 2 g topically 4 (four) times daily. 100 g 3  . DIGOX 125 MCG tablet TAKE 1 TABLET BY MOUTH EVERY DAY 30 tablet 5  . donepezil (ARICEPT) 5 MG tablet Take 1 tablet (5 mg total) by mouth at bedtime. 30 tablet 11  . furosemide (LASIX) 40 MG tablet Take 2 tablets (80 mg  total) by mouth 2 (two) times daily. 120 tablet 6  . oxybutynin (DITROPAN-XL) 5 MG 24 hr tablet Take 1 tablet (5 mg total) by mouth daily. 30 tablet 11  . polyethylene glycol powder (MIRALAX) powder Take 17 g by mouth daily.     Marland Kitchen PROAIR HFA 108 (90 BASE) MCG/ACT inhaler INHALE 2 PUFFS INTO LUNGS EVERY 6 HOURS AS NEEDED FOR WHEEZING OR SHORTNESS OF BREATH 8.5 g 2  . QUEtiapine (SEROQUEL) 100 MG tablet TAKE 1 TABLET BY MOUTH EVERY NIGHT AT BEDTIME 30 tablet 5   No current facility-administered medications for this visit.    No Known Allergies  History   Social History  . Marital Status: Married    Spouse Name: N/A  . Number of Children: N/A  . Years of Education: N/A   Occupational History  . retired     Engineer, civil (consulting)  . volunteers at hospice    Social History Main Topics  . Smoking status: Never Smoker   . Smokeless tobacco: Never Used  . Alcohol Use: No  . Drug Use: No  . Sexual Activity: No   Other Topics Concern  . Not on file   Social History Narrative   Pt lives at home with around the clock care    Family History  Problem Relation Age of Onset  . Depression    . Heart attack    . Coronary artery disease    . Coronary artery disease Mother 17  . Heart attack Son 63    Review of Systems:  As stated in the HPI and otherwise negative.   BP 102/62 mmHg  Pulse 64  Ht 5\' 4"  (1.626 m)  Wt 136 lb (61.689 kg)  BMI 23.33 kg/m2  Physical Examination: General: Well developed, well nourished, NAD HEENT: OP clear, mucus membranes moist SKIN: warm, dry. No rashes. Neuro: No focal deficits Musculoskeletal: Muscle strength 5/5 all ext Psychiatric: Mood and affect normal Neck: No JVD, no carotid bruits, no thyromegaly, no lymphadenopathy. Lungs:Clear on left with scattered wheezes right lung. No rhonci, crackles Cardiovascular: Irregular irregular. No murmurs, gallops or rubs. Abdomen:Soft. Bowel sounds present. Non-tender.  Extremities: No lower extremity edema. Pulses  are 2 + in the bilateral DP/PT.  Echo 03/29/15: Left ventricle: The cavity size was normal. Wall thickness was normal. Systolic function was mildly to moderately reduced. The estimated ejection fraction was in the range of 40% to 45%. There is moderate hypokinesis of the anteroseptal myocardium.  Cardiac cath 05/22/13:  Left mainstem: Normal  Left anterior descending (LAD): The LAD is moderately calcified. There is an 80% stenosis in the proximal to mid vessel. The distal LAD appears relatively small.  There is a large ramus intermediate branch that trifurcates on the lateral wall. It is normal.  Left circumflex (LCx): The left circumflex is a tiny vessel  that terminates early in the AV groove.  Right coronary artery (RCA): The RCA is a large dominant vessel. It gives rise to 3 posterolateral branches and the PDA. There is 30% disease in the proximal RCA. There is 30-40% disease in the proximal PDA.  Left ventriculography: Left ventricular systolic function is abnormal, LVEF is estimated at 50%, there is mild global hypokinesis.There is no significant mitral regurgitation  Final Conclusions:  1. Single vessel obstructive CAD involving the LAD.  2. Low normal LV systolic function. EF 50%.  3. Low LV filling pressures.   EKG:  EKG is not ordered today. The ekg ordered today demonstrates   Recent Labs: 03/28/2015: ALT 21; B Natriuretic Peptide 569.8*; Hemoglobin 13.7; Platelets 285 03/31/2015: BUN 19; Creatinine, Ser 1.10; Potassium 3.7; Sodium 136   Lipid Panel    Component Value Date/Time   CHOL 126 05/23/2013 0500   TRIG 57 05/23/2013 0500   HDL 57 05/23/2013 0500   CHOLHDL 2.2 05/23/2013 0500   VLDL 11 05/23/2013 0500   LDLCALC 58 05/23/2013 0500   LDLDIRECT 91.4 06/29/2011 1340     Wt Readings from Last 3 Encounters:  03/31/15 136 lb (61.689 kg)  03/28/15 136 lb 11.2 oz (62.007 kg)  01/21/15 139 lb (63.05 kg)     Other studies Reviewed: Additional studies/ records  that were reviewed today include: . Review of the above records demonstrates:    Assessment and Plan:   1. CAD: Stable. No angina. Continue current meds.   2. Atrial fibrillation: Rate controlled on digoxin. Coreg stopped due to bradycardia in December 2015. She is not on anti-coagulation after long discussions regarding this with pt and family. Risk of anti-coagulation felt to outweigh potential benefit given advanced age and dementia.   3. Chronic systolic CHF: LVEF is 40-45%. Will continue Lasix 80 mg po BID. Check BMET today. Repeat BMET in 3 weeks.   4. Non-ischemic Cardiomyopathy: Medical management. Not ICD candidate given advanced age and dementia.   5. Severe dementia: She is a DNR. Dementia seems to have progressed since I last saw her  Current medicines are reviewed at length with the patient today.  The patient does not have concerns regarding medicines.  The following changes have been made:  no change  Labs/ tests ordered today include:   Orders Placed This Encounter  Procedures  . Basic Metabolic Panel (BMET)  . Basic Metabolic Panel (BMET)    Disposition:   FU with me in 6 months  Signed, [name], MD 03/31/2015 3:07 PM    [facility] [address] [place], [place], [place]  [address] Phone: [phone]; Fax: [phone]

## 2015-04-13 ENCOUNTER — Other Ambulatory Visit: Payer: Self-pay | Admitting: Family Medicine

## 2015-04-13 NOTE — Telephone Encounter (Signed)
Last seen 01/21/15 and filled 03/22/15 #30   Please advise    KP

## 2015-04-20 ENCOUNTER — Other Ambulatory Visit: Payer: Self-pay | Admitting: Cardiovascular Disease

## 2015-04-21 ENCOUNTER — Telehealth: Payer: Self-pay | Admitting: *Deleted

## 2015-04-21 ENCOUNTER — Other Ambulatory Visit (INDEPENDENT_AMBULATORY_CARE_PROVIDER_SITE_OTHER): Payer: Medicare Other | Admitting: *Deleted

## 2015-04-21 DIAGNOSIS — E876 Hypokalemia: Secondary | ICD-10-CM

## 2015-04-21 DIAGNOSIS — I5022 Chronic systolic (congestive) heart failure: Secondary | ICD-10-CM | POA: Diagnosis not present

## 2015-04-21 LAB — BASIC METABOLIC PANEL
BUN: 19 mg/dL (ref 6–23)
CALCIUM: 9.2 mg/dL (ref 8.4–10.5)
CHLORIDE: 96 meq/L (ref 96–112)
CO2: 33 meq/L — AB (ref 19–32)
Creatinine, Ser: 1.01 mg/dL (ref 0.40–1.20)
GFR: 55.31 mL/min — ABNORMAL LOW (ref >60.00)
GLUCOSE: 119 mg/dL — AB (ref 70–99)
POTASSIUM: 3.4 meq/L — AB (ref 3.5–5.1)
SODIUM: 136 meq/L (ref 135–145)

## 2015-04-21 MED ORDER — POTASSIUM CHLORIDE CRYS ER 20 MEQ PO TBCR: 20.0000 meq | EXTENDED_RELEASE_TABLET | Freq: Two times a day (BID) | ORAL | Status: DC

## 2015-04-21 NOTE — Telephone Encounter (Signed)
Discussed labs with Thayer Ohm B NP and will have patient start K+ 20 meq BID recheck 1 week Advised son [name], verbalized understanding  K+ 3.4

## 2015-04-28 ENCOUNTER — Other Ambulatory Visit (INDEPENDENT_AMBULATORY_CARE_PROVIDER_SITE_OTHER): Payer: Medicare Other | Admitting: *Deleted

## 2015-04-28 DIAGNOSIS — E876 Hypokalemia: Secondary | ICD-10-CM

## 2015-04-28 LAB — BASIC METABOLIC PANEL
BUN: 19 mg/dL (ref 6–23)
CO2: 28 mEq/L (ref 19–32)
Calcium: 9.2 mg/dL (ref 8.4–10.5)
Chloride: 102 mEq/L (ref 96–112)
Creatinine, Ser: 1.06 mg/dL (ref 0.40–1.20)
GFR: 52.31 mL/min — ABNORMAL LOW (ref >60.00)
GLUCOSE: 100 mg/dL — AB (ref 70–99)
Potassium: 4.1 mEq/L (ref 3.5–5.1)
Sodium: 139 mEq/L (ref 135–145)

## 2015-05-18 ENCOUNTER — Other Ambulatory Visit: Payer: Self-pay | Admitting: Family Medicine

## 2015-05-18 NOTE — Telephone Encounter (Signed)
Last seen 01/21/15 and filled 04/13/15 #30   Please advise     KP

## 2015-06-22 ENCOUNTER — Other Ambulatory Visit: Payer: Self-pay | Admitting: Family Medicine

## 2015-06-22 NOTE — Telephone Encounter (Signed)
Last seen 01/21/15 and filled 05/18/15 #30   Please advise     KP

## 2015-07-08 ENCOUNTER — Ambulatory Visit: Payer: Medicare Other

## 2015-07-16 ENCOUNTER — Other Ambulatory Visit: Payer: Self-pay | Admitting: Family Medicine

## 2015-08-01 ENCOUNTER — Telehealth: Payer: Self-pay | Admitting: Physician Assistant

## 2015-08-01 NOTE — Telephone Encounter (Signed)
Patient's daughter, Nicholos Johns, called report patient was short of breath. Seems worse when she's lying down. Some swelling in her hips. She gave her usual 80 mg of Lasix this morning and then about 30 minutes later given an additional 40. Urinary output seems to have increased. We'll continue with a milligrams tonight and she'll reassess her in the morning. She continues to be short of breath she will give her an additional 40 Lasix and call the office.    I will send email to scheduling to get her in tomorrow or Tuesday.  Jolyne Laye, PAC

## 2015-08-02 ENCOUNTER — Telehealth: Payer: Self-pay | Admitting: Cardiovascular Disease

## 2015-08-02 DIAGNOSIS — I10 Essential (primary) hypertension: Secondary | ICD-10-CM

## 2015-08-02 DIAGNOSIS — I5043 Acute on chronic combined systolic (congestive) and diastolic (congestive) heart failure: Secondary | ICD-10-CM

## 2015-08-02 NOTE — Telephone Encounter (Signed)
Pt c/o swelling: STAT is pt has developed SOB within 24 hours  1. How long have you been experiencing swelling? 2days  2. Where is the swelling located? Hips and stomach  3.  Are you currently taking a "fluid pill"?Yes 80mg  Lasix  4.  Are you currently SOB? A little  5.  Have you traveled recently?No

## 2015-08-02 NOTE — Telephone Encounter (Signed)
Received call from patient's daughter Nicholos Johns.She stated mother has extra fluid in hips and thighs.Stated she has dementia and unable to weigh.Stated yesterday she had noticed wheezing.No swelling in lower legs and feet just hips and thighs.She normally takes lasix 80 mg twice a day.Yesterday she gave her a extra dose of lasix 40 mg.Today her hips and thighs swollen.No wheezing noticed.Stated she wanted to know if ok to take extra lasix for a few days.Dr.McAlhany out of office.Spoke to DOD Dr.Brackbill he advised ok to take a extra lasix 40 mg daily for the next 4 days.Advised bmet on Friday 08/06/15.

## 2015-08-02 NOTE — Progress Notes (Signed)
Cardiology Office Note   Date:  08/02/2015   ID:  [name], DOB 04-03-1928, MRN [id number]  PCP:  [name], DO  Cardiologist:  Dr. Clifton James    No chief complaint on file.     History of Present Illness: [name] is a 81 y.o. female who presents for increased edema and SOB --she rec'd extra 40 mg lasix 08/01/15 and was instructed by Dr. Patty Sermons to continue 40 mg daily for 4 days.  Now presenting for follow up.  Last seen by Dr. Clifton James in June of this year.    She has a history of HTN, persistent atrial fibrillation, severe dementia, CAD, non-ischemic cardiomyopathy.  In 05/20/13 with volume overload, atrial fibrillation with RVR. Echo 05/21/13 with dilated LV cavity, LVEF 35%, mild MR. Cardiac cath 05/22/13 per Dr. [place] with 80% proximal LAD stenosis, 30% RCA stenosis. Medical management was recommended. She was diuresed with IV Lasix and was rate controlled with low dose Coreg and Digoxin. She was not felt to be a good candidate for long term anti-coagulation given advanced age and dementia. She did develop pulmonary edema at home which improved with higher dose of Lasix. Admitted to [facility] 09/09/14 with bradycardia and mental status changes. No evidence of TIA or CVA. Her Coreg was stopped and her HR improved. Admitted to [facility] 03/28/15-03/29/15 with mild volume overload. Urine output was reduced. CXR showed mild cardiomegaly, mildly underinflated lungs, no acute heart failure. EKG showed atrial fibrillation with controlled response, possible old anteroseptal infarction, nonspecific ST changes laterally. Initial labs showed BNP 569, troponin 0.07 - troponin was felt due to demand ischemia and in light of comorbidities and absence of further chest pain, continued medical therapy was recommended. She was treated with IV Lasix x 2 doses with improvement. 2D echo was performed showing EF 40-45%, normal wall thickness, moderate hypokinesis of anteroseptal myocardium. Her Lasix was  increased to 80 mg twice a day for 4 days with plan to return to  QAM/40mg  QPM.   Past Medical History  Diagnosis Date  . Hypertension   . Osteoporosis   . Overactive bladder   . Chronic combined systolic and diastolic CHF (congestive heart failure) 05/20/2013    a. Echo (8/14):  mild LVH, EF 35%, diff HK, mild MR. EF 50% by cath. b. Echo 03/2014: EF 40-45%.  Marland Kitchen Permanent atrial fibrillation     not a coumadin candidate  . Dementia   . Arthritis   . CAD (coronary artery disease)     a. LHC (8/14):  prox to mid LAD 80, pRCA 30, pPDA 30-40, EF 50%  . Hyponatremia   . NICM (nonischemic cardiomyopathy)   . CKD (chronic kidney disease), stage III     Past Surgical History  Procedure Laterality Date  . Abdominal hysterectomy      TAH BSO  . Spine surgery  1980    Lumbar surgery  . Left heart catheterization with coronary angiogram N/A 05/22/2013    Procedure: LEFT HEART CATHETERIZATION WITH CORONARY ANGIOGRAM;  Surgeon: Tonny Bollman, MD;  Location: [facility] CATH LAB;  Service: Cardiovascular;  Laterality: N/A;     Current Outpatient Prescriptions  Medication Sig Dispense Refill  . acetaminophen (TYLENOL) 500 MG tablet Take 250 mg by mouth every 6 (six) hours as needed for moderate pain.    Marland Kitchen aspirin EC 81 MG EC tablet Take 1 tablet (81 mg total) by mouth daily. 30 tablet 0  . atorvastatin (LIPITOR) 20 MG tablet TAKE 1 TABLET BY  MOUTH DAILY AT 6PM 30 tablet 0  . clonazePAM (KLONOPIN) 0.5 MG tablet TAKE 1 TABLET BY MOUTH THREE TIMES DAILY AS NEEDED FOR ANXIETY OR SLEEP 30 tablet 0  . diclofenac sodium (VOLTAREN) 1 % GEL Apply 2 g topically 4 (four) times daily. 100 g 3  . DIGOX 125 MCG tablet TAKE 1 TABLET BY MOUTH EVERY DAY 30 tablet 5  . donepezil (ARICEPT) 5 MG tablet Take 1 tablet (5 mg total) by mouth at bedtime. 30 tablet 11  . furosemide (LASIX) 40 MG tablet Take 2 tablets (80 mg total) by mouth 2 (two) times daily. 120 tablet 6  . oxybutynin (DITROPAN-XL) 5 MG 24 hr tablet Take 1  tablet (5 mg total) by mouth daily. 30 tablet 11  . polyethylene glycol powder (MIRALAX) powder Take 17 g by mouth daily.     . potassium chloride SA (K-DUR,KLOR-CON) 20 MEQ tablet Take 1 tablet (20 mEq total) by mouth 2 (two) times daily. 60 tablet 3  . PROAIR HFA 108 (90 BASE) MCG/ACT inhaler INHALE 2 PUFFS INTO LUNGS EVERY 6 HOURS AS NEEDED FOR WHEEZING OR SHORTNESS OF BREATH 8.5 g 2  . QUEtiapine (SEROQUEL) 100 MG tablet TAKE 1 TABLET BY MOUTH EVERY NIGHT AT BEDTIME 30 tablet 5   No current facility-administered medications for this visit.    Allergies:   Review of patient's allergies indicates no known allergies.    Social History:  The patient  reports that she has never smoked. She has never used smokeless tobacco. She reports that she does not drink alcohol or use illicit drugs.   Family History:  The patient's family history includes Coronary artery disease in an other family member; Coronary artery disease (age of onset: 42) in her mother; Depression in an other family member; Heart attack in an other family member; Heart attack (age of onset: 77) in her son.    ROS:  General:no colds or fevers, no weight changes Skin:no rashes or ulcers HEENT:no blurred vision, no congestion CV:see HPI PUL:see HPI GI:no diarrhea constipation or melena, no indigestion GU:no hematuria, no dysuria MS:no joint pain, no claudication Neuro:no syncope, no lightheadedness Endo:no diabetes, no thyroid disease Wt Readings from Last 3 Encounters:  03/31/15 136 lb (61.689 kg)  03/28/15 136 lb 11.2 oz (62.007 kg)  01/21/15 139 lb (63.05 kg)     PHYSICAL EXAM: VS:  There were no vitals taken for this visit. , BMI There is no weight on file to calculate BMI. General:Pleasant affect, NAD Skin:Warm and dry, brisk capillary refill HEENT:normocephalic, sclera clear, mucus membranes moist Neck:supple, no JVD, no bruits  Heart:S1S2 RRR without murmur, gallup, rub or click Lungs:clear without rales,  rhonchi, or wheezes WFU:XNAT, non tender, + BS, do not palpate liver spleen or masses Ext:no lower ext edema, 2+ pedal pulses, 2+ radial pulses Neuro:alert and oriented, MAE, follows commands, + facial symmetry    EKG:  EKG is ordered today. The ekg ordered today demonstrates    Recent Labs: 03/28/2015: ALT 21; B Natriuretic Peptide 569.8*; Hemoglobin 13.7; Platelets 285 04/28/2015: BUN 19; Creatinine, Ser 1.06; Potassium 4.1; Sodium 139    Lipid Panel    Component Value Date/Time   CHOL 126 05/23/2013 0500   TRIG 57 05/23/2013 0500   HDL 57 05/23/2013 0500   CHOLHDL 2.2 05/23/2013 0500   VLDL 11 05/23/2013 0500   LDLCALC 58 05/23/2013 0500   LDLDIRECT 91.4 06/29/2011 1340       Other studies Reviewed: Additional studies/ records that were  reviewed today include: .   ASSESSMENT AND PLAN:  1.  SOB   2, CAD: Stable. No angina. Continue current meds.   3. Atrial fibrillation: Rate controlled on digoxin. Coreg stopped due to bradycardia in December 2015. She is not on anti-coagulation after long discussions regarding this with pt and family. Risk of anti-coagulation felt to outweigh potential benefit given advanced age and dementia.   4. Chronic systolic CHF: LVEF is 40-45%. Will continue Lasix 80 mg po BID. Check BMET today. Repeat BMET in 3 weeks.   5. Non-ischemic Cardiomyopathy: Medical management. Not ICD candidate given advanced age and dementia.   6. Severe dementia: She is a DNR. Dementia seems to have progressed since I last saw her    Current medicines are reviewed with the patient today.  The patient Has no concerns regarding medicines.  The following changes have been made:  See above Labs/ tests ordered today include:see above  Disposition:   FU:  see above  Nyoka Lint, NP  08/02/2015 5:33 PM    [facility] [address] [place], [place], [place]  [address]/ [address] [place], [place] Phone: [phone]; Fax: [phone]  [phone]   This encounter was created in error - please disregard.

## 2015-08-03 ENCOUNTER — Encounter: Payer: Medicare Other | Admitting: Cardiology

## 2015-08-06 ENCOUNTER — Other Ambulatory Visit (INDEPENDENT_AMBULATORY_CARE_PROVIDER_SITE_OTHER): Payer: Medicare Other | Admitting: *Deleted

## 2015-08-06 ENCOUNTER — Other Ambulatory Visit: Payer: Self-pay | Admitting: Family Medicine

## 2015-08-06 DIAGNOSIS — I5043 Acute on chronic combined systolic (congestive) and diastolic (congestive) heart failure: Secondary | ICD-10-CM | POA: Diagnosis not present

## 2015-08-06 DIAGNOSIS — I10 Essential (primary) hypertension: Secondary | ICD-10-CM

## 2015-08-06 LAB — BASIC METABOLIC PANEL
BUN: 23 mg/dL (ref 7–25)
CHLORIDE: 100 mmol/L (ref 98–110)
CO2: 28 mmol/L (ref 20–31)
CREATININE: 1.14 mg/dL — AB (ref 0.60–0.88)
Calcium: 9.1 mg/dL (ref 8.6–10.4)
Glucose, Bld: 112 mg/dL — ABNORMAL HIGH (ref 65–99)
POTASSIUM: 4.1 mmol/L (ref 3.5–5.3)
Sodium: 139 mmol/L (ref 135–146)

## 2015-08-06 NOTE — Telephone Encounter (Signed)
Last seen 01/21/15 and filled 06/2015 #30 No UDS.   Please advise     KP

## 2015-08-09 ENCOUNTER — Telehealth: Payer: Self-pay | Admitting: Cardiovascular Disease

## 2015-08-09 NOTE — Telephone Encounter (Signed)
New Message    Pt's son calling to get lab results. Please call back.

## 2015-08-09 NOTE — Telephone Encounter (Signed)
Reviewed BMP results with Dr. Patty Sermons and lab work is stable.  I spoke with pt's son and gave him this information.  He reports pt is feeling better.  He will make sure she has returned to normal lasix dose.

## 2015-08-12 ENCOUNTER — Encounter: Payer: Self-pay | Admitting: Cardiology

## 2015-08-14 ENCOUNTER — Telehealth: Payer: Self-pay | Admitting: Physician Assistant

## 2015-08-14 ENCOUNTER — Emergency Department (HOSPITAL_COMMUNITY): Payer: Medicare Other

## 2015-08-14 ENCOUNTER — Emergency Department (HOSPITAL_COMMUNITY)
Admission: EM | Admit: 2015-08-14 | Discharge: 2015-08-14 | Disposition: A | Discharge status: Home / Self Care | Payer: Medicare Other | Attending: Emergency Medicine | Admitting: Emergency Medicine

## 2015-08-14 ENCOUNTER — Encounter (HOSPITAL_COMMUNITY): Payer: Self-pay | Admitting: Nurse Practitioner

## 2015-08-14 DIAGNOSIS — F0390 Unspecified dementia without behavioral disturbance: Secondary | ICD-10-CM | POA: Diagnosis not present

## 2015-08-14 DIAGNOSIS — N183 Chronic kidney disease, stage 3 (moderate): Secondary | ICD-10-CM | POA: Insufficient documentation

## 2015-08-14 DIAGNOSIS — M1990 Unspecified osteoarthritis, unspecified site: Secondary | ICD-10-CM | POA: Diagnosis not present

## 2015-08-14 DIAGNOSIS — Z8639 Personal history of other endocrine, nutritional and metabolic disease: Secondary | ICD-10-CM | POA: Insufficient documentation

## 2015-08-14 DIAGNOSIS — Z7982 Long term (current) use of aspirin: Secondary | ICD-10-CM | POA: Insufficient documentation

## 2015-08-14 DIAGNOSIS — Z791 Long term (current) use of non-steroidal anti-inflammatories (NSAID): Secondary | ICD-10-CM | POA: Insufficient documentation

## 2015-08-14 DIAGNOSIS — I2510 Atherosclerotic heart disease of native coronary artery without angina pectoris: Secondary | ICD-10-CM | POA: Insufficient documentation

## 2015-08-14 DIAGNOSIS — I129 Hypertensive chronic kidney disease with stage 1 through stage 4 chronic kidney disease, or unspecified chronic kidney disease: Secondary | ICD-10-CM | POA: Insufficient documentation

## 2015-08-14 DIAGNOSIS — R0602 Shortness of breath: Secondary | ICD-10-CM | POA: Diagnosis not present

## 2015-08-14 DIAGNOSIS — I4891 Unspecified atrial fibrillation: Secondary | ICD-10-CM | POA: Diagnosis not present

## 2015-08-14 DIAGNOSIS — I5043 Acute on chronic combined systolic (congestive) and diastolic (congestive) heart failure: Secondary | ICD-10-CM

## 2015-08-14 DIAGNOSIS — Z79899 Other long term (current) drug therapy: Secondary | ICD-10-CM | POA: Insufficient documentation

## 2015-08-14 HISTORY — DX: Heart failure, unspecified: I50.9

## 2015-08-14 LAB — BASIC METABOLIC PANEL
ANION GAP: 12 (ref 5–15)
BUN: 23 mg/dL — ABNORMAL HIGH (ref 6–20)
CALCIUM: 9.2 mg/dL (ref 8.9–10.3)
CHLORIDE: 99 mmol/L — AB (ref 101–111)
CO2: 26 mmol/L (ref 22–32)
CREATININE: 1.14 mg/dL — AB (ref 0.44–1.00)
GFR calc non Af Amer: 43 mL/min — ABNORMAL LOW (ref >60)
GFR, EST AFRICAN AMERICAN: 49 mL/min — AB (ref >60)
GLUCOSE: 109 mg/dL — AB (ref 65–99)
Potassium: 4.4 mmol/L (ref 3.5–5.1)
Sodium: 137 mmol/L (ref 135–145)

## 2015-08-14 LAB — BRAIN NATRIURETIC PEPTIDE: B NATRIURETIC PEPTIDE 5: 752.7 pg/mL — AB (ref 0.0–100.0)

## 2015-08-14 LAB — CBC
HCT: 45.6 % (ref 36.0–46.0)
HEMOGLOBIN: 14.8 g/dL (ref 12.0–15.0)
MCH: 29.6 pg (ref 26.0–34.0)
MCHC: 32.5 g/dL (ref 30.0–36.0)
MCV: 91.2 fL (ref 78.0–100.0)
Platelets: 301 10*3/uL (ref 150–400)
RBC: 5 MIL/uL (ref 3.87–5.11)
RDW: 14.9 % (ref 11.5–15.5)
WBC: 12.6 10*3/uL — ABNORMAL HIGH (ref 4.0–10.5)

## 2015-08-14 LAB — DIGOXIN LEVEL: Digoxin Level: 1.2 ng/mL (ref 0.8–2.0)

## 2015-08-14 LAB — I-STAT TROPONIN, ED: Troponin i, poc: 0.08 ng/mL (ref 0.00–0.08)

## 2015-08-14 MED ORDER — FUROSEMIDE 10 MG/ML IJ SOLN
120.0000 mg | Freq: Once | INTRAVENOUS | Status: AC
  Administered 2015-08-14: 120 mg via INTRAVENOUS
  Filled 2015-08-14: qty 12

## 2015-08-14 MED ORDER — FUROSEMIDE 40 MG PO TABS: 120.0000 mg | ORAL_TABLET | Freq: Two times a day (BID) | ORAL | Status: DC

## 2015-08-14 NOTE — ED Notes (Addendum)
Family brought pt for irregular breathing today, states she keeps "breathing hard and gasping for air" since this morning. Reports occasional dry cough this week, the pt did not sleep well last night and aid states the pt was sweaty several times this week. She has dementia with 24/7 care at home. She is alert, mild labored breathing at rest. Denies pain. Daughter gave pt regular dose of lasix today

## 2015-08-14 NOTE — Discharge Instructions (Signed)
It was my pleasure taking care of you today!  Medications: Lasix 120 mg BID until you see cardiologist; continue usual home medications  Follow Up: Please follow up with cardiology within 3 days for discussion of your diagnoses and further evaluation after today's visit. Follow up with PCP as well.  Please return to the ER for any trouble breathing, new or worsening symptoms, or any additional concerns.

## 2015-08-14 NOTE — Telephone Encounter (Signed)
[name] is a 81 y.o. female with systolic HF, dementia, DNR. Her daughter called in reporting increased LE swelling and + PND. Patient is non-communicative but has been calling out for her husband (who has been deceased for a while). She is visibly short of breath at rest. As patient is a DNR, I am hesitant to send her to the ED. However, it sounds as though she is in distress at rest and would likely benefit from a dose of IV Lasix to improve her symptoms. I have advised her daughter to take her to [facility] ED for further evaluation. She agrees with this plan. Tereso Newcomer, PA-C   08/14/2015 1:36 PM

## 2015-08-14 NOTE — ED Provider Notes (Signed)
CSN: [id number]     Arrival date & time 08/14/15  1457 History   First MD Initiated Contact with Patient 08/14/15 1558     Chief Complaint  Patient presents with  . Shortness of Breath     (Consider location/radiation/quality/duration/timing/severity/associated sxs/prior Treatment) The history is provided by a relative and medical records. The history is limited by the condition of the patient. No language interpreter was used.   Level 5 caveat applies due to dementia.  Per family, patient was having increased effort in breathing and her pattern of breathing was irregular starting this morning. Pt. Normally sleeps through the night, however, last night she woke up at 3 am and did not return to sleep.   Past Medical History  Diagnosis Date  . Hypertension   . Osteoporosis   . Overactive bladder   . Chronic combined systolic and diastolic CHF (congestive heart failure) (HCC) 05/20/2013    a. Echo (8/14):  mild LVH, EF 35%, diff HK, mild MR. EF 50% by cath. b. Echo 03/2014: EF 40-45%.  Marland Kitchen Permanent atrial fibrillation (HCC)     not a coumadin candidate  . Dementia   . Arthritis   . CAD (coronary artery disease)     a. LHC (8/14):  prox to mid LAD 80, pRCA 30, pPDA 30-40, EF 50%  . Hyponatremia   . NICM (nonischemic cardiomyopathy) (HCC)   . CKD (chronic kidney disease), stage III   . CHF (congestive heart failure) (HCC)   . Dementia   . Atrial fibrillation [facility])    Past Surgical History  Procedure Laterality Date  . Abdominal hysterectomy      TAH BSO  . Spine surgery  1980    Lumbar surgery  . Left heart catheterization with coronary angiogram N/A 05/22/2013    Procedure: LEFT HEART CATHETERIZATION WITH CORONARY ANGIOGRAM;  Surgeon: Tonny Bollman, MD;  Location: [facility] CATH LAB;  Service: Cardiovascular;  Laterality: N/A;   Family History  Problem Relation Age of Onset  . Depression    . Heart attack    . Coronary artery disease    . Coronary artery disease Mother 14  .  Heart attack Son 60   Social History  Substance Use Topics  . Smoking status: Never Smoker   . Smokeless tobacco: Never Used  . Alcohol Use: No   OB History    No data available     Review of Systems  Unable to perform ROS: Dementia  Respiratory: Positive for shortness of breath. Negative for cough, wheezing and stridor.       Allergies  Review of patient's allergies indicates no known allergies.  Home Medications   Prior to Admission medications   Medication Sig Start Date End Date Taking? Authorizing Provider  acetaminophen (TYLENOL) 500 MG tablet Take 250 mg by mouth every 6 (six) hours as needed for moderate pain.    Historical Provider, MD  aspirin EC 81 MG EC tablet Take 1 tablet (81 mg total) by mouth daily. 05/29/13   Jeralyn Bennett, MD  atorvastatin (LIPITOR) 20 MG tablet TAKE 1 TABLET BY MOUTH DAILY AT [facility] 09/22/14   Kathleene Hazel, MD  clonazePAM (KLONOPIN) 0.5 MG tablet TAKE 1 TABLET BY MOUTH THREE TIMES DAILY AS NEEDED FOR ANXIETY OR SLEEP 08/06/15   Sheliah Hatch, MD  diclofenac sodium (VOLTAREN) 1 % GEL Apply 2 g topically 4 (four) times daily. 08/25/14   Yvonne R Lowne, DO  DIGOX 125 MCG tablet TAKE 1 TABLET BY MOUTH  EVERY DAY 04/20/15   Kathleene Hazel, MD  donepezil (ARICEPT) 5 MG tablet Take 1 tablet (5 mg total) by mouth at bedtime. 10/08/14   Lelon Perla, DO  furosemide (LASIX) 40 MG tablet Take 3 tablets (120 mg total) by mouth 2 (two) times daily. 08/14/15   Chase Picket Zaidin Blyden, PA-C  oxybutynin (DITROPAN-XL) 5 MG 24 hr tablet Take 1 tablet (5 mg total) by mouth daily. 11/30/14   Lelon Perla, DO  polyethylene glycol powder (MIRALAX) powder Take 17 g by mouth daily.     Historical Provider, MD  potassium chloride SA (K-DUR,KLOR-CON) 20 MEQ tablet Take 1 tablet (20 mEq total) by mouth 2 (two) times daily. 04/21/15   Ok Anis, NP  PROAIR HFA 108 (90 BASE) MCG/ACT inhaler INHALE 2 PUFFS INTO LUNGS EVERY 6 HOURS AS NEEDED FOR  WHEEZING OR SHORTNESS OF BREATH 02/18/15   Grayling Congress Lowne, DO  QUEtiapine (SEROQUEL) 100 MG tablet TAKE 1 TABLET BY MOUTH EVERY NIGHT AT BEDTIME 07/16/15   Yvonne R Lowne, DO   BP 114/59 mmHg  Pulse 57  Temp(Src) 98.1 F (36.7 C) (Oral)  Resp 26  SpO2 97% Physical Exam  Constitutional:  No acute distress.   HENT:  Head: Normocephalic and atraumatic.  Cardiovascular: Regular rhythm, normal heart sounds and intact distal pulses.  Exam reveals no gallop and no friction rub.   No murmur heard. Huston Foley but regular  Pulmonary/Chest: Breath sounds normal. No respiratory distress. She has no wheezes. She has no rales. She exhibits no tenderness.  Increased effort in breathing, mild nasal flaring.   Abdominal: She exhibits no mass. There is no rebound and no guarding.  Abdomen soft, non-tender, non-distended Bowel sounds positive in all four quadrants   Musculoskeletal: She exhibits no edema.  Neurological:  Pt. Is non-verbal at baseline; awake but does not follow commands well.   Skin: Skin is warm and dry. No rash noted. She is not diaphoretic.  Psychiatric: She has a normal mood and affect. Her behavior is normal. Judgment and thought content normal.  Nursing note and vitals reviewed.   ED Course  Procedures (including critical care time) Labs Review Labs Reviewed  BASIC METABOLIC PANEL - Abnormal; Notable for the following:    Chloride 99 (*)    Glucose, Bld 109 (*)    BUN 23 (*)    Creatinine, Ser 1.14 (*)    GFR calc non Af Amer 43 (*)    GFR calc Af Amer 49 (*)    All other components within normal limits  CBC - Abnormal; Notable for the following:    WBC 12.6 (*)    All other components within normal limits  BRAIN NATRIURETIC PEPTIDE - Abnormal; Notable for the following:    B Natriuretic Peptide 752.7 (*)    All other components within normal limits  DIGOXIN LEVEL  URINALYSIS, ROUTINE W REFLEX MICROSCOPIC (NOT AT [facility])  Rosezena Sensor, ED    Imaging Review Dg  Chest 2 View  08/14/2015  CLINICAL DATA:  Shortness of breath today.  Initial encounter. EXAM: CHEST  2 VIEW COMPARISON:  PA and lateral chest 03/28/2015 and 01/21/2015. New CT chest 12/02/2013. FINDINGS: There is cardiomegaly without pulmonary edema. The lungs are clear. No pneumothorax or pleural effusion. Aortic atherosclerosis is noted. Remote right rib fractures are seen. Thoracic and lumbar spondylosis is noted. IMPRESSION: Cardiomegaly without acute disease. Atherosclerosis. Remote right rib fractures. Electronically Signed   By: Drusilla Kanner M.D.   On:  08/14/2015 16:38   I have personally reviewed and evaluated these images and lab results as part of my medical decision-making.   EKG Interpretation   Date/Time:  Saturday August 14 2015 15:05:49 EST Ventricular Rate:  66 PR Interval:    QRS Duration: 108 QT Interval:  394 QTC Calculation: 413 R Axis:   -33 Text Interpretation:  Atrial fibrillation Left axis deviation Marked ST  abnormality, possible lateral subendocardial injury Abnormal ECG Atrial  fibrillation Left axis deviation ST-t wave abnormality Artifact No  significant change since last tracing Abnormal ekg Confirmed by Gerhard Munch  MD [id number]) on 08/14/2015 4:22:53 PM      MDM   Final diagnoses:  Acute on chronic combined systolic and diastolic congestive heart failure (HCC)   Talbert Cage was brought by family for increased effort in breathing. Under 24/7 home care.    Does not appear volume overloaded on exam. Labs: Trop 0.08, white count 12.6,  CXR shows cardiomegaly without acute disease; no pneumo, no PNA, no effusion.  BNP 752 - breathing difficulties likely 2/2 worsening CHF.   Discussed admission vs. Home care with family who state she would prefer to be at home. They have 24/7 home nursing care who can monitor patient. Lasix will be increased to 120 mg BID until the can follow up with cardiologist, preferably before Tuesday. Stressed the  importance of PCP and cardio follow up. Family understands and agrees with plan.    [facility] Roza Creamer, PA-C 08/14/15 2010  Gerhard Munch, MD 08/14/15 2300

## 2015-08-16 ENCOUNTER — Other Ambulatory Visit: Payer: Self-pay | Admitting: Family Medicine

## 2015-08-16 ENCOUNTER — Telehealth: Payer: Self-pay | Admitting: Cardiovascular Disease

## 2015-08-16 MED ORDER — POTASSIUM CHLORIDE CRYS ER 20 MEQ PO TBCR: 20.0000 meq | EXTENDED_RELEASE_TABLET | Freq: Two times a day (BID) | ORAL | Status: DC

## 2015-08-16 NOTE — Telephone Encounter (Signed)
Pt's medication was sent to pt's pharmacy. Confirmation received °

## 2015-08-16 NOTE — Telephone Encounter (Signed)
New message       *STAT* If patient is at the pharmacy, call can be transferred to refill team.   1. Which medications need to be refilled? (please list name of each medication and dose if known) potassium chloride  2. Which pharmacy/location (including street and city if local pharmacy) is medication to be sent to? [facility]/[place] [phone]  3. Do they need a 30 day or 90 day supply? 30

## 2015-08-16 NOTE — Telephone Encounter (Signed)
Thanks

## 2015-08-19 ENCOUNTER — Telehealth: Payer: Self-pay | Admitting: *Deleted

## 2015-08-19 NOTE — Telephone Encounter (Signed)
Pt was recently seen in ED and is to follow up with cardiology.  I spoke with pt's son. He reports at this time pt is comfortable.  Appt made for pt to see Tereso Newcomer, PA on 08/23/15 at 11:30 for ED follow up.

## 2015-08-22 NOTE — Progress Notes (Signed)
Cardiology Office Note   Date:  08/23/2015   ID:  [name], DOB March 27, 1928, MRN [id number]   Patient Care Team: Lelon Perla, DO as PCP - General Kathleene Hazel, MD as Consulting Physician (Cardiology)    Chief Complaint  Patient presents with  . Hospitalization Follow-up    Visit to ED 08/14/15  . Congestive Heart Failure     History of Present Illness: [name] is a 81 y.o. female with a hx of HTN, persistent atrial fibrillation, severe dementia, CAD, NICM, Systolic HF.[name] She was admitted to [facility] 05/2013 with volume overload, atrial fibrillation with RVR. Echo demonstrated an EF 35% and LHC demonstrated an 80% proximal LAD stenosis, 30% RCA stenosis. Medical management was recommended. She was diuresed with IV Lasix and was rate controlled with low dose Coreg and Digoxin.  She was not felt to be a good candidate for long term anti-coagulation given advanced age and dementia. Admitted again 12/15 with bradycardia and mental status changes. There is no evidence of TIA or CVA. Beta blocker was stopped with improved heart rate. Admitted 6/16 with volume overload and reduced urine output. She had minimally elevated troponin felt to be related to demand ischemia. Follow-up echo demonstrated EF 40-45% with anteroseptal hypokinesis. Last seen by Dr. Clifton James 6/16.  Patient's daughter called in to the answering service on 11/12. Patient seemed to exhibit signs of volume excess and she was referred to the emergency room. Chest x-ray was negative for CHF. Home dose of Lasix was adjusted. She returns for follow-up.    Here today with her son and caretaker. She seems to be feeling better.  She has less shortness of breath and is sleeping through the night.  No syncope.  LE edema is better.  She is not wheezing.  She uses her inhaler about once a day.  Her caretaker Malachi Paradise) seems to think she used this less when she was taking the higher dose of Lasix.     Studies/Reports  Reviewed Today:  Echo 03/29/15 EF 40-45%, anteroseptal HK  Echo 05/21/13 Dilated LV cavity, LVEF 35%, mild MR.   Cardiac cath 05/22/13 80% proximal LAD stenosis,  30% RCA stenosis.  Medical management was recommended.  Past Medical History  Diagnosis Date  . Hypertension   . Osteoporosis   . Overactive bladder   . Chronic combined systolic and diastolic CHF (congestive heart failure) (HCC) 05/20/2013    a. Echo (8/14):  mild LVH, EF 35%, diff HK, mild MR. EF 50% by cath. b. Echo 03/2014: EF 40-45%.  [name] Permanent atrial fibrillation (HCC)     not a coumadin candidate  . Dementia   . Arthritis   . CAD (coronary artery disease)     a. LHC (8/14):  prox to mid LAD 80, pRCA 30, pPDA 30-40, EF 50%  . Hyponatremia   . NICM (nonischemic cardiomyopathy) (HCC)   . CKD (chronic kidney disease), stage III   . CHF (congestive heart failure) (HCC)   . Dementia   . Atrial fibrillation [facility])     Past Surgical History  Procedure Laterality Date  . Abdominal hysterectomy      TAH BSO  . Spine surgery  1980    Lumbar surgery  . Left heart catheterization with coronary angiogram N/A 05/22/2013    Procedure: LEFT HEART CATHETERIZATION WITH CORONARY ANGIOGRAM;  Surgeon: Tonny Bollman, MD;  Location: [facility] CATH LAB;  Service: Cardiovascular;  Laterality: N/A;     Current Outpatient Prescriptions  Medication  Sig Dispense Refill  . acetaminophen (TYLENOL) 500 MG tablet Take 250 mg by mouth every 6 (six) hours as needed for moderate pain.    [name] aspirin EC 81 MG EC tablet Take 1 tablet (81 mg total) by mouth daily. 30 tablet 0  . atorvastatin (LIPITOR) 20 MG tablet TAKE 1 TABLET BY MOUTH DAILY AT 6PM 30 tablet 0  . clonazePAM (KLONOPIN) 0.5 MG tablet TAKE 1 TABLET BY MOUTH THREE TIMES DAILY AS NEEDED FOR ANXIETY OR SLEEP 30 tablet 0  . diclofenac sodium (VOLTAREN) 1 % GEL APPLY 2 GRAMS TO THE AFFECTED AREA FOUR TIMES DAILY 100 g 0  . DIGOX 125 MCG tablet TAKE 1 TABLET BY MOUTH EVERY DAY 30 tablet 5    . donepezil (ARICEPT) 5 MG tablet Take 1 tablet (5 mg total) by mouth at bedtime. 30 tablet 11  . furosemide (LASIX) 40 MG tablet Take 3 tablets (120 mg total) by mouth as directed. 120 mg on M, W, and Fri's all other days take 80 mg twice daily 90 tablet 3  . oxybutynin (DITROPAN-XL) 5 MG 24 hr tablet Take 1 tablet (5 mg total) by mouth daily. 30 tablet 11  . polyethylene glycol powder (MIRALAX) powder Take 17 g by mouth daily.     . potassium chloride SA (K-DUR,KLOR-CON) 20 MEQ tablet Take 1 tablet (20 mEq total) by mouth as directed. 60 tablet 1  . PROAIR HFA 108 (90 BASE) MCG/ACT inhaler INHALE 2 PUFFS INTO LUNGS EVERY 6 HOURS AS NEEDED FOR WHEEZING OR SHORTNESS OF BREATH 8.5 g 2  . QUEtiapine (SEROQUEL) 100 MG tablet TAKE 1 TABLET BY MOUTH EVERY NIGHT AT BEDTIME 30 tablet 5   No current facility-administered medications for this visit.    Allergies:   Review of patient's allergies indicates no known allergies.    Social History:   Social History   Social History  . Marital Status: Married    Spouse Name: N/A  . Number of Children: N/A  . Years of Education: N/A   Occupational History  . retired     Engineer, civil (consulting)  . volunteers at hospice    Social History Main Topics  . Smoking status: Never Smoker   . Smokeless tobacco: Never Used  . Alcohol Use: No  . Drug Use: No  . Sexual Activity: No   Other Topics Concern  . None   Social History Narrative   Pt lives at home with around the clock care     Family History:   Family History  Problem Relation Age of Onset  . Depression    . Heart attack    . Coronary artery disease    . Coronary artery disease Mother 66  . Heart attack Son 51      ROS:   Please see the history of present illness.   Review of Systems  Cardiovascular: Positive for leg swelling.  Respiratory: Positive for shortness of breath.   All other systems reviewed and are negative.     PHYSICAL EXAM: VS:  BP 120/70 mmHg  Pulse 73  Ht  (1.626  m)  Wt 140 lb 12.8 oz (63.866 kg)  BMI 24.16 kg/m2    Wt Readings from Last 3 Encounters:  08/23/15 140 lb 12.8 oz (63.866 kg)  03/31/15 136 lb (61.689 kg)  03/28/15 136 lb 11.2 oz (62.007 kg)     GEN: Well nourished, well developed, in no acute distress HEENT: normal Neck: no JVD at 90 degrees,  no masses  Cardiac:  Normal S1/S2, irregularly irregular rhythm; no murmur ,  no rubs or gallops, trace bilateral ankle edema   Respiratory:  clear to auscultation bilaterally, no wheezing, rhonchi or rales. GI:  Distended  MS: no deformity or atrophy Skin: warm and dry  Neuro:  CNs II-XII intact, Strength and sensation are intact Psych: Normal affect   EKG:  EKG is ordered today.  It demonstrates:   AFib, HR 73, TWI 1, aVL, QTc 418 ms, no change from prior tracings.    Recent Labs: 03/28/2015: ALT 21 08/14/2015: B Natriuretic Peptide 752.7*; BUN 23*; Creatinine, Ser 1.14*; Hemoglobin 14.8; Platelets 301; Potassium 4.4; Sodium 137    Lipid Panel    Component Value Date/Time   CHOL 126 05/23/2013 0500   TRIG 57 05/23/2013 0500   HDL 57 05/23/2013 0500   CHOLHDL 2.2 05/23/2013 0500   VLDL 11 05/23/2013 0500   LDLCALC 58 05/23/2013 0500   LDLDIRECT 91.4 06/29/2011 1340      ASSESSMENT AND PLAN:  1. Chronic Systolic CHF:  Volume overall appears stable.  She is better after several days on the higher dose of Lasix. I will have her take Lasix 120 mg bid on Monday, Wednesday, Friday with an extra 1/2 dose of K+.  She will remain on Lasix 80 mg bid on all other days.  BMET today and repeat in 2-3 weeks.   2. CAD: No apparent angina. Continue conservative management. Continue ASA and statin.  3. Atrial Fibrillation: Rate controlled. Not a coumadin candidate.  4. NICM:  Not a candidate for ICD.   5. Dementia:  Severe.  She is DNR.        Medication Changes: Current medicines are reviewed at length with the patient today.  Concerns regarding medicines are as outlined  above.  The following changes have been made:   Discontinued Medications   No medications on file   Modified Medications   Modified Medication Previous Medication   FUROSEMIDE (LASIX) 40 MG TABLET furosemide (LASIX) 40 MG tablet      Take 3 tablets (120 mg total) by mouth as directed. 120 mg on M, W, and Fri's all other days take 80 mg twice daily    Take 3 tablets (120 mg total) by mouth 2 (two) times daily.   POTASSIUM CHLORIDE SA (K-DUR,KLOR-CON) 20 MEQ TABLET potassium chloride SA (K-DUR,KLOR-CON) 20 MEQ tablet      Take 1 tablet (20 mEq total) by mouth as directed.    Take 1 tablet (20 mEq total) by mouth 2 (two) times daily.   New Prescriptions   No medications on file   Labs/ tests ordered today include:   Orders Placed This Encounter  Procedures  . Basic Metabolic Panel (BMET)  . Basic Metabolic Panel (BMET)  . EKG 12-Lead     Disposition:    FU with Dr. [name] 6 mos.     Signed, Brynda Rim, MHS 08/23/2015 12:23 PM    [facility] [address] [place], [place], [place]  [address] Phone: [phone]; Fax: [phone]

## 2015-08-23 ENCOUNTER — Encounter: Payer: Self-pay | Admitting: Physician Assistant

## 2015-08-23 ENCOUNTER — Ambulatory Visit (INDEPENDENT_AMBULATORY_CARE_PROVIDER_SITE_OTHER): Payer: Medicare Other | Admitting: Physician Assistant

## 2015-08-23 ENCOUNTER — Other Ambulatory Visit: Payer: Self-pay | Admitting: *Deleted

## 2015-08-23 VITALS — BP 120/70 | HR 73 | Ht 64.0 in | Wt 140.8 lb

## 2015-08-23 DIAGNOSIS — I4819 Other persistent atrial fibrillation: Secondary | ICD-10-CM

## 2015-08-23 DIAGNOSIS — I429 Cardiomyopathy, unspecified: Secondary | ICD-10-CM | POA: Diagnosis not present

## 2015-08-23 DIAGNOSIS — F03918 Unspecified dementia, unspecified severity, with other behavioral disturbance: Secondary | ICD-10-CM

## 2015-08-23 DIAGNOSIS — F0391 Unspecified dementia with behavioral disturbance: Secondary | ICD-10-CM

## 2015-08-23 DIAGNOSIS — I2510 Atherosclerotic heart disease of native coronary artery without angina pectoris: Secondary | ICD-10-CM

## 2015-08-23 DIAGNOSIS — I481 Persistent atrial fibrillation: Secondary | ICD-10-CM | POA: Diagnosis not present

## 2015-08-23 DIAGNOSIS — I5022 Chronic systolic (congestive) heart failure: Secondary | ICD-10-CM

## 2015-08-23 DIAGNOSIS — I428 Other cardiomyopathies: Secondary | ICD-10-CM

## 2015-08-23 LAB — BASIC METABOLIC PANEL
BUN: 26 mg/dL — AB (ref 7–25)
CALCIUM: 9.2 mg/dL (ref 8.6–10.4)
CO2: 28 mmol/L (ref 20–31)
Chloride: 100 mmol/L (ref 98–110)
Creat: 1.08 mg/dL — ABNORMAL HIGH (ref 0.60–0.88)
GLUCOSE: 111 mg/dL — AB (ref 65–99)
Potassium: 4.1 mmol/L (ref 3.5–5.3)
SODIUM: 140 mmol/L (ref 135–146)

## 2015-08-23 MED ORDER — FUROSEMIDE 40 MG PO TABS: 120.0000 mg | ORAL_TABLET | ORAL | Status: DC

## 2015-08-23 MED ORDER — POTASSIUM CHLORIDE CRYS ER 20 MEQ PO TBCR: 20.0000 meq | EXTENDED_RELEASE_TABLET | ORAL | Status: DC

## 2015-08-23 NOTE — Patient Instructions (Signed)
Medication Instructions:  1. ON MON, WED AND FRI'S TAKE LASIX 120 MG TWICE DAILY 2. ON ALL OTHER DAYS YOU WILL TAKE LASIX 80 MG TWICE DAILY  3. ON THE DAYS WHEN YOU TAKE LASIX 120 MG TWICE DAILY YOU WILL ALSO NEED TO TAKE AN EXTRA 1/2 TABLET OF YOUR POTASSIUM   Labwork: TODAY BMET  2. BMET IN 2-3 WEEKS   Testing/Procedures: NONE  Follow-Up: 6 MONTHS WITH DR. Clifton James  Any Other Special Instructions Will Be Listed Below (If Applicable).     If you need a refill on your cardiac medications before your next appointment, please call your pharmacy.

## 2015-08-25 ENCOUNTER — Telehealth: Payer: Self-pay

## 2015-08-25 NOTE — Telephone Encounter (Signed)
spoke with pts son about recent lab results. pot son had no questions and verbalized understanding for his mother to continue her current treatment plan.

## 2015-08-25 NOTE — Telephone Encounter (Signed)
-----   Message from Beatrice Lecher, [place] sent at 08/24/2015  5:51 PM EST ----- Creatinine stable K+ ok Continue with current treatment plan. Tereso Newcomer, PA-C   08/24/2015 5:51 PM

## 2015-09-01 ENCOUNTER — Telehealth: Payer: Self-pay | Admitting: Cardiovascular Disease

## 2015-09-01 NOTE — Telephone Encounter (Signed)
Pt's daughter calling to verify dosage on Lasix, she is under the impression she's to take 120 mg twice a day rather than a total of 120mg  as her med list states-pls call to advise [phone] dtr Nicholos Johns

## 2015-09-01 NOTE — Telephone Encounter (Signed)
I spoke with pt's daughter and clarified Lasix instructions with her.  Pt is taking as instructed at last office visit.

## 2015-09-08 ENCOUNTER — Other Ambulatory Visit: Payer: Self-pay | Admitting: Family Medicine

## 2015-09-14 ENCOUNTER — Other Ambulatory Visit: Payer: Medicare Other

## 2015-09-14 ENCOUNTER — Ambulatory Visit (INDEPENDENT_AMBULATORY_CARE_PROVIDER_SITE_OTHER): Payer: Medicare Other | Admitting: Family Medicine

## 2015-09-14 ENCOUNTER — Telehealth: Payer: Self-pay | Admitting: Family Medicine

## 2015-09-14 ENCOUNTER — Encounter: Payer: Self-pay | Admitting: Family Medicine

## 2015-09-14 VITALS — BP 124/80 | HR 71 | Temp 97.5°F | Wt 140.8 lb

## 2015-09-14 DIAGNOSIS — F419 Anxiety disorder, unspecified: Secondary | ICD-10-CM

## 2015-09-14 DIAGNOSIS — F03918 Unspecified dementia, unspecified severity, with other behavioral disturbance: Secondary | ICD-10-CM

## 2015-09-14 DIAGNOSIS — I5043 Acute on chronic combined systolic (congestive) and diastolic (congestive) heart failure: Secondary | ICD-10-CM | POA: Diagnosis not present

## 2015-09-14 DIAGNOSIS — I5033 Acute on chronic diastolic (congestive) heart failure: Secondary | ICD-10-CM

## 2015-09-14 DIAGNOSIS — F0391 Unspecified dementia with behavioral disturbance: Secondary | ICD-10-CM

## 2015-09-14 MED ORDER — DONEPEZIL HCL 5 MG PO TABS: 5.0000 mg | ORAL_TABLET | Freq: Every day | ORAL | Status: DC

## 2015-09-14 MED ORDER — CLONAZEPAM 0.5 MG PO TABS
ORAL_TABLET | ORAL | Status: DC
Start: 2015-09-14 — End: 2016-04-07

## 2015-09-14 MED ORDER — DIGOXIN 125 MCG PO TABS: 125.0000 ug | ORAL_TABLET | Freq: Every day | ORAL | Status: DC

## 2015-09-14 MED ORDER — ALBUTEROL SULFATE HFA 108 (90 BASE) MCG/ACT IN AERS: INHALATION_SPRAY | RESPIRATORY_TRACT | Status: AC

## 2015-09-14 NOTE — Patient Instructions (Signed)

## 2015-09-14 NOTE — Assessment & Plan Note (Signed)
Digoxin refilled because pt is almost out --- they will f/u with cardiology

## 2015-09-14 NOTE — Progress Notes (Signed)
Patient ID: [name], female    DOB: Nov 14, 1927  Age: 81 y.o. MRN: [id number]    Subjective:  Subjective HPI [name] presents for f/u hosp and dementia.  Pt seeing cardiology for chf.  Dementia is worsening.  Pt is off lipitor.  Son is present.    Review of Systems  Constitutional: Positive for appetite change. Negative for diaphoresis, fatigue and unexpected weight change.  Eyes: Negative for pain, redness and visual disturbance.  Respiratory: Negative for cough, chest tightness, shortness of breath and wheezing.   Cardiovascular: Negative for chest pain, palpitations and leg swelling.  Endocrine: Negative for cold intolerance, heat intolerance, polydipsia, polyphagia and polyuria.  Genitourinary: Negative for dysuria, frequency and difficulty urinating.  Neurological: Negative for dizziness, light-headedness, numbness and headaches.  Psychiatric/Behavioral: The patient is nervous/anxious.     History Past Medical History  Diagnosis Date  . Hypertension   . Osteoporosis   . Overactive bladder   . Chronic combined systolic and diastolic CHF (congestive heart failure) (HCC) 05/20/2013    a. Echo (8/14):  mild LVH, EF 35%, diff HK, mild MR. EF 50% by cath. b. Echo 03/2014: EF 40-45%.  Marland Kitchen Permanent atrial fibrillation (HCC)     not a coumadin candidate  . Dementia   . Arthritis   . CAD (coronary artery disease)     a. LHC (8/14):  prox to mid LAD 80, pRCA 30, pPDA 30-40, EF 50%  . Hyponatremia   . NICM (nonischemic cardiomyopathy) (HCC)   . CKD (chronic kidney disease), stage III   . CHF (congestive heart failure) (HCC)   . Dementia   . Atrial fibrillation (HCC)     She has past surgical history that includes Abdominal hysterectomy; Spine surgery (1980); and left heart catheterization with coronary angiogram (N/A, 05/22/2013).   Her family history includes Coronary artery disease in an other family member; Coronary artery disease (age of onset: 64) in her mother;  Depression in an other family member; Heart attack in an other family member; Heart attack (age of onset: 85) in her son.She reports that she has never smoked. She has never used smokeless tobacco. She reports that she does not drink alcohol or use illicit drugs.  Current Outpatient Prescriptions on File Prior to Visit  Medication Sig Dispense Refill  . acetaminophen (TYLENOL) 500 MG tablet Take 250 mg by mouth every 6 (six) hours as needed for moderate pain.    Marland Kitchen aspirin EC 81 MG EC tablet Take 1 tablet (81 mg total) by mouth daily. 30 tablet 0  . diclofenac sodium (VOLTAREN) 1 % GEL APPLY 2 GRAMS TO THE AFFECTED AREA FOUR TIMES DAILY 100 g 0  . furosemide (LASIX) 40 MG tablet Take 3 tablets (120 mg total) by mouth as directed. 120 mg on M, W, and Fri's all other days take 80 mg twice daily 90 tablet 3  . oxybutynin (DITROPAN-XL) 5 MG 24 hr tablet Take 1 tablet (5 mg total) by mouth daily. 30 tablet 11  . polyethylene glycol powder (MIRALAX) powder Take 17 g by mouth daily.     . potassium chloride SA (K-DUR,KLOR-CON) 20 MEQ tablet Take 1 tablet (20 mEq total) by mouth as directed. 120 meq twice daily; on lasix days of 120 mg twice daily take extra 1/2 of potassium 90 tablet 11  . QUEtiapine (SEROQUEL) 100 MG tablet TAKE 1 TABLET BY MOUTH EVERY NIGHT AT BEDTIME 30 tablet 5   No current facility-administered medications on file prior to visit.  Objective:  Objective Physical Exam  Constitutional: She is oriented to person, place, and time. No distress.  HENT:  Head: Normocephalic and atraumatic.  Eyes: Conjunctivae and EOM are normal.  Neck: Normal range of motion. Neck supple. No JVD present. Carotid bruit is not present. No thyromegaly present.  Cardiovascular: Normal rate, regular rhythm and normal heart sounds.   No murmur heard. Pulmonary/Chest: Effort normal and breath sounds normal. No respiratory distress. She has no wheezes. She has no rales. She exhibits no tenderness.    Musculoskeletal: She exhibits no edema.  Neurological: She is alert and oriented to person, place, and time.  Skin: She is not diaphoretic.  Psychiatric: Her mood appears anxious. She is agitated. Cognition and memory are impaired. She exhibits abnormal recent memory.   BP 124/80 mmHg  Pulse 71  Temp(Src) 97.5 F (36.4 C) (Oral)  Wt 140 lb 12.8 oz (63.866 kg)  SpO2 95% Wt Readings from Last 3 Encounters:  09/14/15 140 lb 12.8 oz (63.866 kg)  08/23/15 140 lb 12.8 oz (63.866 kg)  03/31/15 136 lb (61.689 kg)     Lab Results  Component Value Date   WBC 12.6* 08/14/2015   HGB 14.8 08/14/2015   HCT 45.6 08/14/2015   PLT 301 08/14/2015   GLUCOSE 111* 08/23/2015   CHOL 126 05/23/2013   TRIG 57 05/23/2013   HDL 57 05/23/2013   LDLDIRECT 91.4 06/29/2011   LDLCALC 58 05/23/2013   ALT 21 03/28/2015   AST 28 03/28/2015   NA 140 08/23/2015   K 4.1 08/23/2015   CL 100 08/23/2015   CREATININE 1.08* 08/23/2015   BUN 26* 08/23/2015   CO2 28 08/23/2015   TSH 1.013 05/20/2013   INR 1.03 01/20/2013    Dg Chest 2 View  08/14/2015  CLINICAL DATA:  Shortness of breath today.  Initial encounter. EXAM: CHEST  2 VIEW COMPARISON:  PA and lateral chest 03/28/2015 and 01/21/2015. New CT chest 12/02/2013. FINDINGS: There is cardiomegaly without pulmonary edema. The lungs are clear. No pneumothorax or pleural effusion. Aortic atherosclerosis is noted. Remote right rib fractures are seen. Thoracic and lumbar spondylosis is noted. IMPRESSION: Cardiomegaly without acute disease. Atherosclerosis. Remote right rib fractures. Electronically Signed   By: Drusilla Kanner M.D.   On: 08/14/2015 16:38     Assessment & Plan:  Plan I have discontinued [name]'s atorvastatin. I have also changed her PROAIR HFA to albuterol and DIGOX to digoxin. Additionally, I am having her maintain her polyethylene glycol powder, aspirin, acetaminophen, oxybutynin, QUEtiapine, diclofenac sodium, furosemide, potassium  chloride SA, clonazePAM, and donepezil.  Meds ordered this encounter  Medications  . albuterol (PROAIR HFA) 108 (90 BASE) MCG/ACT inhaler    Sig: INHALE 2 PUFFS INTO LUNGS EVERY 6 HOURS AS NEEDED FOR WHEEZING OR SHORTNESS OF BREATH    Dispense:  8.5 g    Refill:  2  . clonazePAM (KLONOPIN) 0.5 MG tablet    Sig: TAKE 1 TABLET BY MOUTH THREE TIMES DAILY AS NEEDED FOR ANXIETY OR SLEEP    Dispense:  30 tablet    Refill:  5  . donepezil (ARICEPT) 5 MG tablet    Sig: Take 1 tablet (5 mg total) by mouth at bedtime.    Dispense:  30 tablet    Refill:  11  . digoxin (DIGOX) 0.125 MG tablet    Sig: Take 1 tablet (125 mcg total) by mouth daily.    Dispense:  30 tablet    Refill:  5  Problem List Items Addressed This Visit    Dementia-not felt to be a candidate for anticoagulation (Chronic)   Relevant Medications   clonazePAM (KLONOPIN) 0.5 MG tablet   donepezil (ARICEPT) 5 MG tablet   Acute on chronic combined systolic and diastolic CHF, NYHA class 2 (HCC)    Digoxin refilled because pt is almost out --- they will f/u with cardiology      Relevant Medications   digoxin (DIGOX) 0.125 MG tablet    Other Visit Diagnoses    Anxiety disorder, unspecified anxiety disorder type    -  Primary    Relevant Medications    clonazePAM (KLONOPIN) 0.5 MG tablet    Acute on chronic diastolic CHF (congestive heart failure), NYHA class 2 (HCC)        Relevant Medications    digoxin (DIGOX) 0.125 MG tablet    Other Relevant Orders    Basic metabolic panel       Follow-up: Return in about 1 year (around 09/13/2016), or if symptoms worsen or fail to improve.  [name], DO

## 2015-09-14 NOTE — Progress Notes (Signed)
Pre visit review using our clinic review tool, if applicable. No additional management support is needed unless otherwise documented below in the visit note. 

## 2015-09-15 LAB — BASIC METABOLIC PANEL
BUN: 26 mg/dL — ABNORMAL HIGH (ref 6–23)
CALCIUM: 9.1 mg/dL (ref 8.4–10.5)
CO2: 30 mEq/L (ref 19–32)
Chloride: 99 mEq/L (ref 96–112)
Creatinine, Ser: 1.16 mg/dL (ref 0.40–1.20)
GFR: 47.1 mL/min — AB (ref >60.00)
GLUCOSE: 107 mg/dL — AB (ref 70–99)
POTASSIUM: 4.6 meq/L (ref 3.5–5.1)
SODIUM: 137 meq/L (ref 135–145)

## 2015-09-15 NOTE — Telephone Encounter (Signed)
error 

## 2015-10-04 ENCOUNTER — Other Ambulatory Visit: Payer: Self-pay | Admitting: Family Medicine

## 2015-10-05 NOTE — Telephone Encounter (Signed)
Last seen 09/17/15, Please advise      KP

## 2015-10-12 ENCOUNTER — Other Ambulatory Visit: Payer: Self-pay | Admitting: Cardiovascular Disease

## 2015-11-08 ENCOUNTER — Other Ambulatory Visit: Payer: Self-pay | Admitting: Family Medicine

## 2015-11-08 NOTE — Telephone Encounter (Signed)
Medication filled to pharmacy as requested.   

## 2015-11-15 ENCOUNTER — Other Ambulatory Visit: Payer: Self-pay

## 2015-11-15 MED ORDER — OXYBUTYNIN CHLORIDE ER 5 MG PO TB24: 5.0000 mg | ORAL_TABLET | Freq: Every day | ORAL | Status: AC

## 2015-12-02 ENCOUNTER — Ambulatory Visit (HOSPITAL_BASED_OUTPATIENT_CLINIC_OR_DEPARTMENT_OTHER)
Admission: RE | Admit: 2015-12-02 | Discharge: 2015-12-02 | Disposition: A | Discharge status: Home / Self Care | Payer: Medicare Other | Source: Ambulatory Visit | Attending: Family Medicine | Admitting: Family Medicine

## 2015-12-02 ENCOUNTER — Telehealth: Payer: Self-pay | Admitting: Family Medicine

## 2015-12-02 DIAGNOSIS — R05 Cough: Secondary | ICD-10-CM

## 2015-12-02 DIAGNOSIS — R059 Cough, unspecified: Secondary | ICD-10-CM

## 2015-12-02 DIAGNOSIS — R918 Other nonspecific abnormal finding of lung field: Secondary | ICD-10-CM | POA: Diagnosis not present

## 2015-12-02 DIAGNOSIS — I517 Cardiomegaly: Secondary | ICD-10-CM | POA: Diagnosis not present

## 2015-12-02 NOTE — Telephone Encounter (Signed)
Please advise      KP 

## 2015-12-02 NOTE — Telephone Encounter (Signed)
She will have someone bring her to [facility] for a CXR. Order in.    [place]

## 2015-12-02 NOTE — Telephone Encounter (Signed)
mucinex but sounds like she should be seen by someone----  Can someone bring her in for cxr?  ---- if [facility] is easier we can order it there

## 2015-12-02 NOTE — Telephone Encounter (Signed)
Pt daughter called in stating pt is very difficult to get around. Daughter has the flu so she has not gone to see her mom but has talked to her on the phone. Pt has a loose cough, no fever, no chills, no loss of appetite, pt drinking plenty of fluids. She is taking liquid mucinex for cold and flu at home and then her regular prescription  Medications. Is there any recommendation of how else pt can treat cough? Please advise.  Nicholos Johns, daughter [phone]

## 2015-12-07 ENCOUNTER — Telehealth: Payer: Self-pay | Admitting: Family Medicine

## 2015-12-07 NOTE — Telephone Encounter (Signed)
Caller Name: Ebert,Mark Relation to pt: son Call back number:[phone] Pharmacy: [id number] - [place], [place] - [phone] [phone] (Phone) [phone] (Fax)   Reason for call:  Daughter states patient experiencing a bad cough requesting antibiotics. Daughter states mother had a chest x ray and she's old therefore scheduling an appointment would be hard.

## 2015-12-07 NOTE — Telephone Encounter (Signed)
Son will call back to schedule but would like to see Dr. Laury Axon, advised PCP is 100% booked

## 2015-12-07 NOTE — Telephone Encounter (Signed)
Patient scheduled to come in tomorrow for appt with Esperanza Richters

## 2015-12-07 NOTE — Telephone Encounter (Signed)
She will need to be seen per [facility] 's last note.    KP

## 2015-12-08 ENCOUNTER — Encounter: Payer: Self-pay | Admitting: Medical

## 2015-12-08 ENCOUNTER — Ambulatory Visit (INDEPENDENT_AMBULATORY_CARE_PROVIDER_SITE_OTHER): Payer: Medicare Other | Admitting: Medical

## 2015-12-08 ENCOUNTER — Other Ambulatory Visit: Payer: Self-pay | Admitting: Medical

## 2015-12-08 ENCOUNTER — Ambulatory Visit (HOSPITAL_BASED_OUTPATIENT_CLINIC_OR_DEPARTMENT_OTHER)
Admission: RE | Admit: 2015-12-08 | Discharge: 2015-12-08 | Disposition: A | Discharge status: Home / Self Care | Payer: Medicare Other | Source: Ambulatory Visit | Attending: Medical | Admitting: Medical

## 2015-12-08 VITALS — BP 118/78 | HR 78 | Temp 98.1°F

## 2015-12-08 DIAGNOSIS — J209 Acute bronchitis, unspecified: Secondary | ICD-10-CM | POA: Diagnosis not present

## 2015-12-08 DIAGNOSIS — R062 Wheezing: Secondary | ICD-10-CM | POA: Diagnosis not present

## 2015-12-08 DIAGNOSIS — R05 Cough: Secondary | ICD-10-CM

## 2015-12-08 DIAGNOSIS — R059 Cough, unspecified: Secondary | ICD-10-CM

## 2015-12-08 MED ORDER — BENZONATATE 100 MG PO CAPS: 100.0000 mg | ORAL_CAPSULE | Freq: Three times a day (TID) | ORAL | Status: DC | PRN

## 2015-12-08 MED ORDER — DOXYCYCLINE HYCLATE 100 MG PO TABS: 100.0000 mg | ORAL_TABLET | Freq: Two times a day (BID) | ORAL | Status: DC

## 2015-12-08 MED ORDER — FLUTICASONE PROPIONATE HFA 110 MCG/ACT IN AERO: 2.0000 | INHALATION_SPRAY | Freq: Two times a day (BID) | RESPIRATORY_TRACT | Status: DC

## 2015-12-08 NOTE — Progress Notes (Signed)
Pre visit review using our clinic review tool, if applicable. No additional management support is needed unless otherwise documented below in the visit note. 

## 2015-12-08 NOTE — Patient Instructions (Addendum)
You appear to have bronchitis. Rest hydrate and tylenol for fever. I am prescribing cough medicine benzonatate, and doxycycline antibiotic.   For recent wheezing I am adding flovent. Continue albuterol  Get chest xray today.  Follow up in 7-10 days or as needed

## 2015-12-08 NOTE — Progress Notes (Signed)
Subjective:    Patient ID: [name], female    DOB: 06/26/1928, 82 y.o.   MRN: [id number]  HPI  Pt in with cough for one week. Pt can sleep but cough is worse at night.   No fever, no chills or sweats.  Pt had cxr last week. But was suboptimal xray. No pneumonia seen on review.   Some wheezing. More at night. Not severe.  Pt got cxr last week but was not seen. Cough has persisted. Sound like will be productive per careaker. But she is not bringing up any mucous yet.    Review of Systems  Constitutional: Negative for chills and fatigue.  HENT: Negative for congestion, ear pain, facial swelling, mouth sores, postnasal drip, rhinorrhea, sinus pressure, sneezing and sore throat.   Respiratory: Positive for cough and wheezing. Negative for shortness of breath.   Musculoskeletal: Negative for back pain.  Neurological: Negative for dizziness, syncope and headaches.  Hematological: Negative for adenopathy. Does not bruise/bleed easily.    Past Medical History  Diagnosis Date  . Hypertension   . Osteoporosis   . Overactive bladder   . Chronic combined systolic and diastolic CHF (congestive heart failure) (HCC) 05/20/2013    a. Echo (8/14):  mild LVH, EF 35%, diff HK, mild MR. EF 50% by cath. b. Echo 03/2014: EF 40-45%.  Marland Kitchen Permanent atrial fibrillation (HCC)     not a coumadin candidate  . Dementia   . Arthritis   . CAD (coronary artery disease)     a. LHC (8/14):  prox to mid LAD 80, pRCA 30, pPDA 30-40, EF 50%  . Hyponatremia   . NICM (nonischemic cardiomyopathy) (HCC)   . CKD (chronic kidney disease), stage III   . CHF (congestive heart failure) (HCC)   . Dementia   . Atrial fibrillation [facility])     Social History   Social History  . Marital Status: Married    Spouse Name: N/A  . Number of Children: N/A  . Years of Education: N/A   Occupational History  . retired     Engineer, civil (consulting)  . volunteers at hospice    Social History Main Topics  . Smoking status: Never Smoker    . Smokeless tobacco: Never Used  . Alcohol Use: No  . Drug Use: No  . Sexual Activity: No   Other Topics Concern  . Not on file   Social History Narrative   Pt lives at home with around the clock care    Past Surgical History  Procedure Laterality Date  . Abdominal hysterectomy      TAH BSO  . Spine surgery  1980    Lumbar surgery  . Left heart catheterization with coronary angiogram N/A 05/22/2013    Procedure: LEFT HEART CATHETERIZATION WITH CORONARY ANGIOGRAM;  Surgeon: Tonny Bollman, MD;  Location: [facility] CATH LAB;  Service: Cardiovascular;  Laterality: N/A;    Family History  Problem Relation Age of Onset  . Depression    . Heart attack    . Coronary artery disease    . Coronary artery disease Mother 16  . Heart attack Son 56    No Known Allergies  Current Outpatient Prescriptions on File Prior to Visit  Medication Sig Dispense Refill  . acetaminophen (TYLENOL) 500 MG tablet Take 250 mg by mouth every 6 (six) hours as needed for moderate pain.    Marland Kitchen albuterol (PROAIR HFA) 108 (90 BASE) MCG/ACT inhaler INHALE 2 PUFFS INTO LUNGS EVERY 6 HOURS AS NEEDED  FOR WHEEZING OR SHORTNESS OF BREATH 8.5 g 2  . aspirin EC 81 MG EC tablet Take 1 tablet (81 mg total) by mouth daily. 30 tablet 0  . clonazePAM (KLONOPIN) 0.5 MG tablet TAKE 1 TABLET BY MOUTH THREE TIMES DAILY AS NEEDED FOR ANXIETY OR SLEEP 30 tablet 5  . diclofenac sodium (VOLTAREN) 1 % GEL APPLY 2 GRAMS TO THE AFFECTED AREA FOUR TIMES DAILY 100 g 0  . digoxin (DIGOX) 0.125 MG tablet Take 1 tablet (125 mcg total) by mouth daily. 30 tablet 5  . donepezil (ARICEPT) 5 MG tablet TAKE 1 TABLET BY MOUTH EVERY NIGHT AT BEDTIME 30 tablet 0  . furosemide (LASIX) 40 MG tablet Take 3 tablets (120 mg total) by mouth as directed. 120 mg on M, W, and Fri's all other days take 80 mg twice daily 90 tablet 3  . oxybutynin (DITROPAN-XL) 5 MG 24 hr tablet Take 1 tablet (5 mg total) by mouth daily. 30 tablet 11  . polyethylene glycol powder  (MIRALAX) powder Take 17 g by mouth daily.     . potassium chloride SA (K-DUR,KLOR-CON) 20 MEQ tablet Take 1 tablet (20 mEq total) by mouth as directed. 120 meq twice daily; on lasix days of 120 mg twice daily take extra 1/2 of potassium 90 tablet 11  . QUEtiapine (SEROQUEL) 100 MG tablet TAKE 1 TABLET BY MOUTH EVERY NIGHT AT BEDTIME 30 tablet 5   No current facility-administered medications on file prior to visit.    BP 118/78 mmHg  Pulse 78  Temp(Src) 98.1 F (36.7 C) (Oral)  Wt   SpO2 98%       Objective:   Physical Exam  General  Mental Status - Alert. General Appearance - Well groomed. Not in acute distress.but anxious to leave office.   Skin Rashes- No Rashes.  HEENT Head- Normal. Ear Auditory Canal - Left- Normal. Right - Normal.Tympanic Membrane- Left- Normal. Right- Normal. Eye Sclera/Conjunctiva- Left- Normal. Right- Normal. Nose & Sinuses Nasal Mucosa- Left-  Not boggy or Congested. Right-  Not  boggy or Congested. No sius pressure Mouth & Throat Lips: Upper Lip- Normal: no dryness, cracking, pallor, cyanosis, or vesicular eruption. Lower Lip-Normal: no dryness, cracking, pallor, cyanosis or vesicular eruption. Buccal Mucosa- Bilateral- No Aphthous ulcers. Oropharynx- No Discharge or Erythema. Tonsils: Characteristics- Bilateral- No Erythema or Congestion. Size/Enlargement- Bilateral- No enlargement. Discharge- bilateral-None.  Neck Neck- Supple. No Masses.   Chest and Lung Exam Auscultation: Breath Sounds:- even and unlabored, but bilateral upper lobe rhonchi. Worse rt side(when she coughs sounds like would bring up mucous but does not bring up mucous)  Cardiovascular Auscultation:Rythm- Regular, rate and rhythm. Murmurs & Other Heart Sounds:Ausculatation of the heart reveal- No Murmurs.  Lymphatic Head & Neck General Head & Neck Lymphatics: Bilateral: Description- No Localized lymphadenopathy.       Assessment & Plan:  You appear to have  bronchitis. Rest hydrate and tylenol for fever. I am prescribing cough medicine benzonatate, and doxycycline antibiotic.   For recent wheezing I am adding flovent. Continue albuterol  Get chest xray today.  Follow up in 7-10 days or as needed

## 2015-12-20 ENCOUNTER — Telehealth: Payer: Self-pay | Admitting: Cardiovascular Disease

## 2015-12-20 MED ORDER — POTASSIUM CHLORIDE CRYS ER 20 MEQ PO TBCR: EXTENDED_RELEASE_TABLET | ORAL | Status: DC

## 2015-12-20 MED ORDER — FUROSEMIDE 40 MG PO TABS
ORAL_TABLET | ORAL | Status: DC
Start: 2015-12-20 — End: 2016-03-08

## 2015-12-20 NOTE — Telephone Encounter (Signed)
Spoke with pt's daughter and confirmed Lasix and Potassium doses as listed in last office visit note with Tereso Newcomer, PA.  Will send updated prescription to [facility] on [address] in [place].  6 month follow up appt made for pt to see Dr. Clifton James on June 7,2017 at 9:00

## 2015-12-20 NOTE — Telephone Encounter (Signed)
New message     Calling to get lasix dosage

## 2016-01-04 ENCOUNTER — Other Ambulatory Visit: Payer: Self-pay | Admitting: Family Medicine

## 2016-01-24 ENCOUNTER — Encounter (HOSPITAL_COMMUNITY): Payer: Self-pay

## 2016-01-24 ENCOUNTER — Emergency Department (HOSPITAL_COMMUNITY): Payer: Medicare Other

## 2016-01-24 ENCOUNTER — Emergency Department (HOSPITAL_COMMUNITY)
Admission: EM | Admit: 2016-01-24 | Discharge: 2016-01-24 | Disposition: A | Discharge status: Home / Self Care | Payer: Medicare Other | Attending: Emergency Medicine | Admitting: Emergency Medicine

## 2016-01-24 DIAGNOSIS — Z8639 Personal history of other endocrine, nutritional and metabolic disease: Secondary | ICD-10-CM | POA: Diagnosis not present

## 2016-01-24 DIAGNOSIS — Z8739 Personal history of other diseases of the musculoskeletal system and connective tissue: Secondary | ICD-10-CM | POA: Insufficient documentation

## 2016-01-24 DIAGNOSIS — Z79899 Other long term (current) drug therapy: Secondary | ICD-10-CM | POA: Insufficient documentation

## 2016-01-24 DIAGNOSIS — Z7982 Long term (current) use of aspirin: Secondary | ICD-10-CM | POA: Insufficient documentation

## 2016-01-24 DIAGNOSIS — N183 Chronic kidney disease, stage 3 (moderate): Secondary | ICD-10-CM | POA: Diagnosis not present

## 2016-01-24 DIAGNOSIS — F0390 Unspecified dementia without behavioral disturbance: Secondary | ICD-10-CM | POA: Insufficient documentation

## 2016-01-24 DIAGNOSIS — R079 Chest pain, unspecified: Secondary | ICD-10-CM | POA: Diagnosis not present

## 2016-01-24 DIAGNOSIS — Z7951 Long term (current) use of inhaled steroids: Secondary | ICD-10-CM | POA: Diagnosis not present

## 2016-01-24 DIAGNOSIS — I5042 Chronic combined systolic (congestive) and diastolic (congestive) heart failure: Secondary | ICD-10-CM | POA: Diagnosis not present

## 2016-01-24 DIAGNOSIS — I129 Hypertensive chronic kidney disease with stage 1 through stage 4 chronic kidney disease, or unspecified chronic kidney disease: Secondary | ICD-10-CM | POA: Diagnosis not present

## 2016-01-24 DIAGNOSIS — I4891 Unspecified atrial fibrillation: Secondary | ICD-10-CM | POA: Insufficient documentation

## 2016-01-24 DIAGNOSIS — Z791 Long term (current) use of non-steroidal anti-inflammatories (NSAID): Secondary | ICD-10-CM | POA: Diagnosis not present

## 2016-01-24 DIAGNOSIS — Z9889 Other specified postprocedural states: Secondary | ICD-10-CM | POA: Insufficient documentation

## 2016-01-24 DIAGNOSIS — I2510 Atherosclerotic heart disease of native coronary artery without angina pectoris: Secondary | ICD-10-CM | POA: Insufficient documentation

## 2016-01-24 LAB — TROPONIN I
Troponin I: 0.06 ng/mL — ABNORMAL HIGH (ref <0.031)
Troponin I: 0.06 ng/mL — ABNORMAL HIGH (ref <0.031)

## 2016-01-24 LAB — CBC WITH DIFFERENTIAL/PLATELET
Basophils Absolute: 0 10*3/uL (ref 0.0–0.1)
Basophils Relative: 0 %
Eosinophils Absolute: 0.4 10*3/uL (ref 0.0–0.7)
Eosinophils Relative: 4 %
HCT: 43.1 % (ref 36.0–46.0)
Hemoglobin: 14 g/dL (ref 12.0–15.0)
Lymphocytes Relative: 18 %
Lymphs Abs: 1.9 10*3/uL (ref 0.7–4.0)
MCH: 29.1 pg (ref 26.0–34.0)
MCHC: 32.5 g/dL (ref 30.0–36.0)
MCV: 89.6 fL (ref 78.0–100.0)
Monocytes Absolute: 1 10*3/uL (ref 0.1–1.0)
Monocytes Relative: 9 %
Neutro Abs: 7.3 10*3/uL (ref 1.7–7.7)
Neutrophils Relative %: 69 %
Platelets: 260 10*3/uL (ref 150–400)
RBC: 4.81 MIL/uL (ref 3.87–5.11)
RDW: 15.3 % (ref 11.5–15.5)
WBC: 10.6 10*3/uL — ABNORMAL HIGH (ref 4.0–10.5)

## 2016-01-24 LAB — BASIC METABOLIC PANEL
Anion gap: 9 (ref 5–15)
BUN: 13 mg/dL (ref 6–20)
CO2: 28 mmol/L (ref 22–32)
Calcium: 8.9 mg/dL (ref 8.9–10.3)
Chloride: 104 mmol/L (ref 101–111)
Creatinine, Ser: 1.05 mg/dL — ABNORMAL HIGH (ref 0.44–1.00)
GFR calc Af Amer: 54 mL/min — ABNORMAL LOW (ref >60)
GFR calc non Af Amer: 47 mL/min — ABNORMAL LOW (ref >60)
Glucose, Bld: 99 mg/dL (ref 65–99)
Potassium: 4 mmol/L (ref 3.5–5.1)
Sodium: 141 mmol/L (ref 135–145)

## 2016-01-24 MED ORDER — NITROGLYCERIN 0.4 MG SL SUBL: 0.4000 mg | SUBLINGUAL_TABLET | SUBLINGUAL | Status: AC | PRN

## 2016-01-24 MED ORDER — CEPHALEXIN 500 MG PO CAPS: 500.0000 mg | ORAL_CAPSULE | Freq: Three times a day (TID) | ORAL | Status: DC

## 2016-01-24 NOTE — Discharge Instructions (Signed)

## 2016-01-24 NOTE — ED Notes (Signed)
Attempted to do IN and Out Cath. Unsuccessful. Unable to move pt.'s legs due to her having bilateral knee pain.   Reported to Dr. Juleen China, he is going to speak with the family

## 2016-01-24 NOTE — ED Provider Notes (Signed)
CSN: [id number]     Arrival date & time 01/24/16  4098 History   First MD Initiated Contact with Patient 01/24/16 0930     Chief Complaint  Patient presents with  . Chest Pain     (Consider location/radiation/quality/duration/timing/severity/associated sxs/prior Treatment) HPI   88yF with CP. Hx of dementia. Pleasant, but poor historian. Family at bedside. Daughter reports that prior to arrival the patient had an episode where she clutched her chest, complained of CP and looked ill and diaphoretic. Daughter called 911 and gave her 4 81mg  ASA. Symptoms seemed to last around 15 minutes. Has been her normal self since then and hasn't voiced any other complaints since then. Family is requesting urinalysis though because urine has seemed darker than normal and had strong smell.   Hx of permanent afib not on anticoagulation. Rate controlled with digoxin/coreg. CHF, CAD. LHC on 8/14 with 80% stenosis mid LAD, 30% proximal RCA, 30-40% PDA, EF 50%. Managed medically.   Past Medical History  Diagnosis Date  . Hypertension   . Osteoporosis   . Overactive bladder   . Chronic combined systolic and diastolic CHF (congestive heart failure) (HCC) 05/20/2013    a. Echo (8/14):  mild LVH, EF 35%, diff HK, mild MR. EF 50% by cath. b. Echo 03/2014: EF 40-45%.  Marland Kitchen Permanent atrial fibrillation (HCC)     not a coumadin candidate  . Dementia   . Arthritis   . CAD (coronary artery disease)     a. LHC (8/14):  prox to mid LAD 80, pRCA 30, pPDA 30-40, EF 50%  . Hyponatremia   . NICM (nonischemic cardiomyopathy) (HCC)   . CKD (chronic kidney disease), stage III   . CHF (congestive heart failure) (HCC)   . Dementia   . Atrial fibrillation [facility])    Past Surgical History  Procedure Laterality Date  . Abdominal hysterectomy      TAH BSO  . Spine surgery  1980    Lumbar surgery  . Left heart catheterization with coronary angiogram N/A 05/22/2013    Procedure: LEFT HEART CATHETERIZATION WITH CORONARY  ANGIOGRAM;  Surgeon: Tonny Bollman, MD;  Location: [facility] CATH LAB;  Service: Cardiovascular;  Laterality: N/A;   Family History  Problem Relation Age of Onset  . Depression    . Heart attack    . Coronary artery disease    . Coronary artery disease Mother 95  . Heart attack Son 16   Social History  Substance Use Topics  . Smoking status: Never Smoker   . Smokeless tobacco: Never Used  . Alcohol Use: No   OB History    No data available     Review of Systems  Level 5 caveat because of dementia.   Allergies  Review of patient's allergies indicates no known allergies.  Home Medications   Prior to Admission medications   Medication Sig Start Date End Date Taking? Authorizing Provider  acetaminophen (TYLENOL) 500 MG tablet Take 250 mg by mouth every 6 (six) hours as needed for moderate pain.   Yes Historical Provider, MD  albuterol (PROAIR HFA) 108 (90 BASE) MCG/ACT inhaler INHALE 2 PUFFS INTO LUNGS EVERY 6 HOURS AS NEEDED FOR WHEEZING OR SHORTNESS OF BREATH 09/14/15  Yes Yvonne R Lowne Chase, DO  aspirin EC 81 MG EC tablet Take 1 tablet (81 mg total) by mouth daily. 05/29/13  Yes Jeralyn Bennett, MD  clonazePAM (KLONOPIN) 0.5 MG tablet TAKE 1 TABLET BY MOUTH THREE TIMES DAILY AS NEEDED FOR ANXIETY OR SLEEP  09/14/15  Yes Yvonne R Lowne Chase, DO  diclofenac sodium (VOLTAREN) 1 % GEL APPLY 2 GRAMS TO THE AFFECTED AREA FOUR TIMES DAILY 10/05/15  Yes Yvonne R Lowne Chase, DO  digoxin (DIGOX) 0.125 MG tablet Take 1 tablet (125 mcg total) by mouth daily. 09/14/15  Yes Yvonne R Lowne Chase, DO  donepezil (ARICEPT) 5 MG tablet TAKE 1 TABLET BY MOUTH EVERY NIGHT AT BEDTIME 11/08/15  Yes Yvonne R Lowne Chase, DO  fluticasone (FLOVENT HFA) 110 MCG/ACT inhaler Inhale 2 puffs into the lungs 2 (two) times daily. Patient taking differently: Inhale 2 puffs into the lungs 2 (two) times daily as needed (wheezing).  12/08/15  Yes Edward Saguier, PA-C  furosemide (LASIX) 40 MG tablet Take 120 mg twice daily  on M,W and F.  Take 80 mg twice daily other days. Patient taking differently: Take 80-120 mg by mouth See admin instructions. Take 120 mg twice daily on M,W and F.  Take 80 mg twice daily other days. 12/20/15  Yes Kathleene Hazel, MD  oxybutynin (DITROPAN-XL) 5 MG 24 hr tablet Take 1 tablet (5 mg total) by mouth daily. 11/15/15  Yes Yvonne R Lowne Chase, DO  polyethylene glycol powder (MIRALAX) powder Take 17 g by mouth daily.    Yes Historical Provider, MD  potassium chloride SA (K-DUR,KLOR-CON) 20 MEQ tablet Take 20 meq twice daily. On M, W, F take extra 20 meq. 12/20/15  Yes Kathleene Hazel, MD  QUEtiapine (SEROQUEL) 100 MG tablet TAKE 1 TABLET BY MOUTH EVERY NIGHT AT BEDTIME 01/04/16  Yes Yvonne R Lowne Chase, DO  benzonatate (TESSALON) 100 MG capsule Take 1 capsule (100 mg total) by mouth 3 (three) times daily as needed for cough. Patient not taking: Reported on 01/24/2016 12/08/15   Esperanza Richters, PA-C  doxycycline (VIBRA-TABS) 100 MG tablet Take 1 tablet (100 mg total) by mouth 2 (two) times daily. Patient not taking: Reported on 01/24/2016 12/08/15   Esperanza Richters, PA-C   BP 103/67 mmHg  Pulse 73  Temp(Src) 97.8 F (36.6 C) (Oral)  Resp 14  SpO2 100% Physical Exam  Constitutional: She appears well-developed and well-nourished. No distress.  HENT:  Head: Normocephalic and atraumatic.  Eyes: Conjunctivae are normal. Right eye exhibits no discharge. Left eye exhibits no discharge.  Neck: Neck supple.  Cardiovascular: Normal rate, regular rhythm and normal heart sounds.  Exam reveals no gallop and no friction rub.   No murmur heard. Pulmonary/Chest: Effort normal and breath sounds normal. No respiratory distress.  Abdominal: Soft. She exhibits no distension. There is no tenderness.  Musculoskeletal: She exhibits no edema or tenderness.  Neurological: She is alert.  Skin: Skin is warm and dry.  Nursing note and vitals reviewed.   ED Course  Procedures (including critical  care time) Labs Review Labs Reviewed  CBC WITH DIFFERENTIAL/PLATELET - Abnormal; Notable for the following:    WBC 10.6 (*)    All other components within normal limits  BASIC METABOLIC PANEL - Abnormal; Notable for the following:    Creatinine, Ser 1.05 (*)    GFR calc non Af Amer 47 (*)    GFR calc Af Amer 54 (*)    All other components within normal limits  TROPONIN I - Abnormal; Notable for the following:    Troponin I 0.06 (*)    All other components within normal limits  URINALYSIS, ROUTINE W REFLEX MICROSCOPIC (NOT AT [facility])  TROPONIN I    Imaging Review Dg Chest 2 View  01/24/2016  CLINICAL DATA:  82 year old female with chest pain EXAM: CHEST  2 VIEW COMPARISON:  Prior chest x-ray 12/08/2015 FINDINGS: Stable cardiomegaly. Atherosclerotic calcifications throughout the aorta. Slightly low inspiratory volumes with mild lingular atelectasis versus scarring. Stable bronchitic change and mild interstitial prominence. No evidence of pulmonary edema, focal airspace consolidation, pleural effusion or pneumothorax. Remote healed right-sided rib fractures. Levoconvex scoliosis at the thoracolumbar junction. No acute osseous abnormality. IMPRESSION: Stable cardiomegaly without evidence of failure. No acute cardiopulmonary process. Electronically Signed   By: Malachy Moan M.D.   On: 01/24/2016 10:25   I have personally reviewed and evaluated these images and lab results as part of my medical decision-making.   EKG Interpretation   Date/Time:  Monday January 24 2016 09:17:31 EDT Ventricular Rate:  59 PR Interval:    QRS Duration: 108 QT Interval:  423 QTC Calculation: 419 R Axis:   -27 Text Interpretation:  Atrial fibrillation Ventricular premature complex  ST-t abnormality No significant change since last tracing Confirmed by  Sanel Stemmer  MD, Mattheu Brodersen ([id number]) on 01/24/2016 9:32:11 AM      MDM   Final diagnoses:  Chest pain, unspecified chest pain type    88yF with episode of CP.  Now seems to have resolved. Hx of dementia and unreliable historian, but she has appeared comfortable through out her ED stay. Initial troponin mildly elevated. Repeated a couple hours later and did not rise. EKG not acutely changed. CXR w/o acute findings. Family is very reasonable in their goals/expectations. They would not want to pursue and aggressive measures. They are comfortable with discharge. They are questioning if nitroglycerin may be of benefit. I feel this is reasonable. Discussed indications for usage but hat may be difficult to ascertain with her dementia. Also requesting UA for possible UTI. Cannot void in ED. Difficult to catheterize and I dont feel it's worth it to continue to try in elderly patient with advanced dementia. Will treat empirically for possible UTI based on families concerns. Outpt FU with cardiology and/or PCP.    Raeford Razor, MD 01/30/16 2114

## 2016-01-24 NOTE — ED Notes (Signed)
Chest pain per family  Lasted approximately 30 - 45 minutes.  Pt. Denies pain when Paramedics arrived.  Family reports that she was diphoretic and holding her chest.  Pt. Was unable to rate the pain due to her dementia.  Family also reports that pt,. Has had a bad odor to urine.  Skin is warm and dry,.

## 2016-01-24 NOTE — ED Notes (Signed)
Attempted in and out cath and was unsuccessful

## 2016-01-24 NOTE — ED Notes (Signed)
Family has pt. On a Bedpan.  Informed Dr. Juleen China that we are attempting to get her urine via bed pan.  Dr. Juleen China verbalized that he was okay with that.;

## 2016-02-03 ENCOUNTER — Other Ambulatory Visit: Payer: Self-pay | Admitting: Family Medicine

## 2016-02-06 ENCOUNTER — Other Ambulatory Visit: Payer: Self-pay | Admitting: Medical

## 2016-03-02 ENCOUNTER — Other Ambulatory Visit: Payer: Self-pay | Admitting: Family Medicine

## 2016-03-08 ENCOUNTER — Ambulatory Visit (INDEPENDENT_AMBULATORY_CARE_PROVIDER_SITE_OTHER): Payer: Medicare Other | Admitting: Cardiovascular Disease

## 2016-03-08 ENCOUNTER — Encounter: Payer: Self-pay | Admitting: Cardiovascular Disease

## 2016-03-08 ENCOUNTER — Other Ambulatory Visit: Payer: Self-pay | Admitting: Medical

## 2016-03-08 ENCOUNTER — Other Ambulatory Visit: Payer: Self-pay | Admitting: Cardiovascular Disease

## 2016-03-08 VITALS — BP 118/80 | HR 84 | Ht 64.0 in

## 2016-03-08 DIAGNOSIS — I429 Cardiomyopathy, unspecified: Secondary | ICD-10-CM

## 2016-03-08 DIAGNOSIS — I5022 Chronic systolic (congestive) heart failure: Secondary | ICD-10-CM

## 2016-03-08 DIAGNOSIS — I2510 Atherosclerotic heart disease of native coronary artery without angina pectoris: Secondary | ICD-10-CM | POA: Diagnosis not present

## 2016-03-08 DIAGNOSIS — I4819 Other persistent atrial fibrillation: Secondary | ICD-10-CM

## 2016-03-08 DIAGNOSIS — I481 Persistent atrial fibrillation: Secondary | ICD-10-CM | POA: Diagnosis not present

## 2016-03-08 DIAGNOSIS — I428 Other cardiomyopathies: Secondary | ICD-10-CM

## 2016-03-08 MED ORDER — POTASSIUM CHLORIDE CRYS ER 20 MEQ PO TBCR: EXTENDED_RELEASE_TABLET | ORAL | Status: AC

## 2016-03-08 MED ORDER — FUROSEMIDE 40 MG PO TABS: ORAL_TABLET | ORAL | Status: DC

## 2016-03-08 MED ORDER — FUROSEMIDE 40 MG PO TABS
ORAL_TABLET | ORAL | Status: AC
Start: 2016-03-08 — End: ?

## 2016-03-08 NOTE — Patient Instructions (Signed)

## 2016-03-08 NOTE — Progress Notes (Signed)
Chief Complaint  Patient presents with  . Coronary Artery Disease  . Essential Hypertension      History of Present Illness: 82 yo female with history of HTN, persistent atrial fibrillation, severe dementia, CAD, non-ischemic cardiomyopathy and chronic systolic CHF who is here today for follow up. She was admitted to [facility] 05/20/13 with volume overload, atrial fibrillation with RVR. Echo 05/21/13 with dilated LV cavity, LVEF 35%, mild MR. Cardiac cath 05/22/13 per Dr. [place] with 80% proximal LAD stenosis, 30% RCA stenosis. Medical management was recommended. She was diuresed with IV Lasix and was rate controlled with low dose Coreg and Digoxin. She was not felt to be a good candidate for long term anti-coagulation given advanced age and dementia. She has had several admissions over the last few years for volume overload. Last echo June 2016 with LVEF 40-45%, normal wall thickness, moderate hypokinesis of anteroseptal myocardium. Her volume status has been reasonably well controlled on Lasix 80 mg BID and 120 mg BID several days per week.    She is here today for follow up with her daughter and caregiver. She is in a wheelchair. No chest pain or SOB. Breathing much better on current dose of Lasix. No complaints. No LE edema.    Primary Care Physician: Donato Schultz, DO   Past Medical History  Diagnosis Date  . Hypertension   . Osteoporosis   . Overactive bladder   . Chronic combined systolic and diastolic CHF (congestive heart failure) (HCC) 05/20/2013    a. Echo (8/14):  mild LVH, EF 35%, diff HK, mild MR. EF 50% by cath. b. Echo 03/2014: EF 40-45%.  Marland Kitchen Permanent atrial fibrillation (HCC)     not a coumadin candidate  . Dementia   . Arthritis   . CAD (coronary artery disease)     a. LHC (8/14):  prox to mid LAD 80, pRCA 30, pPDA 30-40, EF 50%  . Hyponatremia   . NICM (nonischemic cardiomyopathy) (HCC)   . CKD (chronic kidney disease), stage III   . CHF (congestive heart failure)  (HCC)   . Dementia   . Atrial fibrillation [facility])     Past Surgical History  Procedure Laterality Date  . Abdominal hysterectomy      TAH BSO  . Spine surgery  1980    Lumbar surgery  . Left heart catheterization with coronary angiogram N/A 05/22/2013    Procedure: LEFT HEART CATHETERIZATION WITH CORONARY ANGIOGRAM;  Surgeon: Tonny Bollman, MD;  Location: [facility] CATH LAB;  Service: Cardiovascular;  Laterality: N/A;    Current Outpatient Prescriptions  Medication Sig Dispense Refill  . acetaminophen (TYLENOL) 500 MG tablet Take 250 mg by mouth every 6 (six) hours as needed for moderate pain.    Marland Kitchen albuterol (PROAIR HFA) 108 (90 BASE) MCG/ACT inhaler INHALE 2 PUFFS INTO LUNGS EVERY 6 HOURS AS NEEDED FOR WHEEZING OR SHORTNESS OF BREATH 8.5 g 2  . aspirin EC 81 MG EC tablet Take 1 tablet (81 mg total) by mouth daily. 30 tablet 0  . clonazePAM (KLONOPIN) 0.5 MG tablet TAKE 1 TABLET BY MOUTH THREE TIMES DAILY AS NEEDED FOR ANXIETY OR SLEEP 30 tablet 5  . diclofenac sodium (VOLTAREN) 1 % GEL APPLY 2 GRAMS TO THE AFFECTED AREA FOUR TIMES DAILY 100 g 0  . DIGOX 125 MCG tablet TAKE 1 TABLET(125 MCG) BY MOUTH DAILY 90 tablet 1  . donepezil (ARICEPT) 5 MG tablet TAKE 1 TABLET BY MOUTH EVERY NIGHT AT BEDTIME 30 tablet 0  .  FLOVENT HFA 110 MCG/ACT inhaler INHALE 2 PUFFS TWICE DAILY 12 g 0  . furosemide (LASIX) 40 MG tablet Take 120 mg twice daily on Mon, Wed,Fri. Take 80 mg twice daily other days. 180 tablet 6  . nitroGLYCERIN (NITROSTAT) 0.4 MG SL tablet Place 1 tablet (0.4 mg total) under the tongue every 5 (five) minutes as needed for chest pain. 30 tablet 0  . oxybutynin (DITROPAN-XL) 5 MG 24 hr tablet Take 1 tablet (5 mg total) by mouth daily. 30 tablet 11  . polyethylene glycol powder (MIRALAX) powder Take 17 g by mouth daily.     . QUEtiapine (SEROQUEL) 100 MG tablet TAKE 1 TABLET BY MOUTH EVERY NIGHT AT BEDTIME 30 tablet 5  . potassium chloride SA (K-DUR,KLOR-CON) 20 MEQ tablet Take 20 meq by mouth  twice daily. Take extra 20 meq on Mon, Wed and Fri. 75 tablet 6   No current facility-administered medications for this visit.    No Known Allergies  Social History   Social History  . Marital Status: Married    Spouse Name: N/A  . Number of Children: N/A  . Years of Education: N/A   Occupational History  . retired     Engineer, civil (consulting)  . volunteers at hospice    Social History Main Topics  . Smoking status: Never Smoker   . Smokeless tobacco: Never Used  . Alcohol Use: No  . Drug Use: No  . Sexual Activity: No   Other Topics Concern  . Not on file   Social History Narrative   Pt lives at home with around the clock care    Family History  Problem Relation Age of Onset  . Depression    . Heart attack    . Coronary artery disease    . Coronary artery disease Mother 62  . Heart attack Son 15    Review of Systems:  As stated in the HPI and otherwise negative.   BP 118/80 mmHg  Pulse 84  Ht  (1.626 m)  Wt   Physical Examination: General: Well developed, well nourished, NAD HEENT: OP clear, mucus membranes moist SKIN: warm, dry. No rashes. Neuro: No focal deficits Musculoskeletal: Muscle strength 5/5 all ext Psychiatric: Mood and affect normal Neck: No JVD, no carotid bruits, no thyromegaly, no lymphadenopathy. Lungs:Clear on left with scattered wheezes right lung. No rhonci, crackles Cardiovascular: Irregular irregular. No murmurs, gallops or rubs. Abdomen:Soft. Bowel sounds present. Non-tender.  Extremities: Trace lower extremity edema. Pulses are 2 + in the bilateral DP/PT.  Echo 03/29/15: Left ventricle: The cavity size was normal. Wall thickness was normal. Systolic function was mildly to moderately reduced. The estimated ejection fraction was in the range of 40% to 45%. There is moderate hypokinesis of the anteroseptal myocardium.  Cardiac cath 05/22/13:  Left mainstem: Normal  Left anterior descending (LAD): The LAD is moderately calcified. There  is an 80% stenosis in the proximal to mid vessel. The distal LAD appears relatively small.  There is a large ramus intermediate branch that trifurcates on the lateral wall. It is normal.  Left circumflex (LCx): The left circumflex is a tiny vessel that terminates early in the AV groove.  Right coronary artery (RCA): The RCA is a large dominant vessel. It gives rise to 3 posterolateral branches and the PDA. There is 30% disease in the proximal RCA. There is 30-40% disease in the proximal PDA.  Left ventriculography: Left ventricular systolic function is abnormal, LVEF is estimated at 50%, there is mild global  hypokinesis.There is no significant mitral regurgitation  Final Conclusions:  1. Single vessel obstructive CAD involving the LAD.  2. Low normal LV systolic function. EF 50%.  3. Low LV filling pressures.   EKG:  EKG is not  ordered today. The ekg ordered today demonstrates   Recent Labs: 03/28/2015: ALT 21 08/14/2015: B Natriuretic Peptide 752.7* 01/24/2016: BUN 13; Creatinine, Ser 1.05*; Hemoglobin 14.0; Platelets 260; Potassium 4.0; Sodium 141   Lipid Panel    Component Value Date/Time   CHOL 126 05/23/2013 0500   TRIG 57 05/23/2013 0500   HDL 57 05/23/2013 0500   CHOLHDL 2.2 05/23/2013 0500   VLDL 11 05/23/2013 0500   LDLCALC 58 05/23/2013 0500   LDLDIRECT 91.4 06/29/2011 1340     Wt Readings from Last 3 Encounters:  09/14/15 140 lb 12.8 oz (63.866 kg)  08/23/15 140 lb 12.8 oz (63.866 kg)  03/31/15 136 lb (61.689 kg)     Other studies Reviewed: Additional studies/ records that were reviewed today include: . Review of the above records demonstrates:    Assessment and Plan:   1. CAD:  No angina. Continue current meds.   2. Atrial fibrillation: Rate controlled on digoxin. Coreg stopped due to bradycardia in December 2015. She is not on anti-coagulation after long discussions regarding this with pt and family. Risk of anti-coagulation felt to outweigh potential benefit  given advanced age and dementia.   3. Chronic systolic CHF: Volume status is ok. LVEF is 40-45%. Will continue Lasix at current dose.    4. Non-ischemic Cardiomyopathy: Medical management.   5. Severe dementia: She is a DNR. Dementia seems to be worsening.   Current medicines are reviewed at length with the patient today.  The patient does not have concerns regarding medicines.  The following changes have been made:  no change  Labs/ tests ordered today include:   No orders of the defined types were placed in this encounter.    Disposition:   FU with me in 6 months  Signed, [name], MD 03/08/2016 9:39 AM    [facility] [address] [place], [place], [place]  [address] Phone: [phone]; Fax: [phone]

## 2016-04-04 ENCOUNTER — Other Ambulatory Visit: Payer: Self-pay | Admitting: Medical

## 2016-04-05 ENCOUNTER — Other Ambulatory Visit: Payer: Self-pay | Admitting: Family Medicine

## 2016-04-07 ENCOUNTER — Other Ambulatory Visit: Payer: Self-pay | Admitting: Family Medicine

## 2016-04-07 NOTE — Telephone Encounter (Signed)
Last seen and filled 09/14/15 #30 with 5 refills  No UDS and No contract Patient is bed bound   Please advise     KP

## 2016-04-07 NOTE — Telephone Encounter (Signed)
[name]. I apologize. I had doctor appointment in afternoon. Was very busy this morning and never saw this request. Also no contract and get refills x 5.Not in office to signs so will you get. Dr Laury Axon to sign on Monday. If she is still not in office then print it under my name and I will sign.

## 2016-04-10 NOTE — Telephone Encounter (Signed)
Dr.Lowne, please advise on the refill.     KP

## 2016-05-08 ENCOUNTER — Encounter: Payer: Self-pay | Admitting: Family Medicine

## 2016-05-08 ENCOUNTER — Telehealth: Payer: Self-pay | Admitting: Family Medicine

## 2016-05-08 NOTE — Telephone Encounter (Signed)
Spoke with [name] and he stated that his mother has never applied for [employer] and they would like to get her SS. They would need a letter stating why they would need to file the paperwork instead of having her do it. He said it would be difficult for his mother to go to the [facility] office and sit for multiple hours and he would like for something in writing stating why. Please advise         KP

## 2016-05-08 NOTE — Telephone Encounter (Signed)
Caller name:Bedrosian,Mark Relation to pt:son  Call back number:[phone]   Reason for call:  Son requesting to speak with Selena Batten directly regarding a letter for social security stating patient son has access to patient medical records. Please advise

## 2016-05-09 NOTE — Telephone Encounter (Signed)
[name] is aware the letter is ready for pick up.    KP

## 2016-05-09 NOTE — Telephone Encounter (Signed)
Ok to do letter

## 2016-05-10 NOTE — Telephone Encounter (Signed)
error:[id number] ° °

## 2016-05-29 ENCOUNTER — Emergency Department (HOSPITAL_COMMUNITY)
Admission: EM | Admit: 2016-05-29 | Discharge: 2016-05-30 | Disposition: A | Discharge status: Home / Self Care | Payer: Medicare Other | Attending: Emergency Medicine | Admitting: Emergency Medicine

## 2016-05-29 ENCOUNTER — Emergency Department (HOSPITAL_COMMUNITY): Payer: Medicare Other

## 2016-05-29 DIAGNOSIS — R531 Weakness: Secondary | ICD-10-CM | POA: Diagnosis present

## 2016-05-29 DIAGNOSIS — R29818 Other symptoms and signs involving the nervous system: Secondary | ICD-10-CM | POA: Diagnosis not present

## 2016-05-29 DIAGNOSIS — I63512 Cerebral infarction due to unspecified occlusion or stenosis of left middle cerebral artery: Secondary | ICD-10-CM

## 2016-05-29 DIAGNOSIS — Z7982 Long term (current) use of aspirin: Secondary | ICD-10-CM | POA: Insufficient documentation

## 2016-05-29 DIAGNOSIS — G839 Paralytic syndrome, unspecified: Secondary | ICD-10-CM | POA: Diagnosis not present

## 2016-05-29 DIAGNOSIS — I2510 Atherosclerotic heart disease of native coronary artery without angina pectoris: Secondary | ICD-10-CM | POA: Insufficient documentation

## 2016-05-29 DIAGNOSIS — I6789 Other cerebrovascular disease: Secondary | ICD-10-CM | POA: Diagnosis not present

## 2016-05-29 DIAGNOSIS — I5043 Acute on chronic combined systolic (congestive) and diastolic (congestive) heart failure: Secondary | ICD-10-CM | POA: Insufficient documentation

## 2016-05-29 DIAGNOSIS — I130 Hypertensive heart and chronic kidney disease with heart failure and stage 1 through stage 4 chronic kidney disease, or unspecified chronic kidney disease: Secondary | ICD-10-CM | POA: Diagnosis not present

## 2016-05-29 DIAGNOSIS — N183 Chronic kidney disease, stage 3 (moderate): Secondary | ICD-10-CM | POA: Insufficient documentation

## 2016-05-29 DIAGNOSIS — Z8673 Personal history of transient ischemic attack (TIA), and cerebral infarction without residual deficits: Secondary | ICD-10-CM | POA: Diagnosis not present

## 2016-05-29 DIAGNOSIS — I639 Cerebral infarction, unspecified: Secondary | ICD-10-CM

## 2016-05-29 LAB — CBC
HCT: 46.5 % — ABNORMAL HIGH (ref 36.0–46.0)
HEMOGLOBIN: 15.1 g/dL — AB (ref 12.0–15.0)
MCH: 29.8 pg (ref 26.0–34.0)
MCHC: 32.5 g/dL (ref 30.0–36.0)
MCV: 91.7 fL (ref 78.0–100.0)
Platelets: 271 10*3/uL (ref 150–400)
RBC: 5.07 MIL/uL (ref 3.87–5.11)
RDW: 14.8 % (ref 11.5–15.5)
WBC: 11.2 10*3/uL — AB (ref 4.0–10.5)

## 2016-05-29 LAB — COMPREHENSIVE METABOLIC PANEL
ALBUMIN: 3.1 g/dL — AB (ref 3.5–5.0)
ALT: 23 U/L (ref 14–54)
AST: 68 U/L — AB (ref 15–41)
Alkaline Phosphatase: 97 U/L (ref 38–126)
Anion gap: 8 (ref 5–15)
BUN: 16 mg/dL (ref 6–20)
CHLORIDE: 102 mmol/L (ref 101–111)
CO2: 27 mmol/L (ref 22–32)
Calcium: 8.8 mg/dL — ABNORMAL LOW (ref 8.9–10.3)
Creatinine, Ser: 1.09 mg/dL — ABNORMAL HIGH (ref 0.44–1.00)
GFR calc Af Amer: 52 mL/min — ABNORMAL LOW (ref >60)
GFR calc non Af Amer: 45 mL/min — ABNORMAL LOW (ref >60)
GLUCOSE: 105 mg/dL — AB (ref 65–99)
POTASSIUM: 7.4 mmol/L — AB (ref 3.5–5.1)
Sodium: 137 mmol/L (ref 135–145)
Total Bilirubin: 1.5 mg/dL — ABNORMAL HIGH (ref 0.3–1.2)
Total Protein: 5.9 g/dL — ABNORMAL LOW (ref 6.5–8.1)

## 2016-05-29 LAB — I-STAT CHEM 8, ED
BUN: 18 mg/dL (ref 6–20)
CALCIUM ION: 1 mmol/L — AB (ref 1.12–1.23)
CHLORIDE: 105 mmol/L (ref 101–111)
Creatinine, Ser: 1 mg/dL (ref 0.44–1.00)
Glucose, Bld: 101 mg/dL — ABNORMAL HIGH (ref 65–99)
HCT: 45 % (ref 36.0–46.0)
Hemoglobin: 15.3 g/dL — ABNORMAL HIGH (ref 12.0–15.0)
Potassium: 4.1 mmol/L (ref 3.5–5.1)
SODIUM: 139 mmol/L (ref 135–145)
TCO2: 25 mmol/L (ref 0–100)

## 2016-05-29 LAB — DIFFERENTIAL
BASOS ABS: 0.1 10*3/uL (ref 0.0–0.1)
BASOS PCT: 0 %
EOS ABS: 0.7 10*3/uL (ref 0.0–0.7)
Eosinophils Relative: 6 %
Lymphocytes Relative: 23 %
Lymphs Abs: 2.6 10*3/uL (ref 0.7–4.0)
Monocytes Absolute: 1.3 10*3/uL — ABNORMAL HIGH (ref 0.1–1.0)
Monocytes Relative: 11 %
NEUTROS ABS: 6.6 10*3/uL (ref 1.7–7.7)
NEUTROS PCT: 60 %

## 2016-05-29 LAB — PROTIME-INR
INR: 1.06
Prothrombin Time: 13.8 seconds (ref 11.4–15.2)

## 2016-05-29 LAB — I-STAT TROPONIN, ED: TROPONIN I, POC: 0.07 ng/mL (ref 0.00–0.08)

## 2016-05-29 LAB — POTASSIUM: POTASSIUM: 4.5 mmol/L (ref 3.5–5.1)

## 2016-05-29 LAB — CBG MONITORING, ED: Glucose-Capillary: 102 mg/dL — ABNORMAL HIGH (ref 65–99)

## 2016-05-29 LAB — APTT: APTT: 29 s (ref 24–36)

## 2016-05-29 NOTE — Consult Note (Signed)
Neurology Consultation Reason for Consult: Right-sided weakness Referring Physician: Roselyn Bering  CC: Right-sided weakness  History is obtained from: Family, caregiver  HPI: [name] is a 82 y.o. female with a history of severe dementia who at baseline is wheelchair bound and is able to communicate only some, sometimes gibberish at baseline. She presents today with right-sided weakness that started earlier today. Her caregiver states that she went to bed around 11:30 AM, and when she awoke around 2:50 she was lethargic, and not acting right. When she went to give her her medicines around 3:30 PM, she notes that she was having some right-sided weakness. When her daughter arrived that evening, she realized that there was something going on and called 911.   LKW: 11:30 AM tpa given?: no, out of window    ROS: Unable to obtain due to altered mental status.   Past Medical History:  Diagnosis Date  . Arthritis   . Atrial fibrillation (HCC)   . CAD (coronary artery disease)    a. LHC (8/14):  prox to mid LAD 80, pRCA 30, pPDA 30-40, EF 50%  . CHF (congestive heart failure) (HCC)   . Chronic combined systolic and diastolic CHF (congestive heart failure) (HCC) 05/20/2013   a. Echo (8/14):  mild LVH, EF 35%, diff HK, mild MR. EF 50% by cath. b. Echo 03/2014: EF 40-45%.  . CKD (chronic kidney disease), stage III   . Dementia   . Dementia   . Hypertension   . Hyponatremia   . NICM (nonischemic cardiomyopathy) (HCC)   . Osteoporosis   . Overactive bladder   . Permanent atrial fibrillation (HCC)    not a coumadin candidate     Family History  Problem Relation Age of Onset  . Depression    . Heart attack    . Coronary artery disease    . Coronary artery disease Mother 20  . Heart attack Son 62     Social History:  reports that she has never smoked. She has never used smokeless tobacco. She reports that she does not drink alcohol or use drugs.   Exam: Current vital signs: BP  131/73   Pulse 65   Temp 97.4 F (36.3 C) (Oral)   Resp 19   SpO2 99%  Vital signs in last 24 hours: Temp:  [97.4 F (36.3 C)] 97.4 F (36.3 C) (08/28 1925) Pulse Rate:  [65-79] 65 (08/28 2000) Resp:  [16-20] 19 (08/28 2000) BP: (113-131)/(72-79) 131/73 (08/28 2000) SpO2:  [98 %-99 %] 99 % (08/28 2000)   Physical Exam  Constitutional: Appears elderly Psych: does not speak Eyes: No scleral injection HENT: No OP obstrucion Head: Normocephalic.  Cardiovascular: Normal rate Respiratory: Effort normal  GI: Soft.  No distension. There is no tenderness.  Skin: WDI  Neuro: Mental Status: Patient is awake, but does not clearly follow commands. When I ask her to stick out her tongue, she opens her mouth. Cranial Nerves: II: does not clearly blink to threat from either direction. Pupils are equal, round, and reactive to light.   III,IV, VI: eyes are slightly dysconjugate, with the right looking slightly outward compared to left. Does not cross midline with either eye towards the right.  V, VII: blinks to eyelid stim bilaterally VIII, X, XI, XII: Unable to assess secondary to patient's altered mental status.  Motor: Flaccid right hemiparesis. Normal strength on the left Sensory: Does not react as much on the right as the left.  Cerebellar: no clear ataxia  on the left.    I have reviewed labs in epic and the results pertinent to this consultation are: Chem 8(repeat) unremarkable.   I have reviewed the images obtained: CT head- no acute findigns  Impression: 82 yo F with likely large left MCA stroke. She is outside of the tpa window. I discussed interventions with the daughter who agrees that she would not want any type of aggressive procedure. She is in fact considering taking her home with hospice from the ED.   I think that this would be a lot to arrange for amount of time, and I advised that they consider admission to help make sure that everything that needs to be arranged  has been arranged and he get in touch with the palliative care team. Clearly the daughter's need towards comfort only approach.  Recommendations: 1) I would hold off on any evaluations(e.g. MRI brain) pending decision by family to pursue aggressive care or comfort only measures. 2) asa 325mg  or PR 300mg  3) stroke team to follow.    Ritta SlotMcNeill Kj Imbert, MD [facility] [phone]  If 7pm- 7am, please page neurology on call as listed in AMION.

## 2016-05-29 NOTE — ED Provider Notes (Signed)
[facility]EMER[facility]ENCY DEPT Provider Note   CSN: [id number] Arrival date & time: 05/29/16  [id number]   An emergency department physician performed an initial assessment on this suspected stroke patient at 1905.  History   Chief Complaint Chief Complaint  Patient presents with  . Code Stroke    HPI [name] is a 82 y.o. female.  HPI The patient presented to the emergency room as an acute stroke. Patient has a history of dementia and several medical problems at baseline. She has 24-hour home health care. The patient's daughter is a Therapist, musichospice nurse. Family noticed the patient had a facial droop and right-sided weakness. EMS activated the patient has a code stroke. She was evaluated by the stroke team in the emergency room. Past Medical History:  Diagnosis Date  . Arthritis   . Atrial fibrillation (HCC)   . CAD (coronary artery disease)    a. LHC (8/14):  prox to mid LAD 80, pRCA 30, pPDA 30-40, EF 50%  . CHF (congestive heart failure) (HCC)   . Chronic combined systolic and diastolic CHF (congestive heart failure) (HCC) 05/20/2013   a. Echo (8/14):  mild LVH, EF 35%, diff HK, mild MR. EF 50% by cath. b. Echo 03/2014: EF 40-45%.  . CKD (chronic kidney disease), stage III   . Dementia   . Dementia   . Hypertension   . Hyponatremia   . NICM (nonischemic cardiomyopathy) (HCC)   . Osteoporosis   . Overactive bladder   . Permanent atrial fibrillation (HCC)    not a coumadin candidate    Patient Active Problem List   Diagnosis Date Noted  . History of bradycardia Coreg stopped Dec 2015 09/11/2014  . History of transient ischemic attack (TIA) 09/09/2014  . Malnutrition (HCC) 05/27/2013  . CAD- 80% LAD Aug 2014- med Rx 05/23/2013  . Nonischemic cardiomyopathy   . Permanent atrial fibrillation (HCC) 05/20/2013  . Acute on chronic combined systolic and diastolic CHF, NYHA class 2 (HCC) 05/20/2013  . Dementia-not felt to be a candidate for anticoagulation 05/20/2013  . Essential hypertension  03/01/2007  . Osteoarthritis 03/01/2007  . Osteoporosis 03/01/2007    Past Surgical History:  Procedure Laterality Date  . ABDOMINAL HYSTERECTOMY     TAH BSO  . LEFT HEART CATHETERIZATION WITH CORONARY ANGIOGRAM N/A 05/22/2013   Procedure: LEFT HEART CATHETERIZATION WITH CORONARY ANGIOGRAM;  Surgeon: Tonny BollmanMichael Cooper, MD;  [facility];  Service: Cardiovascular;  Laterality: N/A;  . SPINE SURGERY  1980   Lumbar surgery    OB History    No data available       Home Medications    Prior to Admission medications   Medication Sig Start Date End Date Taking? Authorizing Provider  acetaminophen (TYLENOL) 500 MG tablet Take 250 mg by mouth every 6 (six) hours as needed for moderate pain.    Historical Provider, MD  albuterol (PROAIR HFA) 108 (90 BASE) MCG/ACT inhaler INHALE 2 PUFFS INTO LUNGS EVERY 6 HOURS AS NEEDED FOR WHEEZING OR SHORTNESS OF BREATH 09/14/15   Grayling CongressYvonne R Lowne Chase, DO  aspirin EC 81 MG EC tablet Take 1 tablet (81 mg total) by mouth daily. 05/29/13   Jeralyn BennettEzequiel Zamora, MD  clonazePAM (KLONOPIN) 0.5 MG tablet TAKE 1 TABLET BY MOUTH THREE TIMES DAILY AS NEEDED FOR ANXIETY OR SLEEP 04/10/16   Lelon PerlaYvonne R Lowne Chase, DO  diclofenac sodium (VOLTAREN) 1 % GEL APPLY 2 GRAMS TO THE AFFECTED AREA FOUR TIMES DAILY 10/05/15   Donato SchultzYvonne R Lowne Chase, DO  DIGOX 125 MCG tablet TAKE 1 TABLET(125 MCG) BY MOUTH DAILY 03/02/16   Grayling Congress Lowne Chase, DO  donepezil (ARICEPT) 5 MG tablet TAKE 1 TABLET BY MOUTH EVERY NIGHT AT BEDTIME 11/08/15   Lelon Perla [place], DO  FLOVENT [facility] 110 MCG/ACT inhaler INHALE 2 PUFFS TWICE DAILY 04/06/16   Esperanza Richters, PA-C  furosemide (LASIX) 40 MG tablet Take 120 mg twice daily on Mon, Wed,Fri. Take 80 mg twice daily other days. 03/08/16   Kathleene Hazel, MD  furosemide (LASIX) 40 MG tablet TAKE 3 TABLETS BY MOUTH TWICE DAILY ON MONDAY, WEDNESDAY, AND FRIDAY THEN TAKE 2 TABLETS TWICE DAILY ON ALL OTHER DAYS AS DIRECTED. 03/08/16   Kathleene Hazel, MD    nitroGLYCERIN (NITROSTAT) 0.4 MG SL tablet Place 1 tablet (0.4 mg total) under the tongue every 5 (five) minutes as needed for chest pain. 01/24/16   Raeford Razor, MD  oxybutynin (DITROPAN-XL) 5 MG 24 hr tablet Take 1 tablet (5 mg total) by mouth daily. 11/15/15   Lelon Perla Chase, DO  polyethylene glycol powder (MIRALAX) powder Take 17 g by mouth daily.     Historical Provider, MD  potassium chloride SA (K-DUR,KLOR-CON) 20 MEQ tablet Take 20 meq by mouth twice daily. Take extra 20 meq on Mon, Wed and Fri. 03/08/16   Kathleene Hazel, MD  QUEtiapine (SEROQUEL) 100 MG tablet TAKE 1 TABLET BY MOUTH EVERY NIGHT AT BEDTIME 02/03/16   Donato Schultz, DO    Family History Family History  Problem Relation Age of Onset  . Depression    . Heart attack    . Coronary artery disease    . Coronary artery disease Mother 4  . Heart attack Son 50    Social History Social History  Substance Use Topics  . Smoking status: Never Smoker  . Smokeless tobacco: Never Used  . Alcohol use No     Allergies   Review of patient's allergies indicates no known allergies.   Review of Systems Review of Systems  Unable to perform ROS: Acuity of condition     Physical Exam Updated Vital Signs BP 131/73   Pulse 65   Temp 97.4 F (36.3 C) (Oral)   Resp 19   SpO2 99%   Physical Exam  Constitutional: No distress.  Frail, elderly  HENT:  Head: Normocephalic and atraumatic.  Right Ear: External ear normal.  Left Ear: External ear normal.  Eyes: Conjunctivae are normal. Right eye exhibits no discharge. Left eye exhibits no discharge. No scleral icterus.  Neck: Neck supple. No tracheal deviation present.  Cardiovascular: Normal rate, regular rhythm and normal heart sounds.   Pulmonary/Chest: Effort normal. No stridor. She has no wheezes. She has no rales.  Musculoskeletal: She exhibits no edema.  Neurological: Cranial nerve deficit: no gross deficits.  Patient does not respond to  commands, please see stroke team note for full neurologic exam  Skin: Skin is warm and dry. No rash noted.  Psychiatric: She has a normal mood and affect.  Nursing note and vitals reviewed.    ED Treatments / Results  Labs (all labs ordered are listed, but only abnormal results are displayed) Labs Reviewed  CBC - Abnormal; Notable for the following:       Result Value   WBC 11.2 (*)    Hemoglobin 15.1 (*)    HCT 46.5 (*)    All other components within normal limits  DIFFERENTIAL - Abnormal; Notable for the following:  Monocytes Absolute 1.3 (*)    All other components within normal limits  COMPREHENSIVE METABOLIC PANEL - Abnormal; Notable for the following:    Potassium 7.4 (*)    Glucose, Bld 105 (*)    Creatinine, Ser 1.09 (*)    Calcium 8.8 (*)    Total Protein 5.9 (*)    Albumin 3.1 (*)    AST 68 (*)    Total Bilirubin 1.5 (*)    GFR calc non Af Amer 45 (*)    GFR calc Af Amer 52 (*)    All other components within normal limits  CBG MONITORING, ED - Abnormal; Notable for the following:    Glucose-Capillary 102 (*)    All other components within normal limits  I-STAT CHEM 8, ED - Abnormal; Notable for the following:    Potassium 7.4 (*)    BUN 26 (*)    Glucose, Bld 101 (*)    Calcium, Ion 0.98 (*)    Hemoglobin 15.6 (*)    All other components within normal limits  I-STAT CHEM 8, ED - Abnormal; Notable for the following:    Glucose, Bld 101 (*)    Calcium, Ion 1.00 (*)    Hemoglobin 15.3 (*)    All other components within normal limits  PROTIME-INR  APTT  POTASSIUM  I-STAT TROPOININ, ED    EKG  EKG Interpretation  Date/Time:  Monday May 29 2016 19:24:57 EDT Ventricular Rate:  90 PR Interval:    QRS Duration: 110 QT Interval:  393 QTC Calculation: 481 R Axis:   -33 Text Interpretation:  Atrial fibrillation Incomplete left bundle branch block LVH with secondary repolarization abnormality Since last tracing rate faster Confirmed by Lupe Bonner  MD-J, Eris Hannan  ([id number]) on 05/29/2016 7:33:51 PM       Radiology Ct Head Code Stroke W/o Cm  Result Date: 05/29/2016 CLINICAL DATA:  Code stroke. Right-sided true and weakness occurring at 5 p.m. EXAM: CT HEAD WITHOUT CONTRAST TECHNIQUE: Contiguous axial images were obtained from the base of the skull through the vertex without intravenous contrast. COMPARISON:  MRI brain 09/09/2014.  CT head 09/09/2014. FINDINGS: Diffuse cerebral atrophy. Ventricular dilatation consistent with central atrophy. Low-attenuation changes in the deep white matter consistent small vessel ischemia. No change in parenchymal pattern since previous study. No mass effect or midline shift. No abnormal extra-axial fluid collections. Gray-white matter junctions are distinct. Basal cisterns are not effaced. No evidence of acute intracranial hemorrhage. No depressed skull fractures. Partial opacification of bilateral ethmoid air cells and sphenoid sinuses. Mastoid air cells are not opacified. Vascular calcifications. IMPRESSION: No acute intracranial abnormalities. Prominent atrophy and small vessel ischemic changes. These results were called by telephone at the time of interpretation on 05/29/2016 at 7:56 pm to Dr. Amada Jupiter, who verbally acknowledged these results. Electronically Signed   By: Burman Nieves M.D.   On: 05/29/2016 20:02    Procedures Procedures (including critical care time)  Medications Ordered in ED Medications - No data to display   Initial Impression / Assessment and Plan / ED Course  I have reviewed the triage vital signs and the nursing notes.  Pertinent labs & imaging results that were available during my care of the patient were reviewed by me and considered in my medical decision making (see chart for details).  Clinical Course    Patient was seen by Dr. Amada Jupiter, neurology.  He recommended admission to the hospital for further evaluation. The family however wants to take the patient home and place her  on  hospice care. She is in a very debilitated state at baseline. They understand the nature of her illness and disease.  They have 24-hour home health and the patient's daughter is a Therapist, music. The family are all in agreement.  Final Clinical Impressions(s) / ED Diagnoses   Final diagnoses:  Cerebral infarction due to unspecified mechanism      Linwood Dibbles, MD 05/29/16 2112

## 2016-05-29 NOTE — ED Notes (Signed)
Pt's family called out and stated would like to take patient home

## 2016-05-29 NOTE — ED Triage Notes (Signed)
Per EMS:  Pt presents to ED for assessment after she began to have facial droop and right sided weakness.  Pt has had increased lethargy x 24 hours.  PT has a hx of dementia with gibberish speech.  GCS 14, pupils equal.  EMS noted a right gave of the right eye.  Pt is not on blood thinners.

## 2016-05-29 NOTE — ED Triage Notes (Signed)
Per EMS:  Pt presents to ED

## 2016-05-30 ENCOUNTER — Telehealth: Payer: Self-pay | Admitting: Family Medicine

## 2016-05-30 LAB — I-STAT CHEM 8, ED
BUN: 26 mg/dL — ABNORMAL HIGH (ref 6–20)
CALCIUM ION: 0.98 mmol/L — AB (ref 1.12–1.23)
CHLORIDE: 104 mmol/L (ref 101–111)
Creatinine, Ser: 1 mg/dL (ref 0.44–1.00)
Glucose, Bld: 101 mg/dL — ABNORMAL HIGH (ref 65–99)
HEMATOCRIT: 46 % (ref 36.0–46.0)
Hemoglobin: 15.6 g/dL — ABNORMAL HIGH (ref 12.0–15.0)
POTASSIUM: 7.4 mmol/L — AB (ref 3.5–5.1)
SODIUM: 137 mmol/L (ref 135–145)
TCO2: 29 mmol/L (ref 0–100)

## 2016-05-30 NOTE — Telephone Encounter (Signed)
noted 

## 2016-05-30 NOTE — Telephone Encounter (Signed)
Caller name: [place] with [facility] Can be reached: [phone]  Reason for call: Called to notify you pt was enrolled in hospice services today.

## 2016-05-30 NOTE — Telephone Encounter (Signed)
I spoke with someone earlier today, I will route to PCP.    KP

## 2016-06-06 ENCOUNTER — Telehealth: Payer: Self-pay | Admitting: Family Medicine

## 2016-06-07 ENCOUNTER — Telehealth: Payer: Self-pay | Admitting: Family Medicine

## 2016-06-07 NOTE — Telephone Encounter (Signed)
[employer] called in to verify address to bring pt's death certificate.

## 2016-06-12 ENCOUNTER — Encounter: Payer: Self-pay | Admitting: Family Medicine

## 2016-06-14 ENCOUNTER — Telehealth: Payer: Self-pay

## 2016-06-14 NOTE — Telephone Encounter (Signed)
Received Hospice orders from 05/31/2016 through 06/05/2016. Forms signed and mailed back to Hospice with provided envelope. Copies of forms sent for scanning.

## 2016-06-15 ENCOUNTER — Telehealth: Payer: Self-pay

## 2016-06-15 NOTE — Telephone Encounter (Signed)
Received Plan of Care dated 05/30/2016 through 08/27/2016. Forms completed by PCP, and mailed back to Hospice in provided envelope. Copies sent for scanning.

## 2016-07-02 NOTE — Telephone Encounter (Signed)
Caller name:Stephaine Hyatt Relationship to patient:Hospice Can be reached:[phone] Pharmacy:  Reason for call:Patient has passed away. Time of death 3:10am Jun 27, 2016

## 2016-07-02 NOTE — Telephone Encounter (Signed)
FYI  KP

## 2016-07-02 DEATH — deceased

## 2016-12-04 IMAGING — DX DG CHEST 2V
2 series · 2 of 2 positions shown · non-contrast
Comparison: 01/21/2015

CLINICAL DATA: Increase shortness of breath with weakness and lower
extremity swelling. Decreased urine output.

EXAM:
CHEST  2 VIEW

[chest lat]
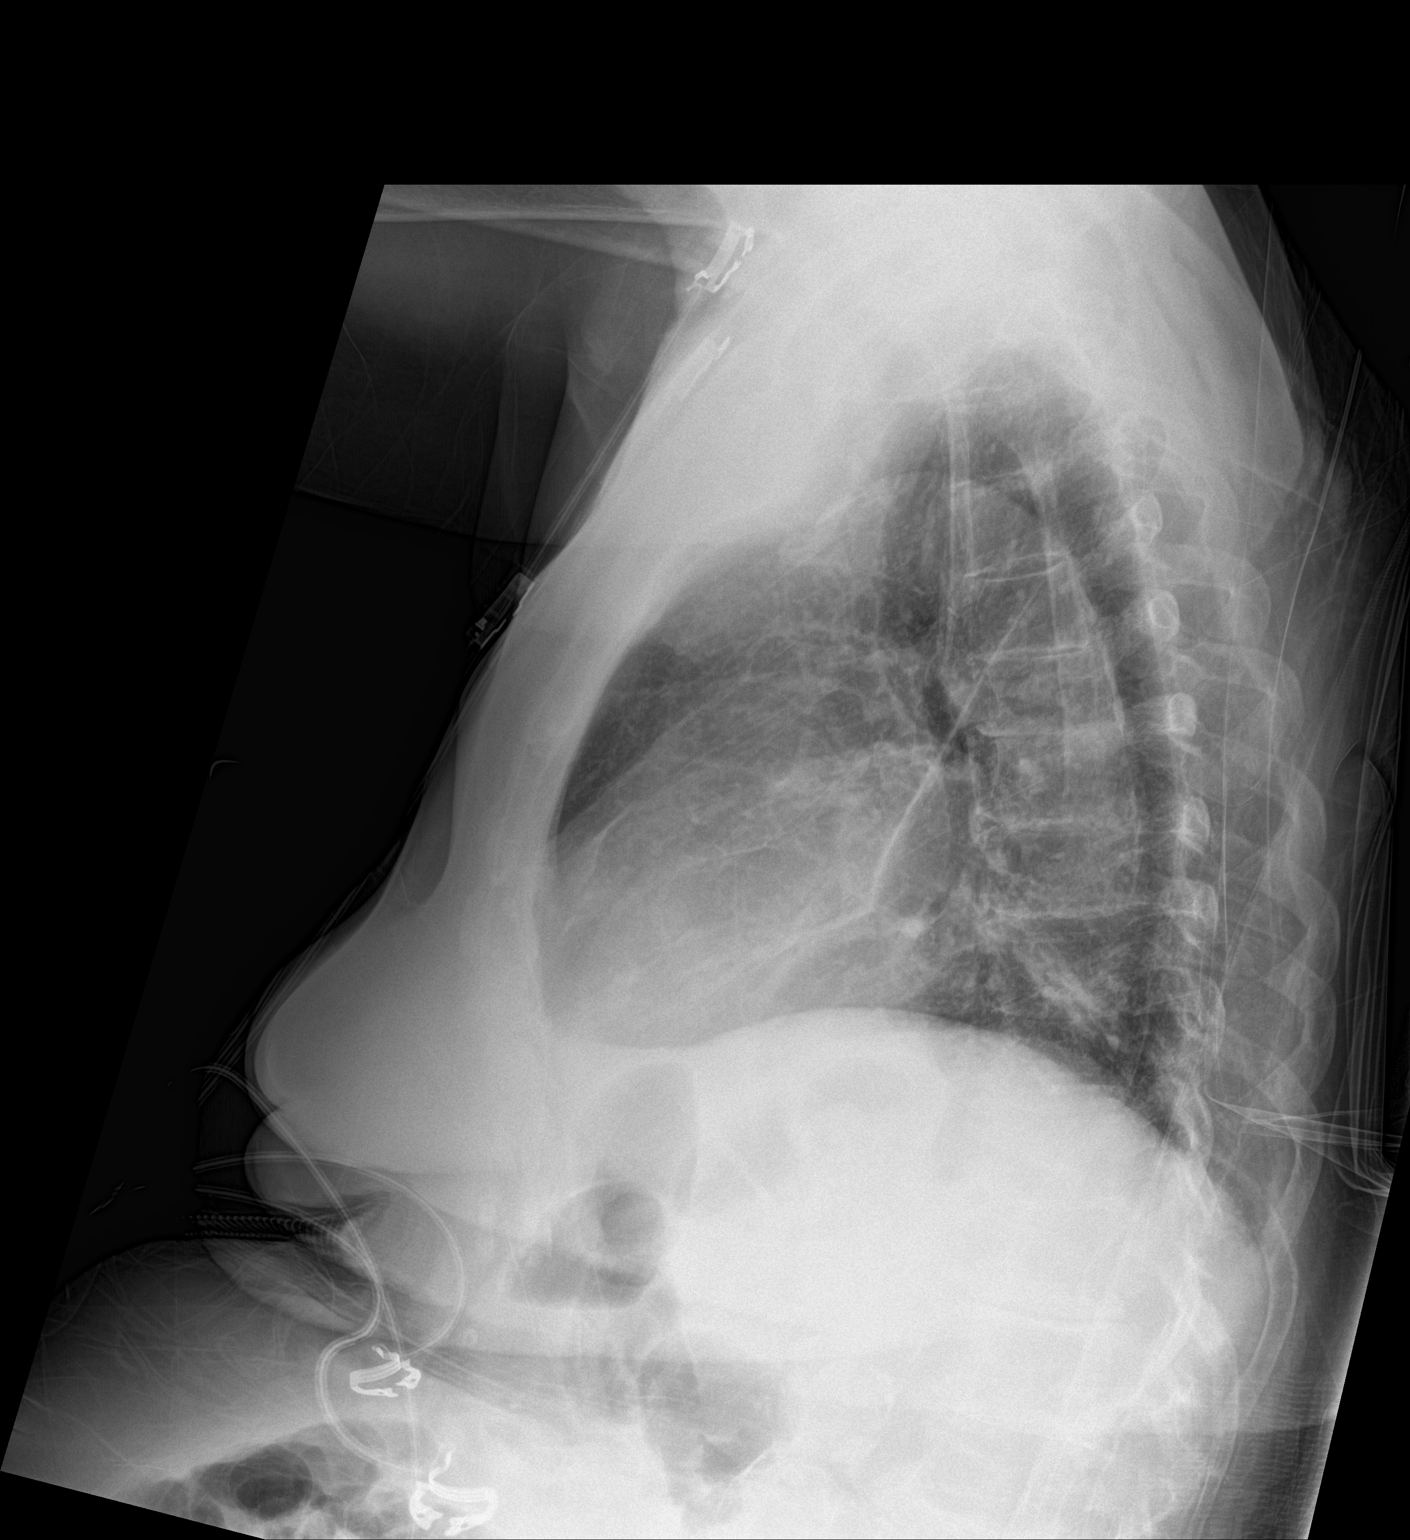

[chest ap strecther]
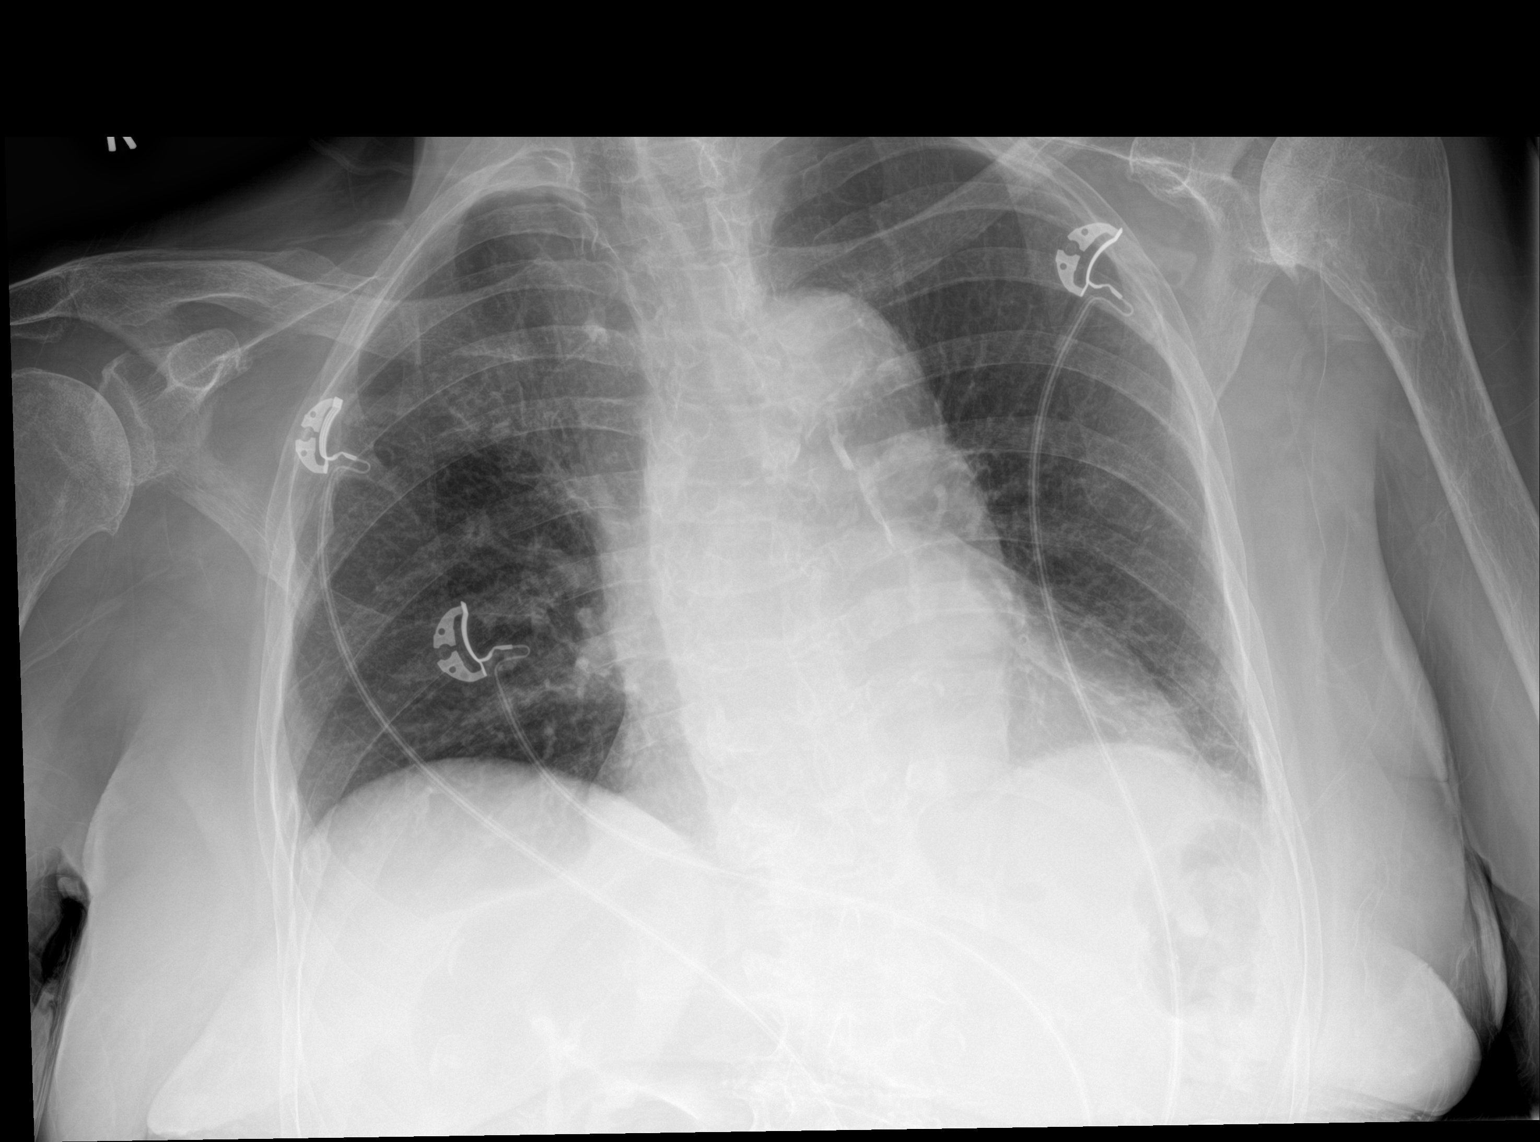

[2 of 2 positions shown; findings below may reference images not displayed]

FINDINGS: Patient is rotated to the right. Lungs are hypoinflated without
focal consolidation or effusion. There is mild stable cardiomegaly.
There is calcified plaque over the thoracoabdominal aorta. There are
degenerative changes of the spine with curvature of the
thoracolumbar spine convex left unchanged.
IMPRESSION: Hypoinflation without acute cardiopulmonary disease.

## 2019-09-24 NOTE — Telephone Encounter (Signed)
Created in error
